# Patient Record
Sex: Male | Born: 1955
Health system: Southern US, Community
[De-identification: ages and names within clinical notes are randomized; demographics above are authoritative.]

## PROBLEM LIST (undated history)

## (undated) DIAGNOSIS — G4730 Sleep apnea, unspecified: Secondary | ICD-10-CM

## (undated) DIAGNOSIS — I4891 Unspecified atrial fibrillation: Secondary | ICD-10-CM

## (undated) DIAGNOSIS — M1990 Unspecified osteoarthritis, unspecified site: Secondary | ICD-10-CM

## (undated) DIAGNOSIS — E119 Type 2 diabetes mellitus without complications: Secondary | ICD-10-CM

## (undated) DIAGNOSIS — I10 Essential (primary) hypertension: Secondary | ICD-10-CM

## (undated) DIAGNOSIS — T7840XA Allergy, unspecified, initial encounter: Secondary | ICD-10-CM

## (undated) DIAGNOSIS — E785 Hyperlipidemia, unspecified: Secondary | ICD-10-CM

## (undated) DIAGNOSIS — R011 Cardiac murmur, unspecified: Secondary | ICD-10-CM

## (undated) DIAGNOSIS — I4892 Unspecified atrial flutter: Secondary | ICD-10-CM

## (undated) HISTORY — DX: Sleep apnea, unspecified: G47.30

## (undated) HISTORY — DX: Type 2 diabetes mellitus without complications: E11.9

## (undated) HISTORY — DX: Unspecified atrial fibrillation: I48.91

## (undated) HISTORY — PX: POLYPECTOMY: SHX149

## (undated) HISTORY — DX: Cardiac murmur, unspecified: R01.1

## (undated) HISTORY — DX: Allergy, unspecified, initial encounter: T78.40XA

## (undated) HISTORY — DX: Unspecified atrial flutter: I48.92

## (undated) HISTORY — DX: Essential (primary) hypertension: I10

## (undated) HISTORY — DX: Unspecified osteoarthritis, unspecified site: M19.90

## (undated) HISTORY — DX: Hyperlipidemia, unspecified: E78.5

## (undated) HISTORY — PX: COLONOSCOPY: SHX174

---

## 2000-10-18 ENCOUNTER — Encounter: Payer: Self-pay | Admitting: *Deleted

## 2000-10-18 ENCOUNTER — Encounter: Admission: RE | Admit: 2000-10-18 | Discharge: 2000-10-18 | Payer: Self-pay | Admitting: *Deleted

## 2001-11-23 ENCOUNTER — Emergency Department (HOSPITAL_COMMUNITY): Admission: EM | Admit: 2001-11-23 | Discharge: 2001-11-24 | Payer: Self-pay | Admitting: Emergency Medicine

## 2001-11-24 ENCOUNTER — Encounter: Payer: Self-pay | Admitting: Emergency Medicine

## 2002-09-05 ENCOUNTER — Encounter: Payer: Self-pay | Admitting: *Deleted

## 2002-09-05 ENCOUNTER — Encounter: Admission: RE | Admit: 2002-09-05 | Discharge: 2002-09-05 | Payer: Self-pay | Admitting: *Deleted

## 2008-09-11 LAB — HM COLONOSCOPY

## 2010-06-19 ENCOUNTER — Emergency Department (HOSPITAL_COMMUNITY): Admission: EM | Admit: 2010-06-19 | Discharge: 2010-06-19 | Payer: Self-pay | Admitting: Emergency Medicine

## 2010-06-24 ENCOUNTER — Encounter: Admission: RE | Admit: 2010-06-24 | Discharge: 2010-06-24 | Payer: Self-pay | Admitting: Sports Medicine

## 2010-12-16 LAB — GLUCOSE, CAPILLARY: Glucose-Capillary: 156 mg/dL — ABNORMAL HIGH (ref 70–99)

## 2013-03-08 ENCOUNTER — Other Ambulatory Visit (INDEPENDENT_AMBULATORY_CARE_PROVIDER_SITE_OTHER): Payer: 59

## 2013-03-08 DIAGNOSIS — E785 Hyperlipidemia, unspecified: Secondary | ICD-10-CM

## 2013-03-08 DIAGNOSIS — E119 Type 2 diabetes mellitus without complications: Secondary | ICD-10-CM

## 2013-03-08 DIAGNOSIS — I10 Essential (primary) hypertension: Secondary | ICD-10-CM

## 2013-03-08 LAB — COMPLETE METABOLIC PANEL WITH GFR
ALT: 36 U/L (ref 0–53)
AST: 20 U/L (ref 0–37)
Alkaline Phosphatase: 53 U/L (ref 39–117)
CO2: 27 mEq/L (ref 19–32)
Creat: 0.82 mg/dL (ref 0.50–1.35)
GFR, Est African American: >89 mL/min
Sodium: 141 mEq/L (ref 135–145)
Total Bilirubin: 0.7 mg/dL (ref 0.3–1.2)
Total Protein: 6.4 g/dL (ref 6.0–8.3)

## 2013-03-08 LAB — LIPID PANEL
HDL: 33 mg/dL — ABNORMAL LOW (ref >39)
LDL Cholesterol: 66 mg/dL (ref 0–99)
Total CHOL/HDL Ratio: 3.5 Ratio
Triglycerides: 75 mg/dL (ref <150)
VLDL: 15 mg/dL (ref 0–40)

## 2013-03-08 LAB — HEMOGLOBIN A1C: Mean Plasma Glucose: 197 mg/dL — ABNORMAL HIGH (ref <117)

## 2013-03-14 ENCOUNTER — Ambulatory Visit (INDEPENDENT_AMBULATORY_CARE_PROVIDER_SITE_OTHER): Payer: 59 | Admitting: Family Medicine

## 2013-03-14 ENCOUNTER — Encounter: Payer: Self-pay | Admitting: Family Medicine

## 2013-03-14 VITALS — BP 118/80 | HR 68 | Temp 98.1°F | Resp 18 | Wt 254.0 lb

## 2013-03-14 DIAGNOSIS — E119 Type 2 diabetes mellitus without complications: Secondary | ICD-10-CM | POA: Insufficient documentation

## 2013-03-14 DIAGNOSIS — E785 Hyperlipidemia, unspecified: Secondary | ICD-10-CM

## 2013-03-14 DIAGNOSIS — IMO0001 Reserved for inherently not codable concepts without codable children: Secondary | ICD-10-CM

## 2013-03-14 DIAGNOSIS — I10 Essential (primary) hypertension: Secondary | ICD-10-CM

## 2013-03-14 MED ORDER — SITAGLIPTIN PHOSPHATE 100 MG PO TABS: 100.0000 mg | ORAL_TABLET | Freq: Every day | ORAL | Status: DC

## 2013-03-14 MED ORDER — CLOTRIMAZOLE-BETAMETHASONE 1-0.05 % EX CREA: TOPICAL_CREAM | Freq: Two times a day (BID) | CUTANEOUS | Status: DC

## 2013-03-14 NOTE — Progress Notes (Signed)
Subjective:    Patient ID: Todd Ellis, male    DOB: [birth year], 57 y.o.   MRN: [id number]  HPI Patient is here today for followup of his medical problems. Problem number one is diabetes mellitus he is currently on metformin 1000 mg by mouth twice a day his hemoglobin A1c has recently risen from 6.5-8.5.  He admits that he has been making for dietary choices lately. He also has not been exercising the way he should.    Problem #2 is hypertension. He is currently taking Norvasc 10 mg by mouth daily and Hyzaar 100/25 by mouth daily. He denies any chest pain, shortness of breath, dyspnea on exertion.   Problem #3 is hyperlipidemia. He is currently taking Crestor 20 mg by mouth daily he denies any myalgias or right upper quadrant pain. His labs are listed below: Lab on 03/08/2013  Component Date Value Range Status  . Cholesterol 03/08/2013 114  0 - 200 mg/dL Final   Comment: ATP III Classification:                                < 200        mg/dL        Desirable                               200 - 239     mg/dL        Borderline High                               >= 240        mg/dL        High                             . Triglycerides 03/08/2013 75  <150 mg/dL Final  . HDL 40/98/1191 33* >39 mg/dL Final  . Total CHOL/HDL Ratio 03/08/2013 3.5   Final  . VLDL 03/08/2013 15  0 - 40 mg/dL Final  . LDL Cholesterol 03/08/2013 66  0 - 99 mg/dL Final   Comment:                            Total Cholesterol/HDL Ratio:CHD Risk                                                 Coronary Heart Disease Risk Table                                                                 Men       Women                                   1/2 Average Risk  3.4        3.3                                       Average Risk              5.0        4.4                                    2X Average Risk              9.6        7.1                                    3X Average Risk             23.4        11.0                          Use the calculated Patient Ratio above and the CHD Risk table                           to determine the patient's CHD Risk.                          ATP III Classification (LDL):                                < 100        mg/dL         Optimal                               100 - 129     mg/dL         Near or Above Optimal                               130 - 159     mg/dL         Borderline High                               160 - 189     mg/dL         High                                > 190        mg/dL         Very High                             . Sodium 03/08/2013 141  135 - 145 mEq/L Final  . Potassium 03/08/2013 4.5  3.5 - 5.3 mEq/L Final  . Chloride 03/08/2013 103  96 - 112 mEq/L Final  . CO2 03/08/2013 27  19 -  32 mEq/L Final  . Glucose, Bld 03/08/2013 155* 70 - 99 mg/dL Final  . BUN 60/45/4098 13  6 - 23 mg/dL Final  . Creat 11/91/4782 0.82  0.50 - 1.35 mg/dL Final  . Total Bilirubin 03/08/2013 0.7  0.3 - 1.2 mg/dL Final  . Alkaline Phosphatase 03/08/2013 53  39 - 117 U/L Final  . AST 03/08/2013 20  0 - 37 U/L Final  . ALT 03/08/2013 36  0 - 53 U/L Final  . Total Protein 03/08/2013 6.4  6.0 - 8.3 g/dL Final  . Albumin 95/62/1308 4.6  3.5 - 5.2 g/dL Final  . Calcium 65/78/4696 9.6  8.4 - 10.5 mg/dL Final  . GFR, Est African American 03/08/2013 >89   Final  . GFR, Est Non African American 03/08/2013 >89   Final   Comment:                            The estimated GFR is a calculation valid for adults (>=32 years old)                          that uses the CKD-EPI algorithm to adjust for age and sex. It is                            not to be used for children, pregnant women, hospitalized patients,                             patients on dialysis, or with rapidly changing kidney function.                          According to the NKDEP, eGFR >89 is normal, 60-89 shows mild                          impairment, 30-59 shows moderate impairment,  15-29 shows severe                          impairment and <15 is ESRD.                             Marland Kitchen Hemoglobin A1C 03/08/2013 8.5* <5.7 % Final   Comment:                                                                                                 According to the ADA Clinical Practice Recommendations for 2011, when                          HbA1c is used as a screening test:                                                       >=  6.5%   Diagnostic of Diabetes Mellitus                                     (if abnormal result is confirmed)                                                     5.7-6.4%   Increased risk of developing Diabetes Mellitus                                                     References:Diagnosis and Classification of Diabetes Mellitus,Diabetes                          Care,2011,34(Suppl 1):S62-S69 and Standards of Medical Care in                                  Diabetes - 2011,Diabetes Care,2011,34 (Suppl 1):S11-S61.                             . Mean Plasma Glucose 03/08/2013 197* <117 mg/dL Final   Past Medical History  Diagnosis Date  . Hyperlipidemia   . Hypertension   . Diabetes mellitus without complication    No current outpatient prescriptions on file prior to visit.   No current facility-administered medications on file prior to visit.   Allergies  Allergen Reactions  . Lipitor (Atorvastatin)     Myalgias  . Penicillins Rash      Review of Systems  All other systems reviewed and are negative.       Objective:   Physical Exam  Vitals reviewed. Constitutional: He appears well-developed and well-nourished.  Neck: Neck supple. No JVD present. No thyromegaly present.  Cardiovascular: Normal rate and regular rhythm.   Murmur heard. Pulmonary/Chest: Effort normal and breath sounds normal. No respiratory distress. He has no wheezes. He has no rales. He exhibits no tenderness.  Abdominal: Soft. Bowel sounds are normal. He exhibits no  distension and no mass. There is no tenderness. There is no rebound and no guarding.  Lymphadenopathy:    He has no cervical adenopathy.  diabetic foot exam is performed.        Assessment & Plan:   1. Other and unspecified hyperlipidemia Cholesterol is currently well controlled. Recommended increasing aerobic exercise and try to lose 10-15 lbs.  2. Unspecified essential hypertension Blood pressures currently well controlled. Continue current medications at present dosages. 3. Type II or unspecified type diabetes mellitus without mention of complication, uncontrolled Begin Januvia 100 mg by mouth daily. Recheck hemoglobin A1c in 3 months. Continue aspirin 81 mg by mouth daily. Recommended diabetic eye exam.

## 2013-05-12 ENCOUNTER — Other Ambulatory Visit: Payer: Self-pay | Admitting: Family Medicine

## 2013-05-13 NOTE — Telephone Encounter (Signed)
Meds refilled.

## 2013-05-24 ENCOUNTER — Other Ambulatory Visit: Payer: Self-pay | Admitting: Family Medicine

## 2013-05-24 DIAGNOSIS — R9431 Abnormal electrocardiogram [ECG] [EKG]: Secondary | ICD-10-CM

## 2013-05-24 DIAGNOSIS — I10 Essential (primary) hypertension: Secondary | ICD-10-CM

## 2013-05-24 DIAGNOSIS — E119 Type 2 diabetes mellitus without complications: Secondary | ICD-10-CM

## 2013-05-24 DIAGNOSIS — E785 Hyperlipidemia, unspecified: Secondary | ICD-10-CM

## 2013-08-06 ENCOUNTER — Other Ambulatory Visit: Payer: 59

## 2013-08-06 DIAGNOSIS — Z79899 Other long term (current) drug therapy: Secondary | ICD-10-CM

## 2013-08-06 DIAGNOSIS — E785 Hyperlipidemia, unspecified: Secondary | ICD-10-CM

## 2013-08-06 DIAGNOSIS — E119 Type 2 diabetes mellitus without complications: Secondary | ICD-10-CM

## 2013-08-06 DIAGNOSIS — I10 Essential (primary) hypertension: Secondary | ICD-10-CM

## 2013-08-06 LAB — COMPLETE METABOLIC PANEL WITH GFR
Alkaline Phosphatase: 56 U/L (ref 39–117)
BUN: 12 mg/dL (ref 6–23)
Creat: 0.78 mg/dL (ref 0.50–1.35)
GFR, Est African American: >89 mL/min
GFR, Est Non African American: >89 mL/min
Glucose, Bld: 134 mg/dL — ABNORMAL HIGH (ref 70–99)
Total Bilirubin: 0.5 mg/dL (ref 0.3–1.2)

## 2013-08-06 LAB — LIPID PANEL
HDL: 42 mg/dL (ref >39)
Total CHOL/HDL Ratio: 3 Ratio
Triglycerides: 57 mg/dL (ref <150)

## 2013-08-06 LAB — CBC WITH DIFFERENTIAL/PLATELET
Basophils Relative: 0 % (ref 0–1)
Eosinophils Absolute: 0.1 10*3/uL (ref 0.0–0.7)
Eosinophils Relative: 1 % (ref 0–5)
HCT: 43.9 % (ref 39.0–52.0)
Hemoglobin: 15.1 g/dL (ref 13.0–17.0)
MCH: 29.8 pg (ref 26.0–34.0)
MCHC: 34.4 g/dL (ref 30.0–36.0)
MCV: 86.8 fL (ref 78.0–100.0)
Monocytes Absolute: 0.6 10*3/uL (ref 0.1–1.0)
Monocytes Relative: 10 % (ref 3–12)
Neutrophils Relative %: 66 % (ref 43–77)

## 2013-08-06 LAB — HEMOGLOBIN A1C: Hgb A1c MFr Bld: 7.1 % — ABNORMAL HIGH (ref <5.7)

## 2013-08-07 ENCOUNTER — Other Ambulatory Visit: Payer: Self-pay | Admitting: Family Medicine

## 2013-08-07 MED ORDER — LOSARTAN POTASSIUM-HCTZ 100-25 MG PO TABS: 1.0000 | ORAL_TABLET | Freq: Every day | ORAL | Status: DC

## 2013-08-07 NOTE — Telephone Encounter (Signed)
Rx Refilled  

## 2013-09-20 ENCOUNTER — Encounter: Payer: Self-pay | Admitting: Family Medicine

## 2013-11-05 ENCOUNTER — Other Ambulatory Visit: Payer: Self-pay | Admitting: Family Medicine

## 2013-11-05 MED ORDER — ROSUVASTATIN CALCIUM 40 MG PO TABS: 20.0000 mg | ORAL_TABLET | Freq: Every day | ORAL | Status: DC

## 2013-11-05 NOTE — Telephone Encounter (Signed)
Rx Refilled  

## 2013-11-07 ENCOUNTER — Telehealth: Payer: Self-pay | Admitting: Family Medicine

## 2013-11-07 NOTE — Telephone Encounter (Signed)
Pt states he is needing an OV for his Losartan to be refilled and he is wanting to know why since all of his other meds were filled without needing a OV Call back number [phone]

## 2013-11-08 MED ORDER — LOSARTAN POTASSIUM-HCTZ 100-25 MG PO TABS: 1.0000 | ORAL_TABLET | Freq: Every day | ORAL | Status: DC

## 2013-11-08 NOTE — Telephone Encounter (Signed)
Not sure why unless pharmacy sent to wrong md. I have not seen any rx for him nor is it in the computer that we received a request. Refilled medication and pt will require ov and bw before further refills. Pt is aware per vm.

## 2013-11-11 ENCOUNTER — Other Ambulatory Visit: Payer: Self-pay | Admitting: Family Medicine

## 2013-11-11 MED ORDER — LOSARTAN POTASSIUM-HCTZ 100-25 MG PO TABS: 1.0000 | ORAL_TABLET | Freq: Every day | ORAL | Status: DC

## 2013-11-11 NOTE — Telephone Encounter (Signed)
Rx Refilled  

## 2013-12-13 LAB — HM DIABETES EYE EXAM

## 2014-01-24 ENCOUNTER — Ambulatory Visit (INDEPENDENT_AMBULATORY_CARE_PROVIDER_SITE_OTHER): Payer: 59 | Admitting: Family Medicine

## 2014-01-24 ENCOUNTER — Other Ambulatory Visit: Payer: 59

## 2014-01-24 ENCOUNTER — Encounter: Payer: Self-pay | Admitting: Family Medicine

## 2014-01-24 VITALS — BP 130/76 | HR 78 | Temp 97.0°F | Resp 18 | Ht 76.0 in | Wt 255.0 lb

## 2014-01-24 DIAGNOSIS — J329 Chronic sinusitis, unspecified: Secondary | ICD-10-CM

## 2014-01-24 DIAGNOSIS — E785 Hyperlipidemia, unspecified: Secondary | ICD-10-CM

## 2014-01-24 DIAGNOSIS — Z9109 Other allergy status, other than to drugs and biological substances: Secondary | ICD-10-CM

## 2014-01-24 DIAGNOSIS — I10 Essential (primary) hypertension: Secondary | ICD-10-CM

## 2014-01-24 DIAGNOSIS — E119 Type 2 diabetes mellitus without complications: Secondary | ICD-10-CM

## 2014-01-24 DIAGNOSIS — Z79899 Other long term (current) drug therapy: Secondary | ICD-10-CM

## 2014-01-24 LAB — COMPLETE METABOLIC PANEL WITH GFR
ALK PHOS: 61 U/L (ref 39–117)
ALT: 36 U/L (ref 0–53)
AST: 17 U/L (ref 0–37)
Albumin: 4.2 g/dL (ref 3.5–5.2)
BUN: 13 mg/dL (ref 6–23)
CALCIUM: 9.4 mg/dL (ref 8.4–10.5)
CHLORIDE: 101 meq/L (ref 96–112)
CO2: 26 mEq/L (ref 19–32)
Creat: 0.77 mg/dL (ref 0.50–1.35)
GFR, Est African American: >89 mL/min
GFR, Est Non African American: >89 mL/min
Glucose, Bld: 140 mg/dL — ABNORMAL HIGH (ref 70–99)
POTASSIUM: 4.3 meq/L (ref 3.5–5.3)
Sodium: 139 mEq/L (ref 135–145)
Total Bilirubin: 0.6 mg/dL (ref 0.2–1.2)
Total Protein: 6.5 g/dL (ref 6.0–8.3)

## 2014-01-24 LAB — LIPID PANEL
Cholesterol: 127 mg/dL (ref 0–200)
HDL: 36 mg/dL — ABNORMAL LOW (ref >39)
LDL Cholesterol: 78 mg/dL (ref 0–99)
Total CHOL/HDL Ratio: 3.5 Ratio
Triglycerides: 65 mg/dL (ref <150)
VLDL: 13 mg/dL (ref 0–40)

## 2014-01-24 LAB — HEMOGLOBIN A1C
Hgb A1c MFr Bld: 7.9 % — ABNORMAL HIGH (ref <5.7)
Mean Plasma Glucose: 180 mg/dL — ABNORMAL HIGH (ref <117)

## 2014-01-24 MED ORDER — SITAGLIPTIN PHOSPHATE 100 MG PO TABS: ORAL_TABLET | ORAL | Status: DC

## 2014-01-24 MED ORDER — AZITHROMYCIN 250 MG PO TABS: ORAL_TABLET | ORAL | Status: DC

## 2014-01-24 MED ORDER — PREDNISONE 20 MG PO TABS: ORAL_TABLET | ORAL | Status: DC

## 2014-01-24 MED ORDER — ROSUVASTATIN CALCIUM 40 MG PO TABS: 20.0000 mg | ORAL_TABLET | Freq: Every day | ORAL | Status: DC

## 2014-01-24 MED ORDER — FLUTICASONE PROPIONATE 50 MCG/ACT NA SUSP: NASAL | Status: DC

## 2014-01-24 MED ORDER — METFORMIN HCL 1000 MG PO TABS: ORAL_TABLET | ORAL | Status: DC

## 2014-01-24 MED ORDER — AMLODIPINE BESYLATE 10 MG PO TABS: ORAL_TABLET | ORAL | Status: DC

## 2014-01-24 MED ORDER — LOSARTAN POTASSIUM-HCTZ 100-25 MG PO TABS: 1.0000 | ORAL_TABLET | Freq: Every day | ORAL | Status: DC

## 2014-01-24 NOTE — Progress Notes (Signed)
   Subjective:    Patient ID: Todd Ellis, male    DOB: 1956-03-11, 58 y.o.   MRN: [id number]  HPI  Patient has had bilateral sinus pressure for several weeks unresponsive to Flonase and over-the-counter allergy medicine. He denies any fevers or chills. He denies any sinus pain. He does have occasional pain in his teeth. He also has occasional headache. He denies any cough or shortness of breath worse throat otalgia Past Medical History  Diagnosis Date  . Hyperlipidemia   . Hypertension   . Diabetes mellitus without complication    Current Outpatient Prescriptions on File Prior to Visit  Medication Sig Dispense Refill  . aspirin 81 MG tablet Take 81 mg by mouth daily.      . clotrimazole-betamethasone (LOTRISONE) cream Apply topically 2 (two) times daily. For 7 days  30 g  2   No current facility-administered medications on file prior to visit.   Allergies  Allergen Reactions  . Lipitor [Atorvastatin]     Myalgias  . Penicillins Rash   History   Social History  . Marital Status: Single    Spouse Name: N/A    Number of Children: N/A  . Years of Education: N/A   Occupational History  . Not on file.   Social History Main Topics  . Smoking status: Never Smoker   . Smokeless tobacco: Former Systems developer    Types: Chew     Comment: quit 2002  . Alcohol Use: Yes     Comment: Occasional  . Drug Use: No  . Sexual Activity: Not on file   Other Topics Concern  . Not on file   Social History Narrative  . No narrative on file     Review of Systems  All other systems reviewed and are negative.      Objective:   Physical Exam  Vitals reviewed. Constitutional: He appears well-developed and well-nourished.  HENT:  Right Ear: Tympanic membrane, external ear and ear canal normal.  Left Ear: Tympanic membrane, external ear and ear canal normal.  Nose: Mucosal edema and rhinorrhea present. Right sinus exhibits no maxillary sinus tenderness and no frontal sinus tenderness.  Left sinus exhibits no maxillary sinus tenderness and no frontal sinus tenderness.  Mouth/Throat: Oropharynx is clear and moist.  Eyes: Conjunctivae are normal. No scleral icterus.  Neck: Neck supple. No JVD present. No thyromegaly present.  Cardiovascular: Normal rate, regular rhythm and normal heart sounds.  Exam reveals no gallop.   No murmur heard. Pulmonary/Chest: Effort normal and breath sounds normal. No respiratory distress. He has no wheezes. He has no rales.  Lymphadenopathy:    He has no cervical adenopathy.          Assessment & Plan:  1. Environmental allergies Denies any evidence of a sinusitis at the present time. Therefore, I will treat his allergies with a prednisone taper pack and she is to consider options. If he develops fever, sinus pain, or jaw pain/teeth pain, I would use a Z-Pak to treat sinusitis - predniSONE (DELTASONE) 20 MG tablet; 3 tabs poqday 1-2, 2 tabs poqday 3-4, 1 tab poqday 5-6  Dispense: 12 tablet; Refill: 0  2. Unspecified sinusitis (chronic) Do not give antibiotics unless the patient develops symptoms of a sinus infection including fever, sinus pain, or teeth pain - azithromycin (ZITHROMAX) 250 MG tablet; 2 tabs poqday1, 1 tab poqday 2-5  Dispense: 6 tablet; Refill: 0

## 2014-03-21 ENCOUNTER — Other Ambulatory Visit: Payer: Self-pay | Admitting: Family Medicine

## 2014-03-21 ENCOUNTER — Telehealth: Payer: Self-pay | Admitting: Family Medicine

## 2014-03-21 MED ORDER — CANAGLIFLOZIN 300 MG PO TABS: 300.0000 mg | ORAL_TABLET | Freq: Every day | ORAL | Status: DC

## 2014-03-21 NOTE — Telephone Encounter (Signed)
Pt came in wanting a card for a medication that [facility] was to put him on after his last lab work was done. Pt did not know the name of the medication just that it was new and he has not been taking it. I looked back in chart and in his 4/15 labs it states that he should begin Invokana 300mg  qd and recheck BW in 3 months. Pt was given a card for 1 year free and rx sent to pharm and he is aware that he needs to be seen in 3 months.

## 2014-05-02 ENCOUNTER — Other Ambulatory Visit: Payer: 59

## 2014-05-02 ENCOUNTER — Encounter: Payer: Self-pay | Admitting: Family Medicine

## 2014-05-02 ENCOUNTER — Other Ambulatory Visit: Payer: Self-pay | Admitting: Family Medicine

## 2014-05-02 DIAGNOSIS — E119 Type 2 diabetes mellitus without complications: Secondary | ICD-10-CM

## 2014-05-02 DIAGNOSIS — I10 Essential (primary) hypertension: Secondary | ICD-10-CM

## 2014-05-02 DIAGNOSIS — E785 Hyperlipidemia, unspecified: Secondary | ICD-10-CM

## 2014-05-02 LAB — COMPLETE METABOLIC PANEL WITH GFR
ALT: 28 U/L (ref 0–53)
AST: 19 U/L (ref 0–37)
Albumin: 4.5 g/dL (ref 3.5–5.2)
Alkaline Phosphatase: 47 U/L (ref 39–117)
BILIRUBIN TOTAL: 0.9 mg/dL (ref 0.2–1.2)
BUN: 18 mg/dL (ref 6–23)
CO2: 27 mEq/L (ref 19–32)
Calcium: 9.9 mg/dL (ref 8.4–10.5)
Chloride: 101 mEq/L (ref 96–112)
Creat: 0.9 mg/dL (ref 0.50–1.35)
GFR, Est Non African American: >89 mL/min
Glucose, Bld: 110 mg/dL — ABNORMAL HIGH (ref 70–99)
Potassium: 4.6 mEq/L (ref 3.5–5.3)
SODIUM: 139 meq/L (ref 135–145)
Total Protein: 6.7 g/dL (ref 6.0–8.3)

## 2014-05-02 LAB — HEMOGLOBIN A1C
Hgb A1c MFr Bld: 7.4 % — ABNORMAL HIGH (ref <5.7)
MEAN PLASMA GLUCOSE: 166 mg/dL — AB (ref <117)

## 2014-05-02 LAB — LIPID PANEL
Cholesterol: 128 mg/dL (ref 0–200)
HDL: 38 mg/dL — ABNORMAL LOW (ref >39)
LDL Cholesterol: 75 mg/dL (ref 0–99)
TRIGLYCERIDES: 75 mg/dL (ref <150)
Total CHOL/HDL Ratio: 3.4 Ratio
VLDL: 15 mg/dL (ref 0–40)

## 2014-05-02 LAB — CBC WITH DIFFERENTIAL/PLATELET
BASOS ABS: 0 10*3/uL (ref 0.0–0.1)
Basophils Relative: 0 % (ref 0–1)
Eosinophils Absolute: 0.1 10*3/uL (ref 0.0–0.7)
Eosinophils Relative: 2 % (ref 0–5)
HEMATOCRIT: 45.5 % (ref 39.0–52.0)
Hemoglobin: 15.9 g/dL (ref 13.0–17.0)
LYMPHS ABS: 1.4 10*3/uL (ref 0.7–4.0)
Lymphocytes Relative: 20 % (ref 12–46)
MCH: 29.7 pg (ref 26.0–34.0)
MCHC: 34.9 g/dL (ref 30.0–36.0)
MCV: 85 fL (ref 78.0–100.0)
MONO ABS: 0.6 10*3/uL (ref 0.1–1.0)
Monocytes Relative: 9 % (ref 3–12)
NEUTROS ABS: 4.8 10*3/uL (ref 1.7–7.7)
Neutrophils Relative %: 69 % (ref 43–77)
Platelets: 225 10*3/uL (ref 150–400)
RBC: 5.35 MIL/uL (ref 4.22–5.81)
RDW: 12.9 % (ref 11.5–15.5)
WBC: 6.9 10*3/uL (ref 4.0–10.5)

## 2014-05-02 NOTE — Telephone Encounter (Signed)
Medication refill for one time only.  Patient needs to be seen.  Letter sent for patient to call and schedule 

## 2014-05-05 ENCOUNTER — Ambulatory Visit (INDEPENDENT_AMBULATORY_CARE_PROVIDER_SITE_OTHER): Payer: 59 | Admitting: Family Medicine

## 2014-05-05 ENCOUNTER — Encounter: Payer: Self-pay | Admitting: Family Medicine

## 2014-05-05 VITALS — BP 110/70 | HR 68 | Temp 97.0°F | Resp 18 | Ht 76.0 in | Wt 252.0 lb

## 2014-05-05 DIAGNOSIS — I10 Essential (primary) hypertension: Secondary | ICD-10-CM

## 2014-05-05 DIAGNOSIS — IMO0001 Reserved for inherently not codable concepts without codable children: Secondary | ICD-10-CM

## 2014-05-05 DIAGNOSIS — E785 Hyperlipidemia, unspecified: Secondary | ICD-10-CM

## 2014-05-05 DIAGNOSIS — E1165 Type 2 diabetes mellitus with hyperglycemia: Principal | ICD-10-CM

## 2014-05-05 NOTE — Progress Notes (Signed)
Subjective:    Patient ID: Todd Ellis, male    DOB: 07/11/1956, 58 y.o.   MRN: [id number]  HPI Patient is here today for followup of his hypertension, hyperlipidemia, and diabetes mellitus type 2. His most recent lab work is listed below: Lab on 05/02/2014  Component Date Value Ref Range Status  . Cholesterol 05/02/2014 128  0 - 200 mg/dL Final   Comment: ATP III Classification:                                < 200        mg/dL        Desirable                               200 - 239     mg/dL        Borderline High                               >= 240        mg/dL        High                             . Triglycerides 05/02/2014 75  <150 mg/dL Final  . HDL 05/02/2014 38* >39 mg/dL Final  . Total CHOL/HDL Ratio 05/02/2014 3.4   Final  . VLDL 05/02/2014 15  0 - 40 mg/dL Final  . LDL Cholesterol 05/02/2014 75  0 - 99 mg/dL Final   Comment:                            Total Cholesterol/HDL Ratio:CHD Risk                                                 Coronary Heart Disease Risk Table                                                                 Men       Women                                   1/2 Average Risk              3.4        3.3                                       Average Risk              5.0        4.4  2X Average Risk              9.6        7.1                                    3X Average Risk             23.4       11.0                          Use the calculated Patient Ratio above and the CHD Risk table                           to determine the patient's CHD Risk.                          ATP III Classification (LDL):                                < 100        mg/dL         Optimal                               100 - 129     mg/dL         Near or Above Optimal                               130 - 159     mg/dL         Borderline High                               160 - 189     mg/dL         High         > 190        mg/dL         Very High                             . Hemoglobin A1C 05/02/2014 7.4* <5.7 % Final   Comment:                                                                                                 According to the ADA Clinical Practice Recommendations for 2011, when                          HbA1c is used as a screening test:                                                       >=  6.5%   Diagnostic of Diabetes Mellitus                                     (if abnormal result is confirmed)                                                     5.7-6.4%   Increased risk of developing Diabetes Mellitus                                                     References:Diagnosis and Classification of Diabetes Mellitus,Diabetes                          TLXB,2620,35(DHRCB 1):S62-S69 and Standards of Medical Care in                                  Diabetes - 2011,Diabetes Care,2011,34 (Suppl 1):S11-S61.                             . Mean Plasma Glucose 05/02/2014 166* <117 mg/dL Final  . WBC 05/02/2014 6.9  4.0 - 10.5 K/uL Final  . RBC 05/02/2014 5.35  4.22 - 5.81 MIL/uL Final  . Hemoglobin 05/02/2014 15.9  13.0 - 17.0 g/dL Final  . HCT 05/02/2014 45.5  39.0 - 52.0 % Final  . MCV 05/02/2014 85.0  78.0 - 100.0 fL Final  . MCH 05/02/2014 29.7  26.0 - 34.0 pg Final  . MCHC 05/02/2014 34.9  30.0 - 36.0 g/dL Final  . RDW 05/02/2014 12.9  11.5 - 15.5 % Final  . Platelets 05/02/2014 225  150 - 400 K/uL Final  . Neutrophils Relative % 05/02/2014 69  43 - 77 % Final  . Neutro Abs 05/02/2014 4.8  1.7 - 7.7 K/uL Final  . Lymphocytes Relative 05/02/2014 20  12 - 46 % Final  . Lymphs Abs 05/02/2014 1.4  0.7 - 4.0 K/uL Final  . Monocytes Relative 05/02/2014 9  3 - 12 % Final  . Monocytes Absolute 05/02/2014 0.6  0.1 - 1.0 K/uL Final  . Eosinophils Relative 05/02/2014 2  0 - 5 % Final  . Eosinophils Absolute 05/02/2014 0.1  0.0 - 0.7 K/uL Final  . Basophils Relative 05/02/2014 0  0 - 1  % Final  . Basophils Absolute 05/02/2014 0.0  0.0 - 0.1 K/uL Final  . Smear Review 05/02/2014 Criteria for review not met   Final  . Sodium 05/02/2014 139  135 - 145 mEq/L Final  . Potassium 05/02/2014 4.6  3.5 - 5.3 mEq/L Final  . Chloride 05/02/2014 101  96 - 112 mEq/L Final  . CO2 05/02/2014 27  19 - 32 mEq/L Final  . Glucose, Bld 05/02/2014 110* 70 - 99 mg/dL Final  . BUN 05/02/2014 18  6 - 23 mg/dL Final  . Creat 05/02/2014 0.90  0.50 - 1.35 mg/dL Final  . Total Bilirubin 05/02/2014 0.9  0.2 - 1.2 mg/dL Final  .  Alkaline Phosphatase 05/02/2014 47  39 - 117 U/L Final  . AST 05/02/2014 19  0 - 37 U/L Final  . ALT 05/02/2014 28  0 - 53 U/L Final  . Total Protein 05/02/2014 6.7  6.0 - 8.3 g/dL Final  . Albumin 05/02/2014 4.5  3.5 - 5.2 g/dL Final  . Calcium 05/02/2014 9.9  8.4 - 10.5 mg/dL Final  . GFR, Est African American 05/02/2014 >89   Final  . GFR, Est Non African American 05/02/2014 >89   Final   Comment:                            The estimated GFR is a calculation valid for adults (>=35 years old)                          that uses the CKD-EPI algorithm to adjust for age and sex. It is                            not to be used for children, pregnant women, hospitalized patients,                             patients on dialysis, or with rapidly changing kidney function.                          According to the NKDEP, eGFR >89 is normal, 60-89 shows mild                          impairment, 30-59 shows moderate impairment, 15-29 shows severe                          impairment and <15 is ESRD.                              Patient's blood pressure is excellent. He denies any chest pain shortness of breath or dyspnea on exertion. He denies any myalgias or right quadrant pain on Crestor. His cholesterol is excellent. Patient has only been taking invokana for 1 month.  His hemoglobin A1c has improved from 7.9-7.4. Unfortunately this still is elevated. He denies polyuria, polydipsia,  or blurred vision. His diabetic eye exam is up to date. Unfortunately he consumes a high carbohydrate diet and does not engage in any regular aerobic exercise. He drinks beer on a daily basis. He eats a lot of bread and potatoes and pasta.. Past Medical History  Diagnosis Date  . Hyperlipidemia   . Hypertension   . Diabetes mellitus without complication    No past surgical history on file. Current Outpatient Prescriptions on File Prior to Visit  Medication Sig Dispense Refill  . amLODipine (NORVASC) 10 MG tablet TAKE 1 TABLET BY MOUTH DAILY  90 tablet  3  . aspirin 81 MG tablet Take 81 mg by mouth daily.      . Canagliflozin 300 MG TABS Take 1 tablet (300 mg total) by mouth daily.  30 tablet  2  . clotrimazole-betamethasone (LOTRISONE) cream Apply topically 2 (two) times daily. For 7 days  30 g  2  . fluticasone (FLONASE) 50 MCG/ACT nasal spray USE 2 SPRAYS IN  EACH NOSTRIL EVERY DAY  1 g  3  . losartan-hydrochlorothiazide (HYZAAR) 100-25 MG per tablet TAKE 1 TABLET BY MOUTH DAILY.  30 tablet  0  . metFORMIN (GLUCOPHAGE) 1000 MG tablet TAKE 1 TABLET BY MOUTH TWICE A DAY  180 tablet  3  . Omega-3 Fatty Acids (FISH OIL) 1200 MG CAPS Take 3 capsules by mouth daily.      . rosuvastatin (CRESTOR) 40 MG tablet Take 0.5 tablets (20 mg total) by mouth daily.  90 tablet  3  . sitaGLIPtin (JANUVIA) 100 MG tablet TAKE 1 TABLET BY MOUTH ONCE A DAY  90 tablet  3   No current facility-administered medications on file prior to visit.   Allergies  Allergen Reactions  . Lipitor [Atorvastatin]     Myalgias  . Penicillins Rash   History   Social History  . Marital Status: Single    Spouse Name: N/A    Number of Children: N/A  . Years of Education: N/A   Occupational History  . Not on file.   Social History Main Topics  . Smoking status: Never Smoker   . Smokeless tobacco: Former Systems developer    Types: Chew     Comment: quit 2002  . Alcohol Use: Yes     Comment: Occasional  . Drug Use: No  .  Sexual Activity: Not on file   Other Topics Concern  . Not on file   Social History Narrative  . No narrative on file      Review of Systems  All other systems reviewed and are negative.      Objective:   Physical Exam  Vitals reviewed. Constitutional: He appears well-developed and well-nourished. No distress.  Neck: Neck supple. No JVD present. No thyromegaly present.  Cardiovascular: Normal rate, regular rhythm and normal heart sounds.  Exam reveals no gallop and no friction rub.   No murmur heard. Pulmonary/Chest: Effort normal and breath sounds normal. No respiratory distress. He has no wheezes. He has no rales.  Abdominal: Soft. Bowel sounds are normal. He exhibits no distension and no mass. There is no tenderness. There is no rebound and no guarding.  Musculoskeletal: He exhibits no edema.  Lymphadenopathy:    He has no cervical adenopathy.  Skin: He is not diaphoretic.          Assessment & Plan:  1. Type II or unspecified type diabetes mellitus without mention of complication, uncontrolled Blood sugars not controlled. I gave  the patient the option between adding Actos 30 mg by mouth daily, having him become very strict with a low carbohydrate diet, getting up alcohol and beer, decreasing his consumption of bread and potatoes and constant, and exercising to try to lose 10-15 pounds. Patient elects to try therapeutic lifestyle changes. Recheck hemoglobin A1c along with urine microalbumin in 6 months.  2. Unspecified essential hypertension Blood pressure is well controlled. I'll make no changes in his medication at this time  3. Other and unspecified hyperlipidemia Cholesterol is excellent. I made no changes in his medication at this time.

## 2014-06-06 ENCOUNTER — Other Ambulatory Visit: Payer: Self-pay | Admitting: Family Medicine

## 2014-06-18 ENCOUNTER — Other Ambulatory Visit: Payer: Self-pay | Admitting: Family Medicine

## 2014-09-07 ENCOUNTER — Other Ambulatory Visit: Payer: Self-pay | Admitting: Family Medicine

## 2014-12-13 ENCOUNTER — Other Ambulatory Visit: Payer: Self-pay | Admitting: Family Medicine

## 2015-01-15 LAB — HM DIABETES EYE EXAM

## 2015-01-16 ENCOUNTER — Other Ambulatory Visit: Payer: 59

## 2015-01-16 ENCOUNTER — Telehealth: Payer: Self-pay | Admitting: Family Medicine

## 2015-01-16 DIAGNOSIS — E1165 Type 2 diabetes mellitus with hyperglycemia: Secondary | ICD-10-CM

## 2015-01-16 DIAGNOSIS — Z125 Encounter for screening for malignant neoplasm of prostate: Secondary | ICD-10-CM

## 2015-01-16 DIAGNOSIS — IMO0002 Reserved for concepts with insufficient information to code with codable children: Secondary | ICD-10-CM

## 2015-01-16 DIAGNOSIS — Z79899 Other long term (current) drug therapy: Secondary | ICD-10-CM

## 2015-01-16 DIAGNOSIS — I10 Essential (primary) hypertension: Secondary | ICD-10-CM

## 2015-01-16 DIAGNOSIS — E785 Hyperlipidemia, unspecified: Secondary | ICD-10-CM

## 2015-01-16 LAB — CBC WITH DIFFERENTIAL/PLATELET
Basophils Absolute: 0 10*3/uL (ref 0.0–0.1)
Basophils Relative: 0 % (ref 0–1)
EOS PCT: 1 % (ref 0–5)
Eosinophils Absolute: 0.1 10*3/uL (ref 0.0–0.7)
HCT: 45.8 % (ref 39.0–52.0)
Hemoglobin: 15.7 g/dL (ref 13.0–17.0)
LYMPHS ABS: 1.4 10*3/uL (ref 0.7–4.0)
LYMPHS PCT: 24 % (ref 12–46)
MCH: 29.9 pg (ref 26.0–34.0)
MCHC: 34.3 g/dL (ref 30.0–36.0)
MCV: 87.2 fL (ref 78.0–100.0)
MONO ABS: 0.5 10*3/uL (ref 0.1–1.0)
MONOS PCT: 8 % (ref 3–12)
MPV: 10.3 fL (ref 8.6–12.4)
NEUTROS ABS: 4 10*3/uL (ref 1.7–7.7)
Neutrophils Relative %: 67 % (ref 43–77)
Platelets: 189 10*3/uL (ref 150–400)
RBC: 5.25 MIL/uL (ref 4.22–5.81)
RDW: 13.1 % (ref 11.5–15.5)
WBC: 5.9 10*3/uL (ref 4.0–10.5)

## 2015-01-16 LAB — COMPLETE METABOLIC PANEL WITH GFR
ALK PHOS: 49 U/L (ref 39–117)
ALT: 18 U/L (ref 0–53)
AST: 14 U/L (ref 0–37)
Albumin: 4.4 g/dL (ref 3.5–5.2)
BILIRUBIN TOTAL: 0.6 mg/dL (ref 0.2–1.2)
BUN: 18 mg/dL (ref 6–23)
CO2: 25 meq/L (ref 19–32)
Calcium: 9.3 mg/dL (ref 8.4–10.5)
Chloride: 104 mEq/L (ref 96–112)
Creat: 0.79 mg/dL (ref 0.50–1.35)
GFR, Est African American: >89 mL/min
GFR, Est Non African American: >89 mL/min
GLUCOSE: 99 mg/dL (ref 70–99)
Potassium: 4.2 mEq/L (ref 3.5–5.3)
Sodium: 138 mEq/L (ref 135–145)
TOTAL PROTEIN: 6.7 g/dL (ref 6.0–8.3)

## 2015-01-16 LAB — HEMOGLOBIN A1C
Hgb A1c MFr Bld: 6.2 % — ABNORMAL HIGH (ref <5.7)
MEAN PLASMA GLUCOSE: 131 mg/dL — AB (ref <117)

## 2015-01-16 LAB — LIPID PANEL
Cholesterol: 133 mg/dL (ref 0–200)
HDL: 44 mg/dL (ref >=40)
LDL Cholesterol: 75 mg/dL (ref 0–99)
Total CHOL/HDL Ratio: 3 Ratio
Triglycerides: 68 mg/dL (ref <150)
VLDL: 14 mg/dL (ref 0–40)

## 2015-01-16 LAB — TSH: TSH: 1.983 u[IU]/mL (ref 0.350–4.500)

## 2015-01-16 MED ORDER — CANAGLIFLOZIN 300 MG PO TABS: 300.0000 mg | ORAL_TABLET | Freq: Every day | ORAL | Status: DC

## 2015-01-16 NOTE — Telephone Encounter (Signed)
[phone]  [facility]  Patient calling to get refill on invokana if possible

## 2015-01-16 NOTE — Telephone Encounter (Signed)
One refill to hold until appt.

## 2015-01-17 LAB — PSA: PSA: 0.93 ng/mL (ref <=4.00)

## 2015-01-23 ENCOUNTER — Encounter: Payer: Self-pay | Admitting: Family Medicine

## 2015-01-23 ENCOUNTER — Ambulatory Visit (INDEPENDENT_AMBULATORY_CARE_PROVIDER_SITE_OTHER): Payer: 59 | Admitting: Family Medicine

## 2015-01-23 VITALS — BP 132/80 | HR 78 | Temp 97.5°F | Resp 16 | Ht 76.0 in | Wt 245.0 lb

## 2015-01-23 DIAGNOSIS — E119 Type 2 diabetes mellitus without complications: Secondary | ICD-10-CM

## 2015-01-23 DIAGNOSIS — E785 Hyperlipidemia, unspecified: Secondary | ICD-10-CM | POA: Diagnosis not present

## 2015-01-23 DIAGNOSIS — I10 Essential (primary) hypertension: Secondary | ICD-10-CM | POA: Diagnosis not present

## 2015-01-23 NOTE — Progress Notes (Signed)
Subjective:    Patient ID: Todd Ellis, male    DOB: [birth year], 59 y.o.   MRN: [id number]  HPI Patient is here today for routine follow-up for his diabetes, hypertension, hyperlipidemia. Patient is diabetic exam was recently performed and was normal. He declines the pneumonia vaccine today. He denies any chest pain shortness of breath dyspnea on exertion myalgias or right upper quadrant pain. He denies any polyuria, polydipsia, or blurred vision. His most recent lab work as listed below: Lab on 01/16/2015  Component Date Value Ref Range Status  . Hgb A1c MFr Bld 01/16/2015 6.2* <5.7 % Final   Comment:                                                                        According to the ADA Clinical Practice Recommendations for 2011, when HbA1c is used as a screening test:     >=6.5%   Diagnostic of Diabetes Mellitus            (if abnormal result is confirmed)   5.7-6.4%   Increased risk of developing Diabetes Mellitus   References:Diagnosis and Classification of Diabetes Mellitus,Diabetes EXBM,8413,24(MWNUU 1):S62-S69 and Standards of Medical Care in         Diabetes - 2011,Diabetes VOZD,6644,03 (Suppl 1):S11-S61.     . Mean Plasma Glucose 01/16/2015 131* <117 mg/dL Final  . WBC 01/16/2015 5.9  4.0 - 10.5 K/uL Final  . RBC 01/16/2015 5.25  4.22 - 5.81 MIL/uL Final  . Hemoglobin 01/16/2015 15.7  13.0 - 17.0 g/dL Final  . HCT 01/16/2015 45.8  39.0 - 52.0 % Final  . MCV 01/16/2015 87.2  78.0 - 100.0 fL Final  . MCH 01/16/2015 29.9  26.0 - 34.0 pg Final  . MCHC 01/16/2015 34.3  30.0 - 36.0 g/dL Final  . RDW 01/16/2015 13.1  11.5 - 15.5 % Final  . Platelets 01/16/2015 189  150 - 400 K/uL Final  . MPV 01/16/2015 10.3  8.6 - 12.4 fL Final  . Neutrophils Relative % 01/16/2015 67  43 - 77 % Final  . Neutro Abs 01/16/2015 4.0  1.7 - 7.7 K/uL Final  . Lymphocytes Relative 01/16/2015 24  12 - 46 % Final  . Lymphs Abs 01/16/2015 1.4  0.7 - 4.0 K/uL Final  . Monocytes Relative  01/16/2015 8  3 - 12 % Final  . Monocytes Absolute 01/16/2015 0.5  0.1 - 1.0 K/uL Final  . Eosinophils Relative 01/16/2015 1  0 - 5 % Final  . Eosinophils Absolute 01/16/2015 0.1  0.0 - 0.7 K/uL Final  . Basophils Relative 01/16/2015 0  0 - 1 % Final  . Basophils Absolute 01/16/2015 0.0  0.0 - 0.1 K/uL Final  . Smear Review 01/16/2015 Criteria for review not met   Final  . Cholesterol 01/16/2015 133  0 - 200 mg/dL Final   Comment: ATP III Classification:       < 200        mg/dL        Desirable      200 - 239     mg/dL        Borderline High      >= 240  mg/dL        High     . Triglycerides 01/16/2015 68  <150 mg/dL Final  . HDL 01/16/2015 44  >=40 mg/dL Final   ** Please note change in reference range(s). **  . Total CHOL/HDL Ratio 01/16/2015 3.0   Final  . VLDL 01/16/2015 14  0 - 40 mg/dL Final  . LDL Cholesterol 01/16/2015 75  0 - 99 mg/dL Final   Comment:   Total Cholesterol/HDL Ratio:CHD Risk                        Coronary Heart Disease Risk Table                                        Men       Women          1/2 Average Risk              3.4        3.3              Average Risk              5.0        4.4           2X Average Risk              9.6        7.1           3X Average Risk             23.4       11.0 Use the calculated Patient Ratio above and the CHD Risk table  to determine the patient's CHD Risk. ATP III Classification (LDL):       < 100        mg/dL         Optimal      100 - 129     mg/dL         Near or Above Optimal      130 - 159     mg/dL         Borderline High      160 - 189     mg/dL         High       > 190        mg/dL         Very High     . Sodium 01/16/2015 138  135 - 145 mEq/L Final  . Potassium 01/16/2015 4.2  3.5 - 5.3 mEq/L Final  . Chloride 01/16/2015 104  96 - 112 mEq/L Final  . CO2 01/16/2015 25  19 - 32 mEq/L Final  . Glucose, Bld 01/16/2015 99  70 - 99 mg/dL Final  . BUN 01/16/2015 18  6 - 23 mg/dL Final  . Creat  01/16/2015 0.79  0.50 - 1.35 mg/dL Final  . Total Bilirubin 01/16/2015 0.6  0.2 - 1.2 mg/dL Final  . Alkaline Phosphatase 01/16/2015 49  39 - 117 U/L Final  . AST 01/16/2015 14  0 - 37 U/L Final  . ALT 01/16/2015 18  0 - 53 U/L Final  . Total Protein 01/16/2015 6.7  6.0 - 8.3 g/dL Final  . Albumin 01/16/2015 4.4  3.5 - 5.2 g/dL Final  . Calcium 01/16/2015 9.3  8.4 - 10.5 mg/dL  Final  . GFR, Est African American 01/16/2015 >89   Final  . GFR, Est Non African American 01/16/2015 >89   Final   Comment:   The estimated GFR is a calculation valid for adults (>=43 years old) that uses the CKD-EPI algorithm to adjust for age and sex. It is   not to be used for children, pregnant women, hospitalized patients,    patients on dialysis, or with rapidly changing kidney function. According to the NKDEP, eGFR >89 is normal, 60-89 shows mild impairment, 30-59 shows moderate impairment, 15-29 shows severe impairment and <15 is ESRD.     . TSH 01/16/2015 1.983  0.350 - 4.500 uIU/mL Final  . PSA 01/16/2015 0.93  <=4.00 ng/mL Final   Comment: Test Methodology: ECLIA PSA (Electrochemiluminescence Immunoassay)   For PSA values from 2.5-4.0, particularly in younger men <10 years old, the AUA and NCCN suggest testing for % Free PSA (3515) and evaluation of the rate of increase in PSA (PSA velocity).    Past Medical History  Diagnosis Date  . Hyperlipidemia   . Hypertension   . Diabetes mellitus without complication    No past surgical history on file. Current Outpatient Prescriptions on File Prior to Visit  Medication Sig Dispense Refill  . amLODipine (NORVASC) 10 MG tablet TAKE 1 TABLET BY MOUTH DAILY 90 tablet 3  . aspirin 81 MG tablet Take 81 mg by mouth daily.    . canagliflozin (INVOKANA) 300 MG TABS tablet Take 300 mg by mouth daily before breakfast. 30 tablet 0  . clotrimazole-betamethasone (LOTRISONE) cream Apply topically 2 (two) times daily. For 7 days 30 g 2  . fluticasone (FLONASE) 50  MCG/ACT nasal spray USE 2 SPRAYS IN EACH NOSTRIL EVERY DAY 1 g 3  . losartan-hydrochlorothiazide (HYZAAR) 100-25 MG per tablet TAKE 1 TABLET BY MOUTH DAILY. 30 tablet 5  . metFORMIN (GLUCOPHAGE) 1000 MG tablet TAKE 1 TABLET BY MOUTH TWICE A DAY 180 tablet 3  . Omega-3 Fatty Acids (FISH OIL) 1200 MG CAPS Take 3 capsules by mouth daily.    . rosuvastatin (CRESTOR) 40 MG tablet Take 0.5 tablets (20 mg total) by mouth daily. 90 tablet 3  . sitaGLIPtin (JANUVIA) 100 MG tablet TAKE 1 TABLET BY MOUTH ONCE A DAY 90 tablet 3   No current facility-administered medications on file prior to visit.   Allergies  Allergen Reactions  . Lipitor [Atorvastatin]     Myalgias  . Penicillins Rash   History   Social History  . Marital Status: Single    Spouse Name: N/A  . Number of Children: N/A  . Years of Education: N/A   Occupational History  . Not on file.   Social History Main Topics  . Smoking status: Never Smoker   . Smokeless tobacco: Former Systems developer    Types: Chew     Comment: quit 2002  . Alcohol Use: Yes     Comment: Occasional  . Drug Use: No  . Sexual Activity: Not on file   Other Topics Concern  . Not on file   Social History Narrative      Review of Systems  All other systems reviewed and are negative.      Objective:   Physical Exam  Constitutional: He appears well-developed and well-nourished.  Neck: Neck supple. No thyromegaly present.  Cardiovascular: Normal rate, regular rhythm, normal heart sounds and intact distal pulses.   No murmur heard. Pulmonary/Chest: Effort normal and breath sounds normal. No respiratory distress. He has no  wheezes. He has no rales.  Abdominal: Soft. Bowel sounds are normal. He exhibits no distension. There is no tenderness. There is no rebound and no guarding.  Musculoskeletal: He exhibits no edema.  Lymphadenopathy:    He has no cervical adenopathy.  Skin: No rash noted.  Vitals reviewed.         Assessment & Plan:  Benign  essential HTN  HLD (hyperlipidemia)  Diabetes mellitus type II, controlled  Patient's blood pressure is outstanding. His cholesterol is improved. His hemoglobin A1c is the best it has ever been in my office. I am very proud of the patient. He continues to lose weight. I will make no changes in his medication at this time and recheck the patient in 6 months.

## 2015-01-29 ENCOUNTER — Other Ambulatory Visit: Payer: Self-pay | Admitting: Family Medicine

## 2015-01-29 NOTE — Telephone Encounter (Signed)
Refill appropriate and filled per protocol. 

## 2015-02-17 ENCOUNTER — Other Ambulatory Visit: Payer: Self-pay | Admitting: Family Medicine

## 2015-02-17 NOTE — Telephone Encounter (Signed)
Medication refilled per protocol. 

## 2015-02-27 ENCOUNTER — Other Ambulatory Visit: Payer: Self-pay | Admitting: Family Medicine

## 2015-02-27 NOTE — Telephone Encounter (Signed)
Medication refill per protocol °

## 2015-03-11 ENCOUNTER — Encounter: Payer: Self-pay | Admitting: Family Medicine

## 2015-06-15 ENCOUNTER — Encounter: Payer: Self-pay | Admitting: Family Medicine

## 2015-06-15 ENCOUNTER — Ambulatory Visit (INDEPENDENT_AMBULATORY_CARE_PROVIDER_SITE_OTHER): Payer: Commercial Managed Care - HMO | Admitting: Family Medicine

## 2015-06-15 VITALS — BP 138/74 | HR 80 | Temp 97.9°F | Resp 18 | Ht 76.0 in | Wt 249.0 lb

## 2015-06-15 DIAGNOSIS — R1013 Epigastric pain: Secondary | ICD-10-CM | POA: Diagnosis not present

## 2015-06-15 DIAGNOSIS — E119 Type 2 diabetes mellitus without complications: Secondary | ICD-10-CM

## 2015-06-15 LAB — CBC WITH DIFFERENTIAL/PLATELET
BASOS ABS: 0 10*3/uL (ref 0.0–0.1)
Basophils Relative: 0 % (ref 0–1)
Eosinophils Absolute: 0.1 10*3/uL (ref 0.0–0.7)
Eosinophils Relative: 1 % (ref 0–5)
HCT: 46.3 % (ref 39.0–52.0)
Hemoglobin: 16.3 g/dL (ref 13.0–17.0)
Lymphocytes Relative: 25 % (ref 12–46)
Lymphs Abs: 1.5 10*3/uL (ref 0.7–4.0)
MCH: 30.2 pg (ref 26.0–34.0)
MCHC: 35.2 g/dL (ref 30.0–36.0)
MCV: 85.9 fL (ref 78.0–100.0)
MPV: 9.8 fL (ref 8.6–12.4)
Monocytes Absolute: 0.5 10*3/uL (ref 0.1–1.0)
Monocytes Relative: 9 % (ref 3–12)
Neutro Abs: 4 10*3/uL (ref 1.7–7.7)
Neutrophils Relative %: 65 % (ref 43–77)
Platelets: 180 10*3/uL (ref 150–400)
RBC: 5.39 MIL/uL (ref 4.22–5.81)
RDW: 13.2 % (ref 11.5–15.5)
WBC: 6.1 10*3/uL (ref 4.0–10.5)

## 2015-06-15 LAB — COMPLETE METABOLIC PANEL WITH GFR
ALT: 18 U/L (ref 9–46)
AST: 13 U/L (ref 10–35)
Albumin: 4.2 g/dL (ref 3.6–5.1)
Alkaline Phosphatase: 61 U/L (ref 40–115)
BUN: 14 mg/dL (ref 7–25)
CHLORIDE: 102 mmol/L (ref 98–110)
CO2: 26 mmol/L (ref 20–31)
Calcium: 9.8 mg/dL (ref 8.6–10.3)
Creat: 0.81 mg/dL (ref 0.70–1.33)
GFR, Est African American: >89 mL/min (ref >=60)
GFR, Est Non African American: >89 mL/min (ref >=60)
GLUCOSE: 164 mg/dL — AB (ref 70–99)
Potassium: 4 mmol/L (ref 3.5–5.3)
SODIUM: 140 mmol/L (ref 135–146)
Total Bilirubin: 0.6 mg/dL (ref 0.2–1.2)
Total Protein: 6.5 g/dL (ref 6.1–8.1)

## 2015-06-15 LAB — HEMOGLOBIN A1C
HEMOGLOBIN A1C: 6.3 % — AB (ref <5.7)
Mean Plasma Glucose: 134 mg/dL — ABNORMAL HIGH (ref <117)

## 2015-06-15 NOTE — Progress Notes (Signed)
Subjective:    Patient ID: Todd Ellis, male    DOB: [birth year], 59 y.o.   MRN: [id number]  HPI  patient reports that over the last 2 weeks he has had increasing dyspepsia. He reports abdominal bloating, belching, increased flatus. He also reports acid reflux with increasing congestion around his vocal cords and constantly during his throat. He reports dizziness upon standing and other signs of possible dehydration although he has not been eating as much recently. His most recent weights are listed below: Wt Readings from Last 3 Encounters:  06/15/15 249 lb (112.946 kg)  01/23/15 245 lb (111.131 kg)  05/05/14 252 lb (114.306 kg)    He denies any fevers or chils.   He denies any coughs, diarrhea, vomiting, rash, joint pains. Past Medical History  Diagnosis Date  . Hyperlipidemia   . Hypertension   . Diabetes mellitus without complication    No past surgical history on file. Current Outpatient Prescriptions on File Prior to Visit  Medication Sig Dispense Refill  . amLODipine (NORVASC) 10 MG tablet TAKE 1 TABLET BY MOUTH DAILY 90 tablet 3  . aspirin 81 MG tablet Take 81 mg by mouth daily.    . clotrimazole-betamethasone (LOTRISONE) cream Apply topically 2 (two) times daily. For 7 days 30 g 2  . CRESTOR 40 MG tablet TAKE 1/2 TABLET BY MOUTH DAILY 90 tablet 2  . fluticasone (FLONASE) 50 MCG/ACT nasal spray USE 2 SPRAYS IN EACH NOSTRIL EVERY DAY 48 g 3  . INVOKANA 300 MG TABS tablet TAKE 1 TABLET BY MOUTH DAILY BEFORE BREAKFAST. 30 tablet 5  . JANUVIA 100 MG tablet TAKE 1 TABLET BY MOUTH ONCE A DAY 90 tablet 3  . losartan-hydrochlorothiazide (HYZAAR) 100-25 MG per tablet TAKE 1 TABLET BY MOUTH DAILY. 30 tablet 5  . metFORMIN (GLUCOPHAGE) 1000 MG tablet TAKE 1 TABLET BY MOUTH TWICE A DAY 180 tablet 3  . Omega-3 Fatty Acids (FISH OIL) 1200 MG CAPS Take 3 capsules by mouth daily.     No current facility-administered medications on file prior to visit.   Allergies  Allergen  Reactions  . Lipitor [Atorvastatin]     Myalgias  . Penicillins Rash   Social History   Social History  . Marital Status: Single    Spouse Name: N/A  . Number of Children: N/A  . Years of Education: N/A   Occupational History  . Not on file.   Social History Main Topics  . Smoking status: Never Smoker   . Smokeless tobacco: Former Systems developer    Types: Chew     Comment: quit 2002  . Alcohol Use: Yes     Comment: Occasional  . Drug Use: No  . Sexual Activity: Not on file   Other Topics Concern  . Not on file   Social History Narrative      Review of Systems  All other systems reviewed and are negative.      Objective:   Physical Exam  Cardiovascular: Normal rate, regular rhythm and normal heart sounds.   No murmur heard. Pulmonary/Chest: Effort normal and breath sounds normal. No respiratory distress. He has no wheezes. He has no rales.  Abdominal: Soft. Bowel sounds are normal. He exhibits no distension. There is no tenderness. There is no rebound and no guarding.  Vitals reviewed.         Assessment & Plan:  Dyspepsia - Plan: H. pylori breath test  Diabetes mellitus type II, controlled - Plan: CBC with Differential/Platelet, COMPLETE METABOLIC  PANEL WITH GFR, Hemoglobin A1c   Patient has dyspepsa. I will like him to start dexilant  60 mg by mouth every morning. Recheck in one week. I temporarily want him to hold Imdur, and metformin. I want him to hold the metformin because this can cause stomach upset. I want him to hold the intercondylar cause this can cause orthostatic dizziness and dehydration. If labs show that the patient is not having hypoglycemia and that his diabetes is well controlled I will have him resume these medications. I will also check a breath test to check for H. Pylori.

## 2015-06-16 ENCOUNTER — Telehealth: Payer: Self-pay | Admitting: Family Medicine

## 2015-06-16 LAB — H. PYLORI BREATH TEST: H. PYLORI BREATH TEST: DETECTED — AB

## 2015-06-16 NOTE — Telephone Encounter (Signed)
Pt is calling for his lab results from his visit yesterday. You can reach him at [phone] or [phone]

## 2015-06-16 NOTE — Telephone Encounter (Signed)
LMTRC on home #

## 2015-06-17 MED ORDER — METRONIDAZOLE 500 MG PO TABS: 500.0000 mg | ORAL_TABLET | Freq: Two times a day (BID) | ORAL | Status: DC

## 2015-06-17 MED ORDER — CLARITHROMYCIN 500 MG PO TABS
500.0000 mg | ORAL_TABLET | Freq: Two times a day (BID) | ORAL | Status: DC
Start: 2015-06-17 — End: 2016-06-28

## 2015-06-17 MED ORDER — OMEPRAZOLE 20 MG PO CPDR: 20.0000 mg | DELAYED_RELEASE_CAPSULE | Freq: Two times a day (BID) | ORAL | Status: DC

## 2015-06-17 NOTE — Telephone Encounter (Signed)
Patient aware of results and meds sent to pharm

## 2015-07-13 ENCOUNTER — Other Ambulatory Visit: Payer: Self-pay | Admitting: Family Medicine

## 2015-07-13 NOTE — Telephone Encounter (Signed)
Refill appropriate and filled per protocol. 

## 2015-07-21 ENCOUNTER — Telehealth: Payer: Self-pay | Admitting: Family Medicine

## 2015-07-21 NOTE — Telephone Encounter (Signed)
Pt confused about his Omeprazole.  He says you told him when he was done with his post H-pylori treatment he did not need to take anymore.  07/13/15 pharmacy sent refill for Omeprazole and it was refilled for one year.  Wife picked up at pharmacy yesterday.  Pt calling now to verify whether he is to take this or not.  Pleased advise?

## 2015-07-21 NOTE — Telephone Encounter (Signed)
Pt advised provider recommendations

## 2015-07-21 NOTE — Telephone Encounter (Signed)
If his symptoms have stopped after prevpac, he does not need omeprazole.  If symptoms refturn, he needs to start back on omeprazole.

## 2015-07-31 ENCOUNTER — Other Ambulatory Visit: Payer: Self-pay | Admitting: Family Medicine

## 2015-07-31 NOTE — Telephone Encounter (Signed)
Medication refilled per protocol. 

## 2015-08-15 ENCOUNTER — Other Ambulatory Visit: Payer: Self-pay | Admitting: Family Medicine

## 2016-01-24 ENCOUNTER — Other Ambulatory Visit: Payer: Self-pay | Admitting: Family Medicine

## 2016-02-13 ENCOUNTER — Other Ambulatory Visit: Payer: Self-pay | Admitting: Family Medicine

## 2016-02-18 ENCOUNTER — Other Ambulatory Visit: Payer: Self-pay | Admitting: Family Medicine

## 2016-02-19 ENCOUNTER — Encounter: Payer: Self-pay | Admitting: *Deleted

## 2016-02-19 ENCOUNTER — Other Ambulatory Visit: Payer: Commercial Managed Care - HMO

## 2016-02-19 ENCOUNTER — Ambulatory Visit: Payer: Commercial Managed Care - HMO | Admitting: *Deleted

## 2016-02-19 VITALS — BP 138/78

## 2016-02-19 DIAGNOSIS — E119 Type 2 diabetes mellitus without complications: Secondary | ICD-10-CM

## 2016-02-19 DIAGNOSIS — I10 Essential (primary) hypertension: Secondary | ICD-10-CM

## 2016-02-19 DIAGNOSIS — E785 Hyperlipidemia, unspecified: Secondary | ICD-10-CM

## 2016-02-19 LAB — CBC WITH DIFFERENTIAL/PLATELET
BASOS ABS: 0 {cells}/uL (ref 0–200)
BASOS PCT: 0 %
EOS ABS: 55 {cells}/uL (ref 15–500)
Eosinophils Relative: 1 %
HEMATOCRIT: 47.1 % (ref 38.5–50.0)
Hemoglobin: 16 g/dL (ref 13.0–17.0)
LYMPHS PCT: 24 %
Lymphs Abs: 1320 cells/uL (ref 850–3900)
MCH: 30 pg (ref 27.0–33.0)
MCHC: 34 g/dL (ref 32.0–36.0)
MCV: 88.4 fL (ref 80.0–100.0)
MONO ABS: 495 {cells}/uL (ref 200–950)
MONOS PCT: 9 %
MPV: 10.4 fL (ref 7.5–12.5)
Neutro Abs: 3630 cells/uL (ref 1500–7800)
Neutrophils Relative %: 66 %
Platelets: 179 10*3/uL (ref 140–400)
RBC: 5.33 MIL/uL (ref 4.20–5.80)
RDW: 12.9 % (ref 11.0–15.0)
WBC: 5.5 10*3/uL (ref 3.8–10.8)

## 2016-02-19 LAB — COMPLETE METABOLIC PANEL WITH GFR
ALT: 25 U/L (ref 9–46)
AST: 17 U/L (ref 10–35)
Albumin: 4.5 g/dL (ref 3.6–5.1)
Alkaline Phosphatase: 46 U/L (ref 40–115)
BILIRUBIN TOTAL: 0.9 mg/dL (ref 0.2–1.2)
BUN: 13 mg/dL (ref 7–25)
CALCIUM: 9.6 mg/dL (ref 8.6–10.3)
CO2: 24 mmol/L (ref 20–31)
CREATININE: 0.78 mg/dL (ref 0.70–1.33)
Chloride: 102 mmol/L (ref 98–110)
Glucose, Bld: 113 mg/dL — ABNORMAL HIGH (ref 70–99)
Potassium: 4.4 mmol/L (ref 3.5–5.3)
Sodium: 139 mmol/L (ref 135–146)
TOTAL PROTEIN: 6.6 g/dL (ref 6.1–8.1)

## 2016-02-19 LAB — LIPID PANEL
CHOLESTEROL: 137 mg/dL (ref 125–200)
HDL: 38 mg/dL — ABNORMAL LOW (ref >=40)
LDL Cholesterol: 78 mg/dL (ref <130)
Total CHOL/HDL Ratio: 3.6 Ratio (ref <=5.0)
Triglycerides: 105 mg/dL (ref <150)
VLDL: 21 mg/dL (ref <30)

## 2016-02-19 NOTE — Progress Notes (Signed)
Patient ID: Todd Ellis, male   DOB: 02/18/1956, 60 y.o.   MRN: [id number]  Patient in office to have labs drawn and to monitor BP.   States that he has had no issues with BP at this time.   Noted to be 138/78. Advised BP is WNL.

## 2016-02-19 NOTE — Telephone Encounter (Signed)
Refill appropriate and filled per protocol. 

## 2016-02-20 LAB — HEMOGLOBIN A1C
HEMOGLOBIN A1C: 6.8 % — AB (ref <5.7)
MEAN PLASMA GLUCOSE: 148 mg/dL

## 2016-02-22 ENCOUNTER — Telehealth: Payer: Self-pay | Admitting: Family Medicine

## 2016-02-22 NOTE — Telephone Encounter (Signed)
Pt would like to know his lab results before he comes in for his OV w/ Dr. Dennard Schaumann tomorrow.  Please call [phone]

## 2016-02-22 NOTE — Telephone Encounter (Signed)
Patient called back wanting results of his labs he was hoping to have them before his appt. I looked at lab and told him the only documentation on the labs were stable OV and that Dr. Dennard Schaumann would discuss at [place].

## 2016-02-23 ENCOUNTER — Encounter: Payer: Self-pay | Admitting: Family Medicine

## 2016-02-23 ENCOUNTER — Ambulatory Visit (INDEPENDENT_AMBULATORY_CARE_PROVIDER_SITE_OTHER): Payer: Commercial Managed Care - HMO | Admitting: Family Medicine

## 2016-02-23 VITALS — BP 142/72 | HR 80 | Temp 97.6°F | Resp 16 | Ht 76.0 in | Wt 256.0 lb

## 2016-02-23 DIAGNOSIS — Z23 Encounter for immunization: Secondary | ICD-10-CM

## 2016-02-23 NOTE — Progress Notes (Signed)
Subjective:    Patient ID: Todd Ellis, male    DOB: May 21, 1956, 60 y.o.   MRN: [id number]  HPI Patient is a 60 year old white male who presents today to follow-up his diabetes, hypertension, and hyperlipidemia. Since I last saw the patient he has gained approximately 5 pounds. His weight is a little bit exaggerated today due to his work boots. He admits that he is not exercising as much as he was last year. Accordingly his blood pressure has gone up slightly to 588 systolic. His hemoglobin A1c has also risen from 6.3-6.8. His LDL cholesterol still excellent but his HDL cholesterol has fallen to 38 due to his lack of exercise. Otherwise he is doing well with no concerns Past Medical History  Diagnosis Date  . Hyperlipidemia   . Hypertension   . Diabetes mellitus without complication ([place])    No past surgical history on file. Current Outpatient Prescriptions on File Prior to Visit  Medication Sig Dispense Refill  . amLODipine (NORVASC) 10 MG tablet TAKE 1 TABLET BY MOUTH DAILY 90 tablet 0  . aspirin 81 MG tablet Take 81 mg by mouth daily.    . clarithromycin (BIAXIN) 500 MG tablet Take 1 tablet (500 mg total) by mouth 2 (two) times daily. 28 tablet 0  . clotrimazole-betamethasone (LOTRISONE) cream Apply topically 2 (two) times daily. For 7 days 30 g 2  . CRESTOR 40 MG tablet TAKE 1/2 TABLET BY MOUTH DAILY 90 tablet 2  . fluticasone (FLONASE) 50 MCG/ACT nasal spray SPRAY TWICE INTO EACH NOSTRIL EVERY DAY 48 g 3  . INVOKANA 300 MG TABS tablet TAKE 1 TABLET BY MOUTH DAILY BEFORE BREAKFAST. 30 tablet 5  . JANUVIA 100 MG tablet TAKE 1 TABLET BY MOUTH ONCE A DAY 90 tablet 0  . losartan-hydrochlorothiazide (HYZAAR) 100-25 MG tablet TAKE 1 TABLET EVERY DAY 90 tablet 0  . metFORMIN (GLUCOPHAGE) 1000 MG tablet TAKE 1 TABLET BY MOUTH TWICE A DAY 180 tablet 0  . metroNIDAZOLE (FLAGYL) 500 MG tablet Take 1 tablet (500 mg total) by mouth 2 (two) times daily. 28 tablet 0  . Omega-3 Fatty Acids  (FISH OIL) 1200 MG CAPS Take 3 capsules by mouth daily.    Marland Kitchen omeprazole (PRILOSEC) 20 MG capsule TAKE 1 CAPSULE TWICE A DAY BEFORE MEAL. 60 capsule 11   No current facility-administered medications on file prior to visit.   Allergies  Allergen Reactions  . Lipitor [Atorvastatin]     Myalgias  . Penicillins Rash   Social History   Social History  . Marital Status: Single    Spouse Name: N/A  . Number of Children: N/A  . Years of Education: N/A   Occupational History  . Not on file.   Social History Main Topics  . Smoking status: Never Smoker   . Smokeless tobacco: Former Systems developer    Types: Chew     Comment: quit 2002  . Alcohol Use: Yes     Comment: Occasional  . Drug Use: No  . Sexual Activity: Not on file   Other Topics Concern  . Not on file   Social History Narrative      Review of Systems  All other systems reviewed and are negative.      Objective:   Physical Exam  Constitutional: He appears well-developed and well-nourished. No distress.  HENT:  Head: Normocephalic and atraumatic.  Right Ear: External ear normal.  Left Ear: External ear normal.  Nose: Nose normal.  Mouth/Throat: Oropharynx is clear and  moist. No oropharyngeal exudate.  Neck: Neck supple. No JVD present. No thyromegaly present.  Cardiovascular: Normal rate, regular rhythm, normal heart sounds and intact distal pulses.  Exam reveals no friction rub.   No murmur heard. Pulmonary/Chest: Effort normal and breath sounds normal. No respiratory distress. He has no wheezes. He has no rales.  Abdominal: Soft. Bowel sounds are normal. He exhibits no distension. There is no tenderness. There is no rebound and no guarding.  Lymphadenopathy:    He has no cervical adenopathy.  Skin: He is not diaphoretic.  Vitals reviewed.         Assessment & Plan:  Need for Tdap vaccination - Plan: Tdap vaccine greater than or equal to 7yo IM  Diabetes mellitus type 2, essential hypertension,  hyperlipidemia. Most recent lab work as listed below: Lab on 02/19/2016  Component Date Value Ref Range Status  . WBC 02/19/2016 5.5  3.8 - 10.8 K/uL Final  . RBC 02/19/2016 5.33  4.20 - 5.80 MIL/uL Final  . Hemoglobin 02/19/2016 16.0  13.0 - 17.0 g/dL Final  . HCT 02/19/2016 47.1  38.5 - 50.0 % Final  . MCV 02/19/2016 88.4  80.0 - 100.0 fL Final  . MCH 02/19/2016 30.0  27.0 - 33.0 pg Final  . MCHC 02/19/2016 34.0  32.0 - 36.0 g/dL Final  . RDW 02/19/2016 12.9  11.0 - 15.0 % Final  . Platelets 02/19/2016 179  140 - 400 K/uL Final  . MPV 02/19/2016 10.4  7.5 - 12.5 fL Final  . Neutro Abs 02/19/2016 3630  1500 - 7800 cells/uL Final  . Lymphs Abs 02/19/2016 1320  850 - 3900 cells/uL Final  . Monocytes Absolute 02/19/2016 495  200 - 950 cells/uL Final  . Eosinophils Absolute 02/19/2016 55  15 - 500 cells/uL Final  . Basophils Absolute 02/19/2016 0  0 - 200 cells/uL Final  . Neutrophils Relative % 02/19/2016 66   Final  . Lymphocytes Relative 02/19/2016 24   Final  . Monocytes Relative 02/19/2016 9   Final  . Eosinophils Relative 02/19/2016 1   Final  . Basophils Relative 02/19/2016 0   Final  . Smear Review 02/19/2016 Criteria for review not met   Final   ** Please note change in unit of measure and reference range(s). **  . Sodium 02/19/2016 139  135 - 146 mmol/L Final  . Potassium 02/19/2016 4.4  3.5 - 5.3 mmol/L Final  . Chloride 02/19/2016 102  98 - 110 mmol/L Final  . CO2 02/19/2016 24  20 - 31 mmol/L Final  . Glucose, Bld 02/19/2016 113* 70 - 99 mg/dL Final  . BUN 02/19/2016 13  7 - 25 mg/dL Final  . Creat 02/19/2016 0.78  0.70 - 1.33 mg/dL Final  . Total Bilirubin 02/19/2016 0.9  0.2 - 1.2 mg/dL Final  . Alkaline Phosphatase 02/19/2016 46  40 - 115 U/L Final  . AST 02/19/2016 17  10 - 35 U/L Final  . ALT 02/19/2016 25  9 - 46 U/L Final  . Total Protein 02/19/2016 6.6  6.1 - 8.1 g/dL Final  . Albumin 02/19/2016 4.5  3.6 - 5.1 g/dL Final  . Calcium 02/19/2016 9.6  8.6 - 10.3  mg/dL Final  . GFR, Est African American 02/19/2016 >89  >=60 mL/min Final  . GFR, Est Non African American 02/19/2016 >89  >=60 mL/min Final   Comment:   The estimated GFR is a calculation valid for adults (>=20 years old) that uses the CKD-EPI algorithm to adjust  for age and sex. It is   not to be used for children, pregnant women, hospitalized patients,    patients on dialysis, or with rapidly changing kidney function. According to the NKDEP, eGFR >89 is normal, 60-89 shows mild impairment, 30-59 shows moderate impairment, 15-29 shows severe impairment and <15 is ESRD.     Marland Kitchen Hgb A1c MFr Bld 02/19/2016 6.8* <5.7 % Final   Comment:   For someone without known diabetes, a hemoglobin A1c value of 6.5% or greater indicates that they may have diabetes and this should be confirmed with a follow-up test.   For someone with known diabetes, a value <7% indicates that their diabetes is well controlled and a value greater than or equal to 7% indicates suboptimal control. A1c targets should be individualized based on duration of diabetes, age, comorbid conditions, and other considerations.   Currently, no consensus exists for use of hemoglobin A1c for diagnosis of diabetes for children.     . Mean Plasma Glucose 02/19/2016 148   Final  . Cholesterol 02/19/2016 137  125 - 200 mg/dL Final  . Triglycerides 02/19/2016 105  <150 mg/dL Final  . HDL 02/19/2016 38* >=40 mg/dL Final  . Total CHOL/HDL Ratio 02/19/2016 3.6  <=5.0 Ratio Final  . VLDL 02/19/2016 21  <30 mg/dL Final  . LDL Cholesterol 02/19/2016 78  <130 mg/dL Final   Comment:   Total Cholesterol/HDL Ratio:CHD Risk                        Coronary Heart Disease Risk Table                                        Men       Women          1/2 Average Risk              3.4        3.3              Average Risk              5.0        4.4           2X Average Risk              9.6        7.1           3X Average Risk             23.4        11.0 Use the calculated Patient Ratio above and the CHD Risk table  to determine the patient's CHD Risk.    Hemoglobin A1c is slightly elevated, HDL cholesterol slightly low, blood pressure slightly elevated. I believe all this is due to his lack of aerobic exercise and his recent weight gain. I've counseled the patient try to lose 5-10 pounds and to engage in 30 minutes of aerobic exercise for 5 days a week. I will recheck his lab work in 6 months.

## 2016-04-21 ENCOUNTER — Ambulatory Visit (INDEPENDENT_AMBULATORY_CARE_PROVIDER_SITE_OTHER): Payer: Commercial Managed Care - HMO | Admitting: Physician Assistant

## 2016-04-21 ENCOUNTER — Encounter: Payer: Self-pay | Admitting: Physician Assistant

## 2016-04-21 VITALS — BP 130/80 | Temp 97.5°F | Wt 256.0 lb

## 2016-04-21 DIAGNOSIS — H6121 Impacted cerumen, right ear: Secondary | ICD-10-CM | POA: Diagnosis not present

## 2016-04-21 NOTE — Progress Notes (Signed)
Patient ID: Todd Ellis MRN: [id number], DOB: 1955-10-12, 60 y.o. Date of Encounter: 04/21/2016, 4:18 PM    Chief Complaint:  Chief Complaint  Patient presents with  . Ear Pain     HPI: 60 y.o. year old white male says for about a week now his right ear has been  "aggravating ". Says that when he swallows, it pops. Says that he recently felt something in there and then a piece of wax came out. Still feeling irritated so wanted to come get it evaluated.  No other complaints or concerns.     Home Meds:   Outpatient Prescriptions Prior to Visit  Medication Sig Dispense Refill  . amLODipine (NORVASC) 10 MG tablet TAKE 1 TABLET BY MOUTH DAILY 90 tablet 0  . aspirin 81 MG tablet Take 81 mg by mouth daily.    . clarithromycin (BIAXIN) 500 MG tablet Take 1 tablet (500 mg total) by mouth 2 (two) times daily. 28 tablet 0  . clotrimazole-betamethasone (LOTRISONE) cream Apply topically 2 (two) times daily. For 7 days 30 g 2  . CRESTOR 40 MG tablet TAKE 1/2 TABLET BY MOUTH DAILY 90 tablet 2  . fluticasone (FLONASE) 50 MCG/ACT nasal spray SPRAY TWICE INTO EACH NOSTRIL EVERY DAY 48 g 3  . INVOKANA 300 MG TABS tablet TAKE 1 TABLET BY MOUTH DAILY BEFORE BREAKFAST. 30 tablet 5  . JANUVIA 100 MG tablet TAKE 1 TABLET BY MOUTH ONCE A DAY 90 tablet 0  . losartan-hydrochlorothiazide (HYZAAR) 100-25 MG tablet TAKE 1 TABLET EVERY DAY 90 tablet 0  . metFORMIN (GLUCOPHAGE) 1000 MG tablet TAKE 1 TABLET BY MOUTH TWICE A DAY 180 tablet 0  . metroNIDAZOLE (FLAGYL) 500 MG tablet Take 1 tablet (500 mg total) by mouth 2 (two) times daily. 28 tablet 0  . Omega-3 Fatty Acids (FISH OIL) 1200 MG CAPS Take 3 capsules by mouth daily.    Marland Kitchen omeprazole (PRILOSEC) 20 MG capsule TAKE 1 CAPSULE TWICE A DAY BEFORE MEAL. 60 capsule 11   No facility-administered medications prior to visit.    Allergies:  Allergies  Allergen Reactions  . Lipitor [Atorvastatin]     Myalgias  . Penicillins Rash      Review of  Systems: See HPI for pertinent ROS. All other ROS negative.    Physical Exam: Blood pressure 130/80, temperature 97.5 F (36.4 C), weight 256 lb (116.121 kg)., Body mass index is 31.17 kg/(m^2). General:  WNWD WM. Appears in no acute distress. HEENT: Normocephalic, atraumatic, eyes without discharge, sclera non-icteric, nares are without discharge. Right Ear Canal with hard ball of cerumen, deep within canal. Left ear canal patent, normal. Left TM normal.   Right Ear irrigated with saline/peroxide.  Re-examined after irrigation. Ear canal now patent with no residual cerumen. Small area of erythema on side of canal secondary to some irritation from the cerumen. Otherwise appears normal. Tympanic membrane appears normal. Neck: Supple. No thyromegaly. No lymphadenopathy. Lungs: Clear bilaterally to auscultation without wheezes, rales, or rhonchi. Breathing is unlabored. Heart: Regular rhythm. No murmurs, rubs, or gallops. Msk:  Strength and tone normal for age. Extremities/Skin: Warm and dry. Neuro: Alert and oriented X 3. Moves all extremities spontaneously. Gait is normal. CNII-XII grossly in tact. Psych:  Responds to questions appropriately with a normal affect.     ASSESSMENT AND PLAN:  60 y.o. year old male with   1. Cerumen impaction, right  Right Ear irrigated with saline/peroxide.  Re-examined after irrigation. Ear canal now patent with no  residual cerumen. Small area of erythema on side of canal secondary to some irritation from the cerumen. Otherwise appears normal. Tympanic membrane appears normal.  Discussed with patient that he may feel a little bit of irritation for a day or 2 secondary to this irrigation and secondary to the irritation in his ear.  In the future he can use over-the-counter drops to prevent re-occurrence.  Follow-up PRN   Signed, Olean Ree [place], [place], [facility] 04/21/2016 4:18 PM

## 2016-04-26 ENCOUNTER — Other Ambulatory Visit: Payer: Self-pay | Admitting: Family Medicine

## 2016-04-26 NOTE — Telephone Encounter (Signed)
Refill appropriate and filled per protocol. 

## 2016-04-27 ENCOUNTER — Other Ambulatory Visit: Payer: Self-pay | Admitting: Family Medicine

## 2016-04-27 NOTE — Telephone Encounter (Signed)
Refill appropriate and filled per protocol. 

## 2016-06-24 ENCOUNTER — Telehealth: Payer: Self-pay | Admitting: Family Medicine

## 2016-06-24 NOTE — Telephone Encounter (Signed)
Patient calling to speak to you regarding a sinus infection he has  [phone]

## 2016-06-24 NOTE — Telephone Encounter (Signed)
LMTRC

## 2016-06-27 NOTE — Telephone Encounter (Signed)
I made patient an appt for tomorrow with Dr. Buelah Manis. He would rather see Dr. Dennard Schaumann but we didn't have anything available.  CB# [phone]

## 2016-06-28 ENCOUNTER — Ambulatory Visit (INDEPENDENT_AMBULATORY_CARE_PROVIDER_SITE_OTHER): Payer: Commercial Managed Care - HMO | Admitting: Family Medicine

## 2016-06-28 ENCOUNTER — Ambulatory Visit: Payer: Commercial Managed Care - HMO | Admitting: Family Medicine

## 2016-06-28 ENCOUNTER — Encounter: Payer: Self-pay | Admitting: Family Medicine

## 2016-06-28 VITALS — BP 110/90 | HR 98 | Temp 97.9°F | Resp 20 | Ht 76.0 in | Wt 259.0 lb

## 2016-06-28 DIAGNOSIS — R5383 Other fatigue: Secondary | ICD-10-CM

## 2016-06-28 DIAGNOSIS — E119 Type 2 diabetes mellitus without complications: Secondary | ICD-10-CM | POA: Diagnosis not present

## 2016-06-28 DIAGNOSIS — R000 Tachycardia, unspecified: Secondary | ICD-10-CM

## 2016-06-28 LAB — VITAMIN B12: Vitamin B-12: 276 pg/mL (ref 200–1100)

## 2016-06-28 LAB — COMPLETE METABOLIC PANEL WITH GFR
ALT: 25 U/L (ref 9–46)
AST: 16 U/L (ref 10–35)
Albumin: 4.5 g/dL (ref 3.6–5.1)
Alkaline Phosphatase: 47 U/L (ref 40–115)
BUN: 14 mg/dL (ref 7–25)
CALCIUM: 9.8 mg/dL (ref 8.6–10.3)
CO2: 26 mmol/L (ref 20–31)
Chloride: 100 mmol/L (ref 98–110)
Creat: 0.99 mg/dL (ref 0.70–1.33)
GFR, Est Non African American: 83 mL/min (ref >=60)
Glucose, Bld: 156 mg/dL — ABNORMAL HIGH (ref 70–99)
POTASSIUM: 3.8 mmol/L (ref 3.5–5.3)
Sodium: 139 mmol/L (ref 135–146)
Total Bilirubin: 0.6 mg/dL (ref 0.2–1.2)
Total Protein: 6.6 g/dL (ref 6.1–8.1)

## 2016-06-28 LAB — CBC WITH DIFFERENTIAL/PLATELET
Basophils Absolute: 64 cells/uL (ref 0–200)
Basophils Relative: 1 %
Eosinophils Absolute: 64 cells/uL (ref 15–500)
Eosinophils Relative: 1 %
HEMATOCRIT: 47.9 % (ref 38.5–50.0)
Hemoglobin: 16.2 g/dL (ref 13.0–17.0)
LYMPHS PCT: 27 %
Lymphs Abs: 1728 cells/uL (ref 850–3900)
MCH: 29.8 pg (ref 27.0–33.0)
MCHC: 33.8 g/dL (ref 32.0–36.0)
MCV: 88.1 fL (ref 80.0–100.0)
MONO ABS: 576 {cells}/uL (ref 200–950)
MONOS PCT: 9 %
MPV: 10.9 fL (ref 7.5–12.5)
NEUTROS PCT: 62 %
Neutro Abs: 3968 cells/uL (ref 1500–7800)
PLATELETS: 193 10*3/uL (ref 140–400)
RBC: 5.44 MIL/uL (ref 4.20–5.80)
RDW: 13 % (ref 11.0–15.0)
WBC: 6.4 10*3/uL (ref 3.8–10.8)

## 2016-06-28 LAB — TSH: TSH: 1.54 m[IU]/L (ref 0.40–4.50)

## 2016-06-28 MED ORDER — METOPROLOL SUCCINATE ER 50 MG PO TB24: 50.0000 mg | ORAL_TABLET | Freq: Every day | ORAL | 3 refills | Status: DC

## 2016-06-28 NOTE — Progress Notes (Signed)
Subjective:    Patient ID: Todd Ellis, male    DOB: [birth year], 60 y.o.   MRN: [id number]  HPI Patient presents today with one month of fatigue. He states that whenever he gets out "doing something", he feels extremely tired like he needs to rest. He is concerned that his sugars are dropping. He states on other days he feels fine. He denies any shortness of breath or chest pain. Unfortunately he does not check his sugars when he feels poorly. He is also not tried to eat anything to see if that will make him feel better. He is not expressing any weight loss. He denies any fevers or chills. He denies any cough or shortness of breath. He denies any nausea vomiting or diarrhea. He denies any abdominal pain. However on my exam today, the patient is profoundly tachycardic with a heart rate of 126 bpm. Now whether this is anxiety or whether this is the root cause of his fatigue I am not sure. Past Medical History:  Diagnosis Date  . Diabetes mellitus without complication ([place])   . Hyperlipidemia   . Hypertension    No past surgical history on file. Current Outpatient Prescriptions on File Prior to Visit  Medication Sig Dispense Refill  . amLODipine (NORVASC) 10 MG tablet TAKE 1 TABLET BY MOUTH DAILY 90 tablet 0  . aspirin 81 MG tablet Take 81 mg by mouth daily.    . clotrimazole-betamethasone (LOTRISONE) cream Apply topically 2 (two) times daily. For 7 days 30 g 2  . fluticasone (FLONASE) 50 MCG/ACT nasal spray SPRAY TWICE INTO EACH NOSTRIL EVERY DAY 48 g 3  . INVOKANA 300 MG TABS tablet TAKE 1 TABLET BY MOUTH DAILY BEFORE BREAKFAST. 30 tablet 5  . JANUVIA 100 MG tablet TAKE 1 TABLET BY MOUTH ONCE A DAY 90 tablet 0  . losartan-hydrochlorothiazide (HYZAAR) 100-25 MG tablet TAKE 1 TABLET BY MOUTH DAILY 90 tablet 0  . metFORMIN (GLUCOPHAGE) 1000 MG tablet TAKE 1 TABLET BY MOUTH TWICE A DAY 180 tablet 0  . Omega-3 Fatty Acids (FISH OIL) 1200 MG CAPS Take 3 capsules by mouth daily.    Marland Kitchen  omeprazole (PRILOSEC) 20 MG capsule TAKE 1 CAPSULE TWICE A DAY BEFORE MEAL. 60 capsule 11  . rosuvastatin (CRESTOR) 40 MG tablet TAKE 1/2 TABLET BY MOUTH DAILY 90 tablet 1   No current facility-administered medications on file prior to visit.    Allergies  Allergen Reactions  . Lipitor [Atorvastatin]     Myalgias  . Penicillins Rash   Social History   Social History  . Marital status: Single    Spouse name: N/A  . Number of children: N/A  . Years of education: N/A   Occupational History  . Not on file.   Social History Main Topics  . Smoking status: Never Smoker  . Smokeless tobacco: Former Systems developer    Types: Chew     Comment: quit 2002  . Alcohol use Yes     Comment: Occasional  . Drug use: No  . Sexual activity: Not on file   Other Topics Concern  . Not on file   Social History Narrative  . No narrative on file      Review of Systems  All other systems reviewed and are negative.      Objective:   Physical Exam  Neck: Neck supple. No JVD present.  Cardiovascular: Regular rhythm and normal heart sounds.  Tachycardia present.   Pulmonary/Chest: Effort normal and breath sounds normal.  No respiratory distress. He has no wheezes. He has no rales.  Musculoskeletal: He exhibits no edema.  Vitals reviewed.  EKG shows sinus tachycardia. R to R intervals are consistent and regular. Though it is difficult to see any P waves given the tachycardia, I do not see any signs that this is atrial fibrillation with RVR. However there are new Q waves in 2, 3, aVF that were not present in 2014       Assessment & Plan:  Other fatigue - Plan: CBC with Differential/Platelet, COMPLETE METABOLIC PANEL WITH GFR, TSH, Testosterone, Vitamin B12  Tachycardia - Plan: EKG 12-Lead, EKG 12-Lead, metoprolol succinate (TOPROL-XL) 50 MG 24 hr tablet  Controlled type 2 diabetes mellitus without complication, without long-term current use of insulin ([place]) - Plan: Hemoglobin A1c  I will  schedule urgent follow-up with his cardiologist, Dr. Einar Gip.  Echocardiogram in 2014 showed ejection fraction of 55-60%. However his stress test at that time was equivocal per the patient report. I will start him on Toprol-XL 50 mg by mouth daily take the first dose immediately. Recheck urine in the morning and if he is still tachycardic, increase to 100 mg a day. Discontinue amlodipine to avoid hypotension. He is already on an aspirin as well as a statin. At the present time he denies any chest pain or shortness of breath. Should he develop any symptoms he is to go to the emergency room immediately

## 2016-06-29 ENCOUNTER — Ambulatory Visit: Payer: Commercial Managed Care - HMO | Admitting: Family Medicine

## 2016-06-29 LAB — HEMOGLOBIN A1C
HEMOGLOBIN A1C: 6.9 % — AB (ref <5.7)
MEAN PLASMA GLUCOSE: 151 mg/dL

## 2016-06-29 LAB — TESTOSTERONE: TESTOSTERONE: 431 ng/dL (ref 250–827)

## 2016-07-01 ENCOUNTER — Ambulatory Visit: Payer: Commercial Managed Care - HMO

## 2016-07-01 VITALS — HR 62

## 2016-07-01 DIAGNOSIS — I4729 Other ventricular tachycardia: Secondary | ICD-10-CM

## 2016-07-01 DIAGNOSIS — I472 Ventricular tachycardia, unspecified: Secondary | ICD-10-CM

## 2016-07-01 NOTE — Progress Notes (Signed)
Patient came in to have heart rate checked, states it had been up heart rate was 62. Pt stated he did not need to have blood pressure checked just needed heartrate

## 2016-07-04 ENCOUNTER — Encounter: Payer: Self-pay | Admitting: Family Medicine

## 2016-07-04 DIAGNOSIS — I4892 Unspecified atrial flutter: Secondary | ICD-10-CM | POA: Insufficient documentation

## 2016-07-05 ENCOUNTER — Encounter: Payer: Self-pay | Admitting: Family Medicine

## 2016-07-07 ENCOUNTER — Encounter: Payer: Self-pay | Admitting: Neurology

## 2016-07-07 ENCOUNTER — Ambulatory Visit (INDEPENDENT_AMBULATORY_CARE_PROVIDER_SITE_OTHER): Payer: Commercial Managed Care - HMO | Admitting: Neurology

## 2016-07-07 VITALS — BP 98/68 | HR 112 | Resp 20 | Ht 76.0 in | Wt 250.0 lb

## 2016-07-07 DIAGNOSIS — T733XXS Exhaustion due to excessive exertion, sequela: Secondary | ICD-10-CM

## 2016-07-07 DIAGNOSIS — T733XXA Exhaustion due to excessive exertion, initial encounter: Secondary | ICD-10-CM | POA: Insufficient documentation

## 2016-07-07 DIAGNOSIS — I4892 Unspecified atrial flutter: Secondary | ICD-10-CM

## 2016-07-07 DIAGNOSIS — I480 Paroxysmal atrial fibrillation: Secondary | ICD-10-CM

## 2016-07-07 DIAGNOSIS — R0683 Snoring: Secondary | ICD-10-CM

## 2016-07-07 NOTE — Progress Notes (Addendum)
SLEEP MEDICINE CLINIC   Provider:  Larey Seat, M D  Referring Provider: Susy Frizzle, MD Primary Care Physician:  Odette Fraction, MD  Chief Complaint  Patient presents with  . New Patient (Initial Visit)    dr Einar Gip referral, afib, needs sleep study    HPI:  Todd Ellis is a 60 y.o. male , seen here as a referral from [facility] and his NP.   Todd Ellis reports that he rarely gets 6 hours of sleep. It would be an exceptional good night to sleep that long. He is not quite sure when his sleep duration became more and more reduced, but it probably has been years. He has been feeling tired and fatigued, is less able to exercise or do the physical activities without fatigue for the past month. In early September while working on his lawn he had felt palpitations, shortness of breath and became lightheaded. He complained especially of shortness of breath when walking uphill. At rest he has not had shortness of breath no chest pains, chest tightness or pressure. Dr. Einar Gip also wrote that he had no near syncope or fainting, no ankle edema and no leg claudication. When he was seen at his primary care physician's office in late September he was found to have a rapid heartbeat and was diagnosed and followed up for atrial flutter.  I have seen his current medications he is also taking  invokana, metoprolol, Januvia, Losartan, hydrochlorothiazide, Crestor, metformin and Xeralto  He is chronically anticoagulated because of his atrial flutter / fibrillation.    Sleep habits are as follows: His usual bedtime is around 9 PM, and he falls asleep within a reasonable time but he cannot stay asleep. The earlier he goes to bed earlier he will wake up. He has developed a habit of having the TV running when he falls asleep, does not put on a timer. Otherwise his bedroom is cool but it may not be quiet and dark. He generally sleeps on his back, believe that he does not snore that much. Wife  confirmed this, he may snore louder if he had alcohol to drink at night. He will have one bathroom break at night, this does not wake him up he goes to the bathroom when already awake. He feels that he is refreshed and restored in the morning but would like to sleep a little longer. He denies any morning headaches, in the past he has sometimes feels palpitations and diaphoresis at night but not recently. He wakes up with a dry mouth.  Currently he usually wakes up around 3 AM and he rises anyway at about 4:30 to stop working. He is physically active as he walks on his job. He Armed forces logistics/support/administrative officer. He may take a nap in daytime, but rarely feels the urge.    Sleep medical history and family sleep history:  No family history of OSA, In his childhood had isolated events of  sleep walking. No history of neck surgery, ENT procedures.   Social history: Works for [employer]   Married, one son, working full time, caffeine :  2 coffees in am, no soda or iced tea, tobacco use: none, ETOH rarely.   Review of Systems: Out of a complete 14 system review, the patient complains of only the following symptoms, and all other reviewed systems are negative.  Epworth score 8 , Fatigue severity score 39  , depression score 2/15  Todd Ellis endorsed respiratory allergies, the feeling of decreased energy and daytime,  increased fatigue and insomnia. A history of diabetes, high cholesterol, atrial fibrillation-atrial flutter.   Social History   Social History  . Marital status: Married    Spouse name: N/A  . Number of children: N/A  . Years of education: N/A   Occupational History  . Not on file.   Social History Main Topics  . Smoking status: Never Smoker  . Smokeless tobacco: Former Systems developer    Types: Chew     Comment: quit 2002  . Alcohol use Yes     Comment: Occasional  . Drug use: No  . Sexual activity: Not on file   Other Topics Concern  . Not on file   Social History Narrative  . No  narrative on file    No family history on file.  Past Medical History:  Diagnosis Date  . Atrial flutter ([place])   . Diabetes mellitus without complication ([place])   . Hyperlipidemia   . Hypertension     No past surgical history on file.  Current Outpatient Prescriptions  Medication Sig Dispense Refill  . clotrimazole-betamethasone (LOTRISONE) cream Apply topically 2 (two) times daily. For 7 days 30 g 2  . digoxin (LANOXIN) 0.25 MG tablet     . fluticasone (FLONASE) 50 MCG/ACT nasal spray SPRAY TWICE INTO EACH NOSTRIL EVERY DAY 48 g 3  . INVOKANA 300 MG TABS tablet TAKE 1 TABLET BY MOUTH DAILY BEFORE BREAKFAST. 30 tablet 5  . JANUVIA 100 MG tablet TAKE 1 TABLET BY MOUTH ONCE A DAY 90 tablet 0  . metFORMIN (GLUCOPHAGE) 1000 MG tablet TAKE 1 TABLET BY MOUTH TWICE A DAY 180 tablet 0  . metoprolol succinate (TOPROL-XL) 50 MG 24 hr tablet Take 1 tablet (50 mg total) by mouth daily. Take with or immediately following a meal. 90 tablet 3  . Omega-3 Fatty Acids (FISH OIL) 1200 MG CAPS Take 3 capsules by mouth daily.    Marland Kitchen omeprazole (PRILOSEC) 20 MG capsule TAKE 1 CAPSULE TWICE A DAY BEFORE MEAL. 60 capsule 11  . rosuvastatin (CRESTOR) 40 MG tablet TAKE 1/2 TABLET BY MOUTH DAILY 90 tablet 1  . XARELTO 20 MG TABS tablet Take 20 mg by mouth daily.  2   No current facility-administered medications for this visit.     Allergies as of 07/07/2016 - Review Complete 07/07/2016  Allergen Reaction Noted  . Lipitor [atorvastatin]  03/14/2013  . Penicillins Rash 03/14/2013    Vitals: BP 98/68   Pulse (!) 112   Resp 20   Ht 6\' 4"  (1.93 m)   Wt 250 lb (113.4 kg)   BMI 30.43 kg/m  Last Weight:  Wt Readings from Last 1 Encounters:  07/07/16 250 lb (113.4 kg)   [id number] mass index is 30.43 kg/m.     Last Height:   Ht Readings from Last 1 Encounters:  07/07/16 6\' 4"  (1.93 m)    Physical exam:  General: The patient is awake, alert and appears not in acute distress. The patient is well  groomed. Head: Normocephalic, atraumatic. Neck is supple. Mallampati  4,  neck circumference: 18. Nasal airflow  patent,  Retrognathia is seen.  Biological teeth.  Cardiovascular:  Regular rate and rhythm , without  murmurs or carotid bruit, and without distended neck veins. Respiratory: Lungs are clear to auscultation. Skin:  Without evidence of edema, or rash Trunk: BMI is elevated . The patient's posture is erect  Neurologic exam : The patient is awake and alert, oriented to place and time.  Speech is  fluent,  without dysarthria, dysphonia or aphasia.  Mood and affect are concerned, worried.   Cranial nerves: Pupils are equal and briskly reactive to light. Extraocular movements  in vertical and horizontal planes intact and without nystagmus. Visual fields by finger perimetry are intact. Hearing to finger rub intact. Facial sensation intact to fine touch.Facial motor strength is symmetric and tongue and uvula move midline. Shoulder shrug was symmetrical.   Motor exam:  Normal tone, muscle bulk and symmetric strength in all extremities.  Sensory:  Fine touch, pinprick and vibration were tested in all extremities. Proprioception tested in the upper extremities was normal.  Coordination: Rapid alternating movements in the fingers/hands was normal. Finger-to-nose maneuver  normal without evidence of ataxia, dysmetria or tremor.  Gait and station: Patient walks without assistive device and is able unassisted to climb up to the exam table. Strength within normal limits.  Stance is stable and normal. Toe and hell stand were tested.Tandem gait is unfragmented. Turns with 3 Steps.  Deep tendon reflexes: in the  upper and lower extremities are symmetric and intact.   The patient was advised of the nature of the diagnosed sleep disorder , the treatment options and risks for general a health and wellness arising from not treating the condition.  I spent more than 40 minutes of face to face time  with the patient.  Greater than 50% of time was spent in counseling and coordination of care. We have discussed the diagnosis and differential and I answered the patient's questions.    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 He already cut out caffeine, yet he wakes up often before 6 hours of sleep have been achieved. He is not sure what wakes him. I will discuss with him to avoid the television in the bedroom, that may be a way to create white noise without any liked being admitted at the same time. He has been using a fan at night for many years. His fatigue and increased fatigue may be more related to his cardiac problems than to his reduced overall sleep time. I will be happy to evaluate him for sleep hypoxemia, apnea, atypical sleep arousals. An echocardiogram was done on 06/29/2016 was a poor window. Calculated ejection fraction was only 45% due to a flutter with a rapid ventricular response. He had multiple EKGs he reports cardiology notes state that there was no ST segment depression and no elevation. He was placed on new medications for rate control is peri-worried about his cardiac function and he states that also the atrial fibrillation may be treated he still has not recovered from fatigue and he is worried.   Assessment:  After physical and neurologic examination, review of laboratory studies,  Personal review of imaging studies, reports of other /same  Imaging studies ,  Results of polysomnography/ neurophysiology testing and pre-existing records as far as provided in visit., my assessment is   1) he has just recently been diagnosed with atrial fibrillation, a reduced ejection fraction and the fatigue he experiences is likely related. Understandably, he is worried about his heart function and what this means for his  health and future. My goal is to establish if the patient suffers from nocturnal hypoxemia and usually a home sleep test will be sufficient to this question. He is also much for likely to comply with a home  sleep test. I would like to add that Todd Ellis has a very low blood pressure of 98/68, and the fatigue may well be related to this factor as well. After his home sleep test we will meet and discuss the results and I will keep Brigid and Dr. Einar Gip  informed about the results, as soon as I have them available  Plan:  Treatment plan and additional workup :  HST    Asencion Partridge Meilyn Heindl MD  07/07/2016   CC: Susy Frizzle, [place] [address] [place], [place] [address]

## 2016-07-07 NOTE — Patient Instructions (Signed)
Fatigue  Fatigue is feeling tired all of the time, a lack of energy, or a lack of motivation. Occasional or mild fatigue is often a normal response to activity or life in general. However, long-lasting (chronic) or extreme fatigue may indicate an underlying medical condition.  HOME CARE INSTRUCTIONS   Watch your fatigue for any changes. The following actions may help to lessen any discomfort you are feeling:  · Talk to your health care provider about how much sleep you need each night. Try to get the required amount every night.  · Take medicines only as directed by your health care provider.  · Eat a healthy and nutritious diet. Ask your health care provider if you need help changing your diet.  · Drink enough fluid to keep your urine clear or pale yellow.  · Practice ways of relaxing, such as yoga, meditation, massage therapy, or acupuncture.  · Exercise regularly.    · Change situations that cause you stress. Try to keep your work and personal routine reasonable.  · Do not abuse illegal drugs.  · Limit alcohol intake to no more than 1 drink per day for nonpregnant women and 2 drinks per day for men. One drink equals 12 ounces of beer, 5 ounces of wine, or 1½ ounces of hard liquor.  · Take a multivitamin, if directed by your health care provider.  SEEK MEDICAL CARE IF:   · Your fatigue does not get better.  · You have a fever.    · You have unintentional weight loss or gain.  · You have headaches.    · You have difficulty:      Falling asleep.    Sleeping throughout the night.  · You feel angry, guilty, anxious, or sad.     · You are unable to have a bowel movement (constipation).    · You skin is dry.     · Your legs or another part of your body is swollen.    SEEK IMMEDIATE MEDICAL CARE IF:   · You feel confused.    · Your vision is blurry.  · You feel faint or pass out.    · You have a severe headache.    · You have severe abdominal, pelvic, or back pain.    · You have chest pain, shortness of breath, or an  irregular or fast heartbeat.    · You are unable to urinate or you urinate less than normal.    · You develop abnormal bleeding, such as bleeding from the rectum, vagina, nose, lungs, or nipples.  · You vomit blood.     · You have thoughts about harming yourself or committing suicide.    · You are worried that you might harm someone else.       This information is not intended to replace advice given to you by your health care provider. Make sure you discuss any questions you have with your health care provider.     Document Released: 07/17/2007 Document Revised: 10/10/2014 Document Reviewed: 01/21/2014  Elsevier Interactive Patient Education ©2016 [employer]

## 2016-07-21 ENCOUNTER — Encounter (INDEPENDENT_AMBULATORY_CARE_PROVIDER_SITE_OTHER): Payer: Commercial Managed Care - HMO | Admitting: Neurology

## 2016-07-21 DIAGNOSIS — G4710 Hypersomnia, unspecified: Secondary | ICD-10-CM | POA: Diagnosis not present

## 2016-07-21 DIAGNOSIS — R0683 Snoring: Secondary | ICD-10-CM

## 2016-07-21 DIAGNOSIS — I480 Paroxysmal atrial fibrillation: Secondary | ICD-10-CM

## 2016-07-21 DIAGNOSIS — T733XXS Exhaustion due to excessive exertion, sequela: Secondary | ICD-10-CM

## 2016-07-21 DIAGNOSIS — I4892 Unspecified atrial flutter: Secondary | ICD-10-CM

## 2016-07-24 NOTE — H&P (Signed)
OFFICE VISIT NOTES COPIED TO EPIC FOR DOCUMENTATION  . History of Present Illness Todd Ellis K. Vyas MD; 07/13/2016 10:28 AM) Patient words: Last O/V 07/07/2016; F/U for Aflutter.  The patient is a 60 year old male who presents for a Follow-up for Atrial flutter. Todd Ellis is 60 years old white male. He was diagnosed to have atrial flutter with a rapid ventricular response 2 weeks ago. Patient had symptoms of feeling tired and fatigued and occasional feeling of rapid heartbeat. After starting therapy for rate control, he has felt much better. He does not feel tired on moderate level of activities. He denies any palpitation, dizziness, near-syncope or syncope.  No complaints of chest pain, tightness or pressure. No history of swelling on the legs. No leg claudication. No history of GI bleed, hematuria or any other bleeding.  Patient has hypertension, diabetes mellitus type 2, and hyperlipidemia. He does not smoke. Patient has sedentary lifestyle. No history of thyroid problems. No history of TIA or CVA. No history of rheumatic fever.   Problem List/Past Medical Todd Ellis; 07/12/2016 1:11 PM) Benign essential HTN (I10)  Hyperlipidemia (E78.5)  Murmur (R01.1)  Obesity (BMI 30.0-34.9) (E66.9)  Diabetes Mellitus, Type II  Typical atrial flutter (I48.3) 06/28/2016 CHA2DS2-VASc score- 2 with yearly risk of stroke- 2.2% Echocardiogram 06/29/2016: Poor echo window. Wall motion abnormality has reduced sensitivity. Left ventricle cavity is normal in size. Mild concentric hypertrophy of the left ventricle. Mild decrease in LV systolic function with mild global hypokinesis. Unable to evaluate diastolic function due to A. flutter with rappid ventricular response. Calculated EF 45%. Left atrial cavity is moderately dilated at 4.5 cm. Right atrial cavity is moderately dilated. Mild tricuspid regurgitation. No evidence of pulmonary hypertension.  Allergies Todd Ellis; 07/12/2016  1:11 PM) Penicillins  Rash.  Family History Todd Ellis; 07/12/2016 1:11 PM) Mother  living; (no known heart conditions) Father  Deceased. at age 22 from an accident Sister 1  healthy  Social History Todd Ellis; 07/12/2016 1:11 PM) Current tobacco use  Never smoker. Alcohol Use  Occasional alcohol use. Marital status  Married. Number of Children  1.  Past Surgical History Todd Ellis; 07/12/2016 1:11 PM) None 07/30/2013  Medication History Todd Ellis; 07/12/2016 1:16 PM) Digoxin (250MCG Tablet, 1 (one) Tablet Tablet Oral as directed, Taken starting 06/30/2016) Active. (Take 1 tab every 4 hours x 3 doses, then once daily.) DilTIAZem CD (120MG  Capsule ER 24HR, 1 (one) Capsule Capsule Oral daily at night, Taken starting 06/30/2016) Active. Xarelto (20MG  Tablet, 1 (one) Tablet Tablet Tablet Oral daily, Taken starting 06/28/2016) Active. MetFORMIN HCl (1000MG  Tablet, 1 Oral two times daily) Active. Crestor (40MG  Tablet, 1/2 Oral daily) Active. Fish Oil (1000MG  Capsule, 3 Oral daily) Active. Vitamin C (1 Oral daily) Active. Cinnamon (500MG  Capsule, 2 Oral daily) Active. Januvia (100MG  Tablet, 1 Oral daily) Active. Fluticasone Propionate (Nasal) (50MCG/ACT Suspension, 2 sprays each nostril Nasal daily) Active. Metoprolol Succinate ER (50MG  Tablet ER 24HR, 1 Oral daily) Active. Invokana (300MG  Tablet, 1 Oral daily) Active. Medications Reconciled (verbal w/ pt)  Diagnostic Studies History Todd Ellis; 07/12/2016 1:11 PM) Echocardiogram  Echo- 08/15/13 1. Left ventricular cavity is normal in size. Mild concentric hypertrophy. Normal global wall motion. Normal systolic global function. Calculated EF 53%. Visual EF is 55-60%. Doppler evidence of Grade I (impaired) diastolic dysfunction. 2. Left atrial cavity is mildly dilated. 3. Mild aortic valve leaflet thickening. 4. Mitral valve structurally normal. Mitral valve inflow A > E ratio.  5. Tricuspid valve structurally normal. Mild tricuspid regurgitation.  Treadmill stress test  Stress test- 08/19/13 The patient exercised according to the Bruce protocol, Total time recorded 9 Min. 0 sec. achieving a max heart rate of 164 which was 100% of MPHR for age and 10.0 METS of work. Baseline NIBP was 124/78. Peak NIBP was 180/78 MaxSysp was: 184 MaxDiasp was: 78. The baseline ECG showed NSR,Minor nonsp. ST abnormal. During exercise there was No ST segment depressions in the Inf.lateral leads, No ST elevations in the leads Symptoms: SHOB, Tired.. Arrhythmia: Rare PVCs during exercise. . Indications: R/O Ischemia, HTN, abn. EKG. Conclusions: Nondiagnostic Test, Pt. had -0.5 mm horizontal ST. deprssion in inf.lat. leads in recovery.. * Signature *: Digitally signed by Despina Hick, MD, [facility], [place] [facility] 2010 removed several benign polyps Nuclear stress test 07/04/2016 1. Resting EKG demonstrates atrial flutter with variable controlled ventricular response, nonspecific ST-T wave abnormality. Stress EKG is nondiagnostic for ischemia as except pharmacologic stress test. 2. Myocardial perfusion imaging is normal. Overall left ventricular systolic function was normal without regional wall motion abnormalities. The LV was normal in size both in rest and stress images. The left ventricular ejection fraction was 54%.    Review of Systems Todd Ellis K. Vyas MD; 07/13/2016 10:27 AM)  Note: GENERAL- Does not feel tired, No fever, chills. No recent weight change. CARDIO VASCULAR- No chest pain, No shortness of breath, No orthopnea or PND. No palpitation, No dizziness, No fainting. Has hypertension & h/o high cholesterol. No swelling on legs. No claudication in legs, No cramps. No h/o DVT PULMONARY- No cough, phlegm, wheezing, not feeling congested in chest. GASTROINTESTINAL- No abdominal pain, nausea, vomiting or diarrhea. No dark tarry stools.Normal appetite. No heartburn. ENDOCRINE- No Thyroid  problem, No feeling of excessive heat or cold, No polydipsia or polyuria. Has Diabetes. NEUROLOGICAL- No focal motor or sensory symptoms, Good coordination. No seizures. MUSCULOSKELETAL- No generalized myalgias or muscle weakness. No joint swelling SKIN- No skin rash, No pruritus HEMATOLOGY- No anemia, petechiae, excessive bruising, epistaxis, GI bleed or any abnormal bleeding.   Vitals Todd Ellis K. Vyas MD; 07/12/2016 1:54 PM) 07/12/2016 1:11 PM Weight: 258.13 lb Height: 76in Body Surface Area: 2.47 m Body Mass Index: 31.42 kg/m  Pulse: 86 (Irregular)  P.OX: 95% (Room air) BP: 130/80 (Sitting, Left Arm, Standard)       Physical Exam Todd Ellis K. Vyas MD; 07/13/2016 10:26 AM) The physical exam findings are as follows: Note:GENERAL APPEARANCE- Alert, Oriented. Well built, Obese. NECK- No JVD. Carotid pulses are 2+, No bruits audible. No thyromegaly. No lymphadenopathy. HEART- Irregular rhythm Auscultation- Normal S1, S2. No gallops. Gr. 1/6 ESM is audible in aortic area, LPSA and apex. CHEST- Normal shape, Normal percussion. Auscultation- Normal breath sounds, No crepitations. No wheezing. ABDOMEN- Palpation- Soft, Nontender. No hepatosplenomegaly. No masses felt. Auscultation- No bruits audible. EXTREMITIES- No Clubbing or Cyanosis. No edema on legs or feet. PERIPHERAL PULSES- Both femoral pulses- 2+, No bruits audible. Both dorsalis pedis pulses- 2+, Both posterior tibial pulses- 2+    Assessment & Plan Todd Ellis K. Vyas MD; 07/13/2016 11:19 AM) Typical atrial flutter (I48.3) Story: CHA2DS2-VASc score- 2 with yearly risk of stroke- 2.2%  Echocardiogram 06/29/2016: Poor echo window. Wall motion abnormality has reduced sensitivity. Left ventricle cavity is normal in size. Mild concentric hypertrophy of the left ventricle. Mild decrease in LV systolic function with mild global hypokinesis. Unable to evaluate diastolic function due to A. flutter with rappid ventricular  response. Calculated EF 45%. Left atrial cavity is moderately dilated at 4.5 cm. Right atrial cavity is moderately  dilated. Mild tricuspid regurgitation. No evidence of pulmonary hypertension. Lexiscan sestamibi stress test 07/04/2016: 1. Resting EKG demonstrates atrial flutter with variable controlled ventricular response, nonspecific ST-T wave abnormality. Stress EKG is nondiagnostic for ischemia as except pharmacologic stress test. 2. Myocardial perfusion imaging is normal. Overall left ventricular systolic function was normal without regional wall motion abnormalities. The LV was normal in size both in rest and stress images. The left ventricular ejection fraction was 54%. Current Plans METABOLIC PANEL, BASIC (99991111) Complete electrocardiogram (93000) Benign essential HTN (I10) Hyperlipidemia (E78.5)  Note:EKG- Typical atrial flutter with rapid ventricular response, nonspecific T-wave abnormality.  Stress Myoview scans were negative for ischemia, it was explained to the patient. I have discussed my assessment with the patient. He remains in atrial flutter, ventricular response is moderate to somewhat rapid. He has improved symptomatically. Blood pressure is well controlled. Patient was advised to continue all the present medications. He is tolerating Xarelto well and does not have any bleeding complications. Patient has seen neurologist and has been scheduled for sleep study and nocturnal oximetry at home.  Options of cardioversion versus ablation were again discussed. Pros and cons were explained. After detailed discussion, patient opted to have cardioversion initially. If atrial flutter recurs, he will be referred to EP for ablation. The indications, procedure and possible complications of cardioversion were explained. He has been scheduled to have the procedure after 2 weeks by Dr. Einar Gip. He will be on anticoagulation therapy for about 4 weeks by then. Patient was advised to hold  digoxin and diltiazem from the day before the procedure. He had mild sinus bradycardia in the past and I'm concerned about possibility of excessive slow heart rates after cardioversion. He was also advised to hold diabetes medications on the morning of cardioversion.  I will see him in follow-up after cardioversion but patient was advised to call us if there are any problems in the interim.  CC: Dr. Jenna Luo.  Signed electronically by Despina Hick, MD (07/13/2016 12:57 PM)

## 2016-07-26 ENCOUNTER — Ambulatory Visit (HOSPITAL_COMMUNITY): Payer: 59 | Admitting: Anesthesiology

## 2016-07-26 ENCOUNTER — Ambulatory Visit (HOSPITAL_COMMUNITY)
Admission: RE | Admit: 2016-07-26 | Discharge: 2016-07-26 | Disposition: A | Discharge status: Home / Self Care | Payer: 59 | Source: Ambulatory Visit | Attending: Cardiology | Admitting: Cardiology

## 2016-07-26 ENCOUNTER — Encounter (HOSPITAL_COMMUNITY): Payer: Self-pay | Admitting: *Deleted

## 2016-07-26 ENCOUNTER — Encounter (HOSPITAL_COMMUNITY)
Admission: RE | Disposition: A | Discharge status: Home / Self Care | Payer: Self-pay | Source: Ambulatory Visit | Attending: Cardiology

## 2016-07-26 DIAGNOSIS — I4891 Unspecified atrial fibrillation: Secondary | ICD-10-CM | POA: Diagnosis not present

## 2016-07-26 DIAGNOSIS — E669 Obesity, unspecified: Secondary | ICD-10-CM | POA: Insufficient documentation

## 2016-07-26 DIAGNOSIS — Z7984 Long term (current) use of oral hypoglycemic drugs: Secondary | ICD-10-CM | POA: Insufficient documentation

## 2016-07-26 DIAGNOSIS — E119 Type 2 diabetes mellitus without complications: Secondary | ICD-10-CM | POA: Diagnosis not present

## 2016-07-26 DIAGNOSIS — I10 Essential (primary) hypertension: Secondary | ICD-10-CM | POA: Diagnosis not present

## 2016-07-26 DIAGNOSIS — E785 Hyperlipidemia, unspecified: Secondary | ICD-10-CM | POA: Insufficient documentation

## 2016-07-26 DIAGNOSIS — Z6831 Body mass index (BMI) 31.0-31.9, adult: Secondary | ICD-10-CM | POA: Insufficient documentation

## 2016-07-26 DIAGNOSIS — Z79899 Other long term (current) drug therapy: Secondary | ICD-10-CM | POA: Diagnosis not present

## 2016-07-26 DIAGNOSIS — I483 Typical atrial flutter: Secondary | ICD-10-CM | POA: Insufficient documentation

## 2016-07-26 HISTORY — PX: CARDIOVERSION: SHX1299

## 2016-07-26 LAB — GLUCOSE, CAPILLARY: Glucose-Capillary: 126 mg/dL — ABNORMAL HIGH (ref 65–99)

## 2016-07-26 SURGERY — CARDIOVERSION
Anesthesia: General

## 2016-07-26 MED ORDER — PROPOFOL 10 MG/ML IV BOLUS
INTRAVENOUS | Status: DC | PRN
  Administered 2016-07-26: 150 mg via INTRAVENOUS

## 2016-07-26 MED ORDER — FLECAINIDE ACETATE 50 MG PO TABS: 50.0000 mg | ORAL_TABLET | Freq: Two times a day (BID) | ORAL | 1 refills | Status: DC

## 2016-07-26 MED ORDER — LIDOCAINE 2% (20 MG/ML) 5 ML SYRINGE
INTRAMUSCULAR | Status: DC | PRN
  Administered 2016-07-26: 60 mg via INTRAVENOUS

## 2016-07-26 MED ORDER — SODIUM CHLORIDE 0.9 % IV SOLN
INTRAVENOUS | Status: DC
  Administered 2016-07-26 (×2): via INTRAVENOUS

## 2016-07-26 NOTE — Transfer of Care (Signed)
Immediate Anesthesia Transfer of Care Note  Patient: Todd Ellis  Procedure(s) Performed: Procedure(s): CARDIOVERSION (N/A)  Patient Location: PACU and Endoscopy Unit  Anesthesia Type:MAC  Level of Consciousness: awake, alert , oriented and patient cooperative  Airway & Oxygen Therapy: Patient Spontanous Breathing and Patient connected to nasal cannula oxygen  Post-op Assessment: Report given to RN and Post -op Vital signs reviewed and stable  Post vital signs: Reviewed and stable  Last Vitals:  Vitals:   07/26/16 1049  BP: (!) 155/118  Pulse: (!) 121  Resp: 16  Temp: 36.6 C    Last Pain:  Vitals:   07/26/16 1049  TempSrc: Oral         Complications: No apparent anesthesia complications

## 2016-07-26 NOTE — Anesthesia Postprocedure Evaluation (Signed)
Anesthesia Post Note  Patient: Todd Ellis  Procedure(s) Performed: Procedure(s) (LRB): CARDIOVERSION (N/A)  Patient location during evaluation: PACU Anesthesia Type: General Level of consciousness: awake and alert Pain management: pain level controlled Vital Signs Assessment: post-procedure vital signs reviewed and stable Respiratory status: spontaneous breathing, nonlabored ventilation and respiratory function stable Cardiovascular status: blood pressure returned to baseline and stable Postop Assessment: no signs of nausea or vomiting Anesthetic complications: no    Last Vitals:  Vitals:   07/26/16 1240 07/26/16 1247  BP: 140/85 110/72  Pulse: 77 88  Resp: 16 18  Temp:      Last Pain:  Vitals:   07/26/16 1225  TempSrc: Oral                 [name],W. EDMOND

## 2016-07-26 NOTE — Interval H&P Note (Signed)
History and Physical Interval Note:  07/26/2016 11:58 AM  Todd Ellis  has presented today for surgery, with the diagnosis of AFIB  The various methods of treatment have been discussed with the patient and family. After consideration of risks, benefits and other options for treatment, the patient has consented to  Procedure(s): CARDIOVERSION (N/A) as a surgical intervention .  The patient's history has been reviewed, patient examined, no change in status, stable for surgery.  I have reviewed the patient's chart and labs.  Questions were answered to the patient's satisfaction.     Adrian Prows

## 2016-07-26 NOTE — CV Procedure (Signed)
Direct current cardioversion:  Indication symptomatic A. Fibrillation.  Procedure: Using 120 mg of IV Propofol and 60 IV Lidocaine (for reducing venous pain) for achieving deep sedation, synchronized direct current cardioversion performed. Patient was delivered with 50x2 then 120 Joules of electricity X 1 with success to NSR. Patient tolerated the procedure well. No immediate complication noted.

## 2016-07-26 NOTE — Anesthesia Preprocedure Evaluation (Addendum)
Anesthesia Evaluation  Patient identified by MRN, date of birth, ID band Patient awake    Reviewed: Allergy & Precautions, H&P , NPO status , Patient's Chart, lab work & pertinent test results, reviewed documented beta blocker date and time   Airway Mallampati: III  TM Distance: >3 FB Neck ROM: Full    Dental no notable dental hx. (+) Teeth Intact, Dental Advisory Given   Pulmonary neg pulmonary ROS,    Pulmonary exam normal breath sounds clear to auscultation       Cardiovascular hypertension, Pt. on medications and Pt. on home beta blockers + dysrhythmias Atrial Fibrillation  Rhythm:Regular Rate:Tachycardia     Neuro/Psych negative neurological ROS  negative psych ROS   GI/Hepatic negative GI ROS, Neg liver ROS,   Endo/Other  diabetes, Type 2, Oral Hypoglycemic Agents  Renal/GU negative Renal ROS  negative genitourinary   Musculoskeletal   Abdominal   Peds  Hematology negative hematology ROS (+)   Anesthesia Other Findings   Reproductive/Obstetrics negative OB ROS                            Anesthesia Physical Anesthesia Plan  ASA: III  Anesthesia Plan: General   Post-op Pain Management:    Induction: Intravenous  Airway Management Planned: Mask  Additional Equipment:   Intra-op Plan:   Post-operative Plan:   Informed Consent: I have reviewed the patients History and Physical, chart, labs and discussed the procedure including the risks, benefits and alternatives for the proposed anesthesia with the patient or authorized representative who has indicated his/her understanding and acceptance.   Dental advisory given  Plan Discussed with: CRNA  Anesthesia Plan Comments:         Anesthesia Quick Evaluation

## 2016-07-26 NOTE — Discharge Instructions (Signed)
Electrical Cardioversion, Care After °Refer to this sheet in the next few weeks. These instructions provide you with information on caring for yourself after your procedure. Your health care provider may also give you more specific instructions. Your treatment has been planned according to current medical practices, but problems sometimes occur. Call your health care provider if you have any problems or questions after your procedure. °WHAT TO EXPECT AFTER THE PROCEDURE °After your procedure, it is typical to have the following sensations: °· Some redness on the skin where the shocks were delivered. If this is tender, a sunburn lotion or hydrocortisone cream may help. °· Possible return of an abnormal heart rhythm within hours or days after the procedure. °HOME CARE INSTRUCTIONS °· Take medicines only as directed by your health care provider. Be sure you understand how and when to take your medicine. °· Learn how to feel your pulse and check it often. °· Limit your activity for 48 hours after the procedure or as directed by your health care provider. °· Avoid or minimize caffeine and other stimulants as directed by your health care provider. °SEEK MEDICAL CARE IF: °· You feel like your heart is beating too fast or your pulse is not regular. °· You have any questions about your medicines. °· You have bleeding that will not stop. °SEEK IMMEDIATE MEDICAL CARE IF: °· You are dizzy or feel faint. °· It is hard to breathe or you feel short of breath. °· There is a change in discomfort in your chest. °· Your speech is slurred or you have trouble moving an arm or leg on one side of your body. °· You get a serious muscle cramp that does not go away. °· Your fingers or toes turn cold or blue. °  °This information is not intended to replace advice given to you by your health care provider. Make sure you discuss any questions you have with your health care provider. °  °Document Released: 07/10/2013 Document Revised: 10/10/2014  Document Reviewed: 07/10/2013 °Elsevier Interactive Patient Education ©2016 [employer] ° °

## 2016-07-26 NOTE — Anesthesia Procedure Notes (Signed)
Procedure Name: MAC Date/Time: 07/26/2016 12:02 PM Performed by: Clearnce Sorrel Pre-anesthesia Checklist: Patient identified, Emergency Drugs available, Suction available, Patient being monitored and Timeout performed Patient Re-evaluated:Patient Re-evaluated prior to inductionOxygen Delivery Method: Ambu bag Preoxygenation: Pre-oxygenation with 100% oxygen Intubation Type: IV induction Ventilation: Mask ventilation without difficulty Dental Injury: Teeth and Oropharynx as per pre-operative assessment

## 2016-07-27 ENCOUNTER — Other Ambulatory Visit: Payer: Self-pay | Admitting: Family Medicine

## 2016-07-27 ENCOUNTER — Encounter (HOSPITAL_COMMUNITY): Payer: Self-pay | Admitting: Cardiology

## 2016-07-27 ENCOUNTER — Telehealth: Payer: Self-pay

## 2016-07-27 DIAGNOSIS — Z9989 Dependence on other enabling machines and devices: Principal | ICD-10-CM

## 2016-07-27 DIAGNOSIS — G4733 Obstructive sleep apnea (adult) (pediatric): Secondary | ICD-10-CM

## 2016-07-27 NOTE — Telephone Encounter (Signed)
I called pt to discuss sleep study results. No answer, left a message asking him to call me back.    

## 2016-07-27 NOTE — Telephone Encounter (Signed)
I returned pt's call. No answer, left a message asking him to call me back.

## 2016-07-27 NOTE — Telephone Encounter (Signed)
Pt returned RN's call °

## 2016-07-27 NOTE — Telephone Encounter (Signed)
I spoke to pt. I advised him that his HST results revealed mild osa with an AHI of 1.2, tachy brady arrhythmia and hypoxemia. The oxygen saturation was low and prolonged. Dr. Brett Fairy recommends that have an auto-titration of CPAP 5-15 cm H2O. Pt is agreeable to starting a cpap. Pt says that he does have afib, which explains the variances in his HR. I advised him that the order for his CPAP will be sent to a DME and they will call him within a week to get his cpap set up. A follow up appt for insurance purposes was made for 10/13/16 at 2:30pm with Dr. Brett Fairy. Pt verbalized understanding of results. Pt had no questions at this time but was encouraged to call back if questions arise.  Order for cpap 5-15 cm H2O has not been placed. Will send to Dr. Brett Fairy for review.  Letter for cpap start mailed. Will refer to [facility] when order for CPAP has been placed.

## 2016-07-28 NOTE — Telephone Encounter (Signed)
Order for cpap sent to [facility].

## 2016-08-21 ENCOUNTER — Other Ambulatory Visit: Payer: Self-pay | Admitting: Family Medicine

## 2016-08-23 ENCOUNTER — Other Ambulatory Visit: Payer: Self-pay | Admitting: Family Medicine

## 2016-08-23 DIAGNOSIS — R000 Tachycardia, unspecified: Secondary | ICD-10-CM

## 2016-08-23 MED ORDER — CANAGLIFLOZIN 300 MG PO TABS: ORAL_TABLET | ORAL | 1 refills | Status: DC

## 2016-08-23 MED ORDER — OMEPRAZOLE 20 MG PO CPDR: DELAYED_RELEASE_CAPSULE | ORAL | 3 refills | Status: DC

## 2016-08-23 MED ORDER — SITAGLIPTIN PHOSPHATE 100 MG PO TABS: 100.0000 mg | ORAL_TABLET | Freq: Every day | ORAL | 1 refills | Status: DC

## 2016-08-23 MED ORDER — ROSUVASTATIN CALCIUM 40 MG PO TABS: 20.0000 mg | ORAL_TABLET | Freq: Every day | ORAL | 1 refills | Status: DC

## 2016-08-23 MED ORDER — METFORMIN HCL 1000 MG PO TABS: 1000.0000 mg | ORAL_TABLET | Freq: Two times a day (BID) | ORAL | 1 refills | Status: DC

## 2016-08-23 MED ORDER — LOSARTAN POTASSIUM-HCTZ 100-25 MG PO TABS: 1.0000 | ORAL_TABLET | Freq: Every day | ORAL | 3 refills | Status: DC

## 2016-08-23 MED ORDER — METOPROLOL SUCCINATE ER 50 MG PO TB24: 50.0000 mg | ORAL_TABLET | Freq: Every day | ORAL | 3 refills | Status: DC

## 2016-08-23 NOTE — Telephone Encounter (Signed)
Requesting 90 day supply on all medications.  All meds we rx snet to pharm for 90 day supply

## 2016-10-13 ENCOUNTER — Encounter: Payer: Self-pay | Admitting: Neurology

## 2016-10-13 ENCOUNTER — Ambulatory Visit (INDEPENDENT_AMBULATORY_CARE_PROVIDER_SITE_OTHER): Payer: Commercial Managed Care - HMO | Admitting: Neurology

## 2016-10-13 VITALS — BP 132/77 | HR 68 | Resp 20 | Ht 76.0 in | Wt 253.0 lb

## 2016-10-13 DIAGNOSIS — G4733 Obstructive sleep apnea (adult) (pediatric): Secondary | ICD-10-CM | POA: Insufficient documentation

## 2016-10-13 DIAGNOSIS — Z9989 Dependence on other enabling machines and devices: Secondary | ICD-10-CM | POA: Diagnosis not present

## 2016-10-13 NOTE — Progress Notes (Signed)
SLEEP MEDICINE CLINIC   Provider:  Larey Seat, M D  Referring Provider: Susy Frizzle, MD Primary Care Physician:  Odette Fraction, MD  Chief Complaint  Patient presents with  . Follow-up    cpap    HPI:  Todd Ellis is a 61 y.o. male , seen here as a referral from [facility] and his NP.   Todd Ellis reports that he rarely gets 6 hours of sleep. It would be an exceptional good night to sleep that long. He is not quite sure when his sleep duration became more and more reduced, but it probably has been years. He has been feeling tired and fatigued, is less able to exercise or do the physical activities without fatigue for the past month. In early September while working on his lawn he had felt palpitations, shortness of breath and became lightheaded. He complained especially of shortness of breath when walking uphill. At rest he has not had shortness of breath no chest pains, chest tightness or pressure. Dr. Einar Gip also wrote that he had no near syncope or fainting, no ankle edema and no leg claudication. When he was seen at his primary care physician's office in late September he was found to have a rapid heartbeat and was diagnosed and followed up for atrial flutter.  I have seen his current medications he is also taking  invokana, metoprolol, Januvia, Losartan, hydrochlorothiazide, Crestor, metformin and Xeralto  He is chronically anticoagulated because of his atrial flutter / fibrillation.    Sleep habits are as follows: His usual bedtime is around 9 PM, and he falls asleep within a reasonable time but he cannot stay asleep. The earlier he goes to bed earlier he will wake up. He has developed a habit of having the TV running when he falls asleep, does not put on a timer. Otherwise his bedroom is cool but it may not be quiet and dark. He generally sleeps on his back, believe that he does not snore that much. Wife confirmed this, he may snore louder if he had alcohol to  drink at night. He will have one bathroom break at night, this does not wake him up he goes to the bathroom when already awake. He feels that he is refreshed and restored in the morning but would like to sleep a little longer. He denies any morning headaches, in the past he has sometimes feels palpitations and diaphoresis at night but not recently. He wakes up with a dry mouth.  Currently he usually wakes up around 3 AM and he rises anyway at about 4:30 to stop working. He is physically active as he walks on his job. He Armed forces logistics/support/administrative officer. He may take a nap in daytime, but rarely feels the urge.   Sleep medical history and family sleep history:  No family history of OSA, In his childhood had isolated events of  sleep walking. No history of neck surgery, ENT procedures.   Social history: Works for [employer]  Married, one son, working full time, caffeine :  2 coffees in am, no soda or iced tea, tobacco use: none, ETOH rarely.    10-14-2015,  Todd Ellis underwent a home sleep test on 07/21/2016 which revealed mild apnea at 11.2 per hour of sleep but oxygen desaturations as frequently as 15 times per hour of sleep. He also had tachycardia and bradycardia arrhythmia noted his overall situation time at or under 88% was 89 minutes hypoxemia and this made the use of CPAP necessary. He  has been placed on an O2 sats it has reduced his apnea significantly but he does have issues to tolerate mask and machine. Currently he struggles with sinus and nasal congestion and rhinitis I would like to offer him a nasal spray to open nasal patency as well as an alternative interface. He advised me that he already has  nasal spray available. His compliance with 73% 4 days of use but only 53% for over 4 hours of use. Average user time is 4 hours and 6 minutes with an AutoSet between 5 and 15 cm of water, residual AHI is 5.0 most of the residual apneas are obstructive the 95th percentile pressure is 10.7 but would  consider this covered by the current pressure window. I would like for him to see my sleep lab manager right after this brief visit and we will try to fit him for a different mask that allows him to breathe through the mouth during times of nasal congestion. His Epworth sleepiness score was endorsed at 10 points. He is chronically anticoagulated due to atrial fib.   Review of Systems: Out of a complete 14 system review, the patient complains of only the following symptoms, and all other reviewed systems are negative.  Epworth score 8 , Fatigue severity score 39  , depression score 2/15  Todd Ellis endorsed respiratory allergies, the feeling of decreased energy and daytime, increased fatigue and insomnia. A history of diabetes, high cholesterol, atrial fibrillation-atrial flutter.   Social History   Social History  . Marital status: Married    Spouse name: N/A  . Number of children: N/A  . Years of education: N/A   Occupational History  . Not on file.   Social History Main Topics  . Smoking status: Never Smoker  . Smokeless tobacco: Former Systems developer    Types: Chew     Comment: quit 2002  . Alcohol use Yes     Comment: Occasional  . Drug use: No  . Sexual activity: Not on file   Other Topics Concern  . Not on file   Social History Narrative  . No narrative on file    No family history on file.  Past Medical History:  Diagnosis Date  . Atrial flutter ([place])   . Diabetes mellitus without complication ([place])   . Hyperlipidemia   . Hypertension     Past Surgical History:  Procedure Laterality Date  . CARDIOVERSION N/A 07/26/2016   Procedure: CARDIOVERSION;  Surgeon: Adrian Prows, MD;  Location: [facility];  Service: Cardiovascular;  Laterality: N/A;    Current Outpatient Prescriptions  Medication Sig Dispense Refill  . canagliflozin (INVOKANA) 300 MG TABS tablet TAKE 1 TABLET BY MOUTH DAILY BEFORE BREAKFAST. 90 tablet 1  . clotrimazole-betamethasone (LOTRISONE) cream  Apply topically 2 (two) times daily. For 7 days 30 g 2  . flecainide (TAMBOCOR) 50 MG tablet Take 1 tablet (50 mg total) by mouth 2 (two) times daily. 60 tablet 1  . fluticasone (FLONASE) 50 MCG/ACT nasal spray SPRAY TWICE INTO EACH NOSTRIL EVERY DAY 48 g 3  . losartan-hydrochlorothiazide (HYZAAR) 100-25 MG tablet Take 1 tablet by mouth daily. 90 tablet 3  . metFORMIN (GLUCOPHAGE) 1000 MG tablet Take 1 tablet (1,000 mg total) by mouth 2 (two) times daily. 180 tablet 1  . metoprolol succinate (TOPROL-XL) 50 MG 24 hr tablet Take 1 tablet (50 mg total) by mouth daily. Take with or immediately following a meal. 90 tablet 3  . Omega-3 Fatty Acids (FISH OIL) 1200 MG  CAPS Take 3 capsules by mouth daily.    Marland Kitchen omeprazole (PRILOSEC) 20 MG capsule TAKE 1 CAPSULE TWICE A DAY BEFORE MEAL. 180 capsule 3  . rosuvastatin (CRESTOR) 40 MG tablet Take 0.5 tablets (20 mg total) by mouth daily. 90 tablet 1  . sitaGLIPtin (JANUVIA) 100 MG tablet Take 1 tablet (100 mg total) by mouth daily. 90 tablet 1  . XARELTO 20 MG TABS tablet Take 20 mg by mouth daily.  2   No current facility-administered medications for this visit.     Allergies as of 10/13/2016 - Review Complete 10/13/2016  Allergen Reaction Noted  . Lipitor [atorvastatin]  03/14/2013  . Penicillins Rash 03/14/2013    Vitals: BP 132/77   Pulse 68   Resp 20   Ht 6\' 4"  (1.93 m)   Wt 253 lb (114.8 kg)   BMI 30.80 kg/m  Last Weight:  Wt Readings from Last 1 Encounters:  10/13/16 253 lb (114.8 kg)   [id number] mass index is 30.8 kg/m.     Last Height:   Ht Readings from Last 1 Encounters:  10/13/16 6\' 4"  (1.93 m)    Physical exam:  General: The patient is awake, alert and appears not in acute distress. The patient is well groomed. Head: Normocephalic, atraumatic. Neck is supple. Mallampati  4,  neck circumference: 18. Nasal airflow  patent,  Retrognathia is seen.  Biological teeth.  Cardiovascular:  Regular rate and rhythm , without  murmurs or  carotid bruit, and without distended neck veins. Respiratory: Lungs are clear to auscultation. Skin:  Without evidence of edema, or rash Trunk: BMI is elevated . The patient's posture is erect  Neurologic exam : The patient is awake and alert, oriented to place and time.  Speech is fluent,  without dysarthria, dysphonia or aphasia.  Mood and affect are concerned, worried.   Cranial nerves: Pupils are equal and briskly reactive to light. Extraocular movements  in vertical and horizontal planes intact and without nystagmus. Visual fields by finger perimetry are intact. Hearing to finger rub intact. Facial sensation intact to fine touch.Facial motor strength is symmetric and tongue and uvula move midline. Shoulder shrug was symmetrical.   Motor exam:  Normal tone, muscle bulk and symmetric strength in all extremities.  Sensory:  Fine touch, pinprick and vibration were tested in all extremities. Proprioception tested in the upper extremities was normal.  Coordination: Rapid alternating movements in the fingers/hands was normal. Finger-to-nose maneuver  normal without evidence of ataxia, dysmetria or tremor.  Gait and station: Patient walks without assistive device and is able unassisted to climb up to the exam table. Strength within normal limits.  Stance is stable and normal. Toe and hell stand were tested.Tandem gait is unfragmented. Turns with 3 Steps.  Deep tendon reflexes: in the  upper and lower extremities are symmetric and intact.   The patient was advised of the nature of the diagnosed sleep disorder , the treatment options and risks for general a health and wellness arising from not treating the condition.  I spent more than 40 minutes of face to face time with the patient.  Greater than 50% of time was spent in counseling and coordination of care. We have discussed the diagnosis and differential and I answered the patient's questions.    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 He  already cut out caffeine, yet he wakes up often before 6 hours of sleep have been achieved. He is not sure what wakes him. I will discuss with him to avoid the television in the bedroom, that may be a way to create white noise without any liked being admitted at the same time. He has been using a fan at night for many years. His fatigue and increased fatigue may be more related to his cardiac problems than to his reduced overall sleep time. I will be happy to evaluate him for sleep hypoxemia, apnea, atypical sleep arousals. An echocardiogram was done on 06/29/2016 was a poor window. Calculated ejection fraction was only 45% due to a flutter with a rapid ventricular response.He had multiple EKGs he reports cardiology notes state that there was no ST segment depression and no elevation. He was placed on new medications for rate control is peri-worried about his cardiac function and he states that also the atrial fibrillation may be treated he still has not recovered from fatigue and he is worried.   Assessment:  After physical and neurologic examination, review of laboratory studies,  Personal review of imaging studies, reports of other /same  Imaging studies ,  Results of polysomnography/ neurophysiology testing and pre-existing records as far as provided in visit., my assessment is   1) he has just recently been diagnosed with atrial fibrillation, a reduced ejection fraction and the fatigue he experiences is likely related. Understandably, he is worried about his heart function and what this means for his health and future. My goal is to establish treatment by CPAP for  OSA is mild but associated with tachy brady cardia, hypoxemia in an atrial fibrillation patient.  No stroke history.  Needs to use CPAP, I will try to find the  most comfortable interface for him and an alternative to nasal pillows.   Todd Partridge Jen Eppinger MD  10/13/2016   CC: Susy Frizzle, [address] [place], [place] [address]

## 2016-10-14 ENCOUNTER — Telehealth: Payer: Self-pay

## 2016-10-14 NOTE — Telephone Encounter (Signed)
Patient came to sleep lab for a mask fitting. He is currently on the dreamware and has trouble at night with leaking and sinus congestion. I fitted him with F&P Isom medium. He liked the feel of this mask. More secure. Had a 1 leak on cpap 5. He will try this and let me know. I showed him how to adjust his humidity on his machine to help with congestion.

## 2016-10-17 DIAGNOSIS — Z8679 Personal history of other diseases of the circulatory system: Secondary | ICD-10-CM | POA: Diagnosis not present

## 2016-10-17 DIAGNOSIS — I10 Essential (primary) hypertension: Secondary | ICD-10-CM | POA: Diagnosis not present

## 2016-10-17 DIAGNOSIS — E785 Hyperlipidemia, unspecified: Secondary | ICD-10-CM | POA: Diagnosis not present

## 2016-10-18 DIAGNOSIS — G4733 Obstructive sleep apnea (adult) (pediatric): Secondary | ICD-10-CM | POA: Diagnosis not present

## 2016-10-26 ENCOUNTER — Ambulatory Visit (INDEPENDENT_AMBULATORY_CARE_PROVIDER_SITE_OTHER): Payer: Commercial Managed Care - HMO | Admitting: Physician Assistant

## 2016-10-26 ENCOUNTER — Encounter: Payer: Self-pay | Admitting: Physician Assistant

## 2016-10-26 VITALS — BP 130/80 | HR 61 | Temp 98.2°F | Resp 18 | Wt 250.6 lb

## 2016-10-26 DIAGNOSIS — B9789 Other viral agents as the cause of diseases classified elsewhere: Secondary | ICD-10-CM | POA: Diagnosis not present

## 2016-10-26 DIAGNOSIS — R52 Pain, unspecified: Secondary | ICD-10-CM | POA: Diagnosis not present

## 2016-10-26 DIAGNOSIS — J988 Other specified respiratory disorders: Secondary | ICD-10-CM

## 2016-10-26 LAB — INFLUENZA A AND B AG, IMMUNOASSAY
INFLUENZA B ANTIGEN: NOT DETECTED
Influenza A Antigen: NOT DETECTED

## 2016-10-26 NOTE — Progress Notes (Signed)
Patient ID: Todd Ellis MRN: [id number], DOB: 03/31/1956, 61 y.o. Date of Encounter: 10/26/2016, 3:04 PM    Chief Complaint:  Chief Complaint  Patient presents with  . Chills    x3days  . sinus problems  . Generalized Body Aches     HPI: 61 y.o. year old male presents with above.   Has been having some congestion in his head and nose. Occasionally blows stuff out of his nose but not very frequent. Sometimes feels drainage down his throat. Has some cough but it seems to mostly be secondary to the drainage from his throat rather than deep in his chest. Wears a CPAP for sleep apnea and they recently gave him a new mask and have increase the humidity /moisture and he  "thinks that this is causing more problems than good." Thinks this is contributing to symptoms. Says that yesterday he was feeling better and it was warm weather yesterday so he got out some yesterday afternoon. No known fevers. No significant sore throat.     Home Meds:   Outpatient Medications Prior to Visit  Medication Sig Dispense Refill  . canagliflozin (INVOKANA) 300 MG TABS tablet TAKE 1 TABLET BY MOUTH DAILY BEFORE BREAKFAST. 90 tablet 1  . clotrimazole-betamethasone (LOTRISONE) cream Apply topically 2 (two) times daily. For 7 days 30 g 2  . flecainide (TAMBOCOR) 50 MG tablet Take 1 tablet (50 mg total) by mouth 2 (two) times daily. 60 tablet 1  . fluticasone (FLONASE) 50 MCG/ACT nasal spray SPRAY TWICE INTO EACH NOSTRIL EVERY DAY 48 g 3  . metFORMIN (GLUCOPHAGE) 1000 MG tablet Take 1 tablet (1,000 mg total) by mouth 2 (two) times daily. 180 tablet 1  . metoprolol succinate (TOPROL-XL) 50 MG 24 hr tablet Take 1 tablet (50 mg total) by mouth daily. Take with or immediately following a meal. 90 tablet 3  . Omega-3 Fatty Acids (FISH OIL) 1200 MG CAPS Take 3 capsules by mouth daily.    . rosuvastatin (CRESTOR) 40 MG tablet Take 0.5 tablets (20 mg total) by mouth daily. 90 tablet 1  . sitaGLIPtin (JANUVIA) 100  MG tablet Take 1 tablet (100 mg total) by mouth daily. 90 tablet 1  . XARELTO 20 MG TABS tablet Take 20 mg by mouth daily.  2  . losartan-hydrochlorothiazide (HYZAAR) 100-25 MG tablet Take 1 tablet by mouth daily. (Patient not taking: Reported on 10/26/2016) 90 tablet 3  . omeprazole (PRILOSEC) 20 MG capsule TAKE 1 CAPSULE TWICE A DAY BEFORE MEAL. (Patient not taking: Reported on 10/26/2016) 180 capsule 3   No facility-administered medications prior to visit.     Allergies:  Allergies  Allergen Reactions  . Lipitor [Atorvastatin]     Myalgias  . Penicillins Rash      Review of Systems: See HPI for pertinent ROS. All other ROS negative.    Physical Exam: Blood pressure 130/80, pulse 61, temperature 98.2 F (36.8 C), temperature source Oral, resp. rate 18, weight 250 lb 9.6 oz (113.7 kg), SpO2 98 %., Body mass index is 30.5 kg/m. General: WNWD WM.  Appears in no acute distress. HEENT: Normocephalic, atraumatic, eyes without discharge, sclera non-icteric, nares are without discharge. Bilateral auditory canals clear, TM's are without perforation, pearly grey and translucent with reflective cone of light bilaterally. Oral cavity moist, posterior pharynx without exudate, erythema, peritonsillar abscess. No tenderness with percussion to frontal or maxillary sinuses bilaterally. Neck: Supple. No thyromegaly. No lymphadenopathy. Lungs: Clear bilaterally to auscultation without wheezes, rales, or rhonchi. Breathing  is unlabored. Heart: Regular rhythm. No murmurs, rubs, or gallops. Msk:  Strength and tone normal for age. Extremities/Skin: Warm and dry.  Neuro: Alert and oriented X 3. Moves all extremities spontaneously. Gait is normal. CNII-XII grossly in tact. Psych:  Responds to questions appropriately with a normal affect.   Results for orders placed or performed in visit on 10/26/16  Influenza A and B Ag, Immunoassay  Result Value Ref Range   Source: NASAL    Influenza A Antigen Not  Detected Not Detected   Influenza B Antigen Not Detected Not Detected     ASSESSMENT AND PLAN:  61 y.o. year old male with  1. Viral respiratory infection Discussed with patient that influenza test is negative and clinically not highly suggestive for influenza. Consistent with viral infection. Discussed that this should run its course and resolve on its own. Can continue over-the-counter medications for symptom relief in the interim. If develops fever or if symptoms worsen significantly then follow-up. Otherwise give this 7-10 days and if not resolving at that point then follow-up. Note given to cover him being out of work Monday through Thursday. 2. Body aches - Influenza A and B Ag, Immunoassay   Signed, [address] [place], [place], [facility] 10/26/2016 3:04 PM

## 2016-11-18 DIAGNOSIS — G4733 Obstructive sleep apnea (adult) (pediatric): Secondary | ICD-10-CM | POA: Diagnosis not present

## 2016-12-08 ENCOUNTER — Ambulatory Visit: Payer: Commercial Managed Care - HMO | Admitting: Physician Assistant

## 2016-12-16 DIAGNOSIS — G4733 Obstructive sleep apnea (adult) (pediatric): Secondary | ICD-10-CM | POA: Diagnosis not present

## 2017-01-16 DIAGNOSIS — G4733 Obstructive sleep apnea (adult) (pediatric): Secondary | ICD-10-CM | POA: Diagnosis not present

## 2017-01-31 ENCOUNTER — Other Ambulatory Visit: Payer: Commercial Managed Care - HMO

## 2017-01-31 DIAGNOSIS — E119 Type 2 diabetes mellitus without complications: Secondary | ICD-10-CM

## 2017-01-31 DIAGNOSIS — I10 Essential (primary) hypertension: Secondary | ICD-10-CM

## 2017-01-31 DIAGNOSIS — E785 Hyperlipidemia, unspecified: Secondary | ICD-10-CM

## 2017-01-31 LAB — LIPID PANEL
CHOLESTEROL: 152 mg/dL (ref <200)
HDL: 40 mg/dL — AB (ref >40)
LDL CALC: 81 mg/dL (ref <100)
TRIGLYCERIDES: 153 mg/dL — AB (ref <150)
Total CHOL/HDL Ratio: 3.8 Ratio (ref <5.0)
VLDL: 31 mg/dL — ABNORMAL HIGH (ref <30)

## 2017-01-31 LAB — CBC WITH DIFFERENTIAL/PLATELET
BASOS PCT: 0 %
Basophils Absolute: 0 cells/uL (ref 0–200)
Eosinophils Absolute: 53 cells/uL (ref 15–500)
Eosinophils Relative: 1 %
HEMATOCRIT: 47.3 % (ref 38.5–50.0)
HEMOGLOBIN: 15.9 g/dL (ref 13.0–17.0)
LYMPHS ABS: 1325 {cells}/uL (ref 850–3900)
Lymphocytes Relative: 25 %
MCH: 30.5 pg (ref 27.0–33.0)
MCHC: 33.6 g/dL (ref 32.0–36.0)
MCV: 90.6 fL (ref 80.0–100.0)
MONO ABS: 477 {cells}/uL (ref 200–950)
MPV: 10.3 fL (ref 7.5–12.5)
Monocytes Relative: 9 %
Neutro Abs: 3445 cells/uL (ref 1500–7800)
Neutrophils Relative %: 65 %
Platelets: 182 10*3/uL (ref 140–400)
RBC: 5.22 MIL/uL (ref 4.20–5.80)
RDW: 13.2 % (ref 11.0–15.0)
WBC: 5.3 10*3/uL (ref 3.8–10.8)

## 2017-01-31 LAB — COMPLETE METABOLIC PANEL WITH GFR
ALBUMIN: 4 g/dL (ref 3.6–5.1)
ALK PHOS: 51 U/L (ref 40–115)
ALT: 20 U/L (ref 9–46)
AST: 15 U/L (ref 10–35)
BUN: 14 mg/dL (ref 7–25)
CALCIUM: 9.4 mg/dL (ref 8.6–10.3)
CHLORIDE: 104 mmol/L (ref 98–110)
CO2: 27 mmol/L (ref 20–31)
Creat: 0.86 mg/dL (ref 0.70–1.33)
Glucose, Bld: 124 mg/dL — ABNORMAL HIGH (ref 70–99)
POTASSIUM: 4.5 mmol/L (ref 3.5–5.3)
Sodium: 141 mmol/L (ref 135–146)
Total Bilirubin: 0.7 mg/dL (ref 0.2–1.2)
Total Protein: 6.4 g/dL (ref 6.1–8.1)

## 2017-02-01 LAB — HEMOGLOBIN A1C
HEMOGLOBIN A1C: 6.1 % — AB (ref <5.7)
MEAN PLASMA GLUCOSE: 128 mg/dL

## 2017-02-02 ENCOUNTER — Ambulatory Visit (INDEPENDENT_AMBULATORY_CARE_PROVIDER_SITE_OTHER): Payer: Commercial Managed Care - HMO | Admitting: Family Medicine

## 2017-02-02 ENCOUNTER — Encounter: Payer: Self-pay | Admitting: Family Medicine

## 2017-02-02 VITALS — BP 126/80 | HR 60 | Temp 98.0°F | Resp 18 | Ht 76.0 in | Wt 255.0 lb

## 2017-02-02 DIAGNOSIS — E7800 Pure hypercholesterolemia, unspecified: Secondary | ICD-10-CM

## 2017-02-02 DIAGNOSIS — I10 Essential (primary) hypertension: Secondary | ICD-10-CM | POA: Diagnosis not present

## 2017-02-02 DIAGNOSIS — E119 Type 2 diabetes mellitus without complications: Secondary | ICD-10-CM

## 2017-02-02 NOTE — Progress Notes (Signed)
Subjective:    Patient ID: Todd Ellis, male    DOB: [birth year], 61 y.o.   MRN: [id number]  HPI Patient presents today For follow-up of his diabetes mellitus, hypertension, and hyperlipidemia. He is quit drinking alcohol since his diagnosis of atrial flutter. He is now properly anticoagulated. He is on rhythm control with like a 9. He has discontinued alcohol. Today on examination he is in normal sinus rhythm. He denies any chest pain shortness of breath or dyspnea on exertion. His hemoglobin A1c is 6.1 indicating adequate control.  He denies any hypoglycemia. He denies any polyuria, polydipsia, or blurry vision. He is interested in discontinuing some of his diabetes medication. He is not exercising. He denies any myalgias or right upper quadrant pain. His most recent lab work as listed below: Appointment on 01/31/2017  Component Date Value Ref Range Status  . WBC 01/31/2017 5.3  3.8 - 10.8 K/uL Final  . RBC 01/31/2017 5.22  4.20 - 5.80 MIL/uL Final  . Hemoglobin 01/31/2017 15.9  13.0 - 17.0 g/dL Final  . HCT 01/31/2017 47.3  38.5 - 50.0 % Final  . MCV 01/31/2017 90.6  80.0 - 100.0 fL Final  . MCH 01/31/2017 30.5  27.0 - 33.0 pg Final  . MCHC 01/31/2017 33.6  32.0 - 36.0 g/dL Final  . RDW 01/31/2017 13.2  11.0 - 15.0 % Final  . Platelets 01/31/2017 182  140 - 400 K/uL Final  . MPV 01/31/2017 10.3  7.5 - 12.5 fL Final  . Neutro Abs 01/31/2017 3445  1,500 - 7,800 cells/uL Final  . Lymphs Abs 01/31/2017 1325  850 - 3,900 cells/uL Final  . Monocytes Absolute 01/31/2017 477  200 - 950 cells/uL Final  . Eosinophils Absolute 01/31/2017 53  15 - 500 cells/uL Final  . Basophils Absolute 01/31/2017 0  0 - 200 cells/uL Final  . Neutrophils Relative % 01/31/2017 65  % Final  . Lymphocytes Relative 01/31/2017 25  % Final  . Monocytes Relative 01/31/2017 9  % Final  . Eosinophils Relative 01/31/2017 1  % Final  . Basophils Relative 01/31/2017 0  % Final  . Smear Review 01/31/2017 Criteria for  review not met   Final  . Sodium 01/31/2017 141  135 - 146 mmol/L Final  . Potassium 01/31/2017 4.5  3.5 - 5.3 mmol/L Final  . Chloride 01/31/2017 104  98 - 110 mmol/L Final  . CO2 01/31/2017 27  20 - 31 mmol/L Final  . Glucose, Bld 01/31/2017 124* 70 - 99 mg/dL Final  . BUN 01/31/2017 14  7 - 25 mg/dL Final  . Creat 01/31/2017 0.86  0.70 - 1.33 mg/dL Final   Comment:   For patients > or = 60 years of age: The upper reference limit for Creatinine is approximately 13% higher for people identified as African-American.     . Total Bilirubin 01/31/2017 0.7  0.2 - 1.2 mg/dL Final  . Alkaline Phosphatase 01/31/2017 51  40 - 115 U/L Final  . AST 01/31/2017 15  10 - 35 U/L Final  . ALT 01/31/2017 20  9 - 46 U/L Final  . Total Protein 01/31/2017 6.4  6.1 - 8.1 g/dL Final  . Albumin 01/31/2017 4.0  3.6 - 5.1 g/dL Final  . Calcium 01/31/2017 9.4  8.6 - 10.3 mg/dL Final  . GFR, Est African American 01/31/2017 >89  >=60 mL/min Final  . GFR, Est Non African American 01/31/2017 >89  >=60 mL/min Final  . Hgb A1c MFr Bld 01/31/2017 6.1* <  5.7 % Final   Comment:   For someone without known diabetes, a hemoglobin A1c value between 5.7% and 6.4% is consistent with prediabetes and should be confirmed with a follow-up test.   For someone with known diabetes, a value <7% indicates that their diabetes is well controlled. A1c targets should be individualized based on duration of diabetes, age, co-morbid conditions and other considerations.   This assay result is consistent with an increased risk of diabetes.   Currently, no consensus exists regarding use of hemoglobin A1c for diagnosis of diabetes in children.     . Mean Plasma Glucose 01/31/2017 128  mg/dL Final  . Cholesterol 01/31/2017 152  <200 mg/dL Final  . Triglycerides 01/31/2017 153* <150 mg/dL Final  . HDL 01/31/2017 40* >40 mg/dL Final  . Total CHOL/HDL Ratio 01/31/2017 3.8  <5.0 Ratio Final  . VLDL 01/31/2017 31* <30 mg/dL Final  .  LDL Cholesterol 01/31/2017 81  <100 mg/dL Final    Past Medical History:  Diagnosis Date  . Atrial flutter ([place])   . Diabetes mellitus without complication ([place])   . Hyperlipidemia   . Hypertension    Past Surgical History:  Procedure Laterality Date  . CARDIOVERSION N/A 07/26/2016   Procedure: CARDIOVERSION;  Surgeon: Adrian Prows, MD;  Location[facility] ENDOSCOPY;  Service: Cardiovascular;  Laterality: N/A;   Current Outpatient Prescriptions on File Prior to Visit  Medication Sig Dispense Refill  . canagliflozin (INVOKANA) 300 MG TABS tablet TAKE 1 TABLET BY MOUTH DAILY BEFORE BREAKFAST. 90 tablet 1  . flecainide (TAMBOCOR) 50 MG tablet Take 1 tablet (50 mg total) by mouth 2 (two) times daily. 60 tablet 1  . fluticasone (FLONASE) 50 MCG/ACT nasal spray SPRAY TWICE INTO EACH NOSTRIL EVERY DAY 48 g 3  . metFORMIN (GLUCOPHAGE) 1000 MG tablet Take 1 tablet (1,000 mg total) by mouth 2 (two) times daily. 180 tablet 1  . metoprolol succinate (TOPROL-XL) 50 MG 24 hr tablet Take 1 tablet (50 mg total) by mouth daily. Take with or immediately following a meal. 90 tablet 3  . Omega-3 Fatty Acids (FISH OIL) 1200 MG CAPS Take 3 capsules by mouth daily.    . rosuvastatin (CRESTOR) 40 MG tablet Take 0.5 tablets (20 mg total) by mouth daily. 90 tablet 1  . losartan-hydrochlorothiazide (HYZAAR) 100-25 MG tablet Take 1 tablet by mouth daily. (Patient not taking: Reported on 02/02/2017) 90 tablet 3  . sitaGLIPtin (JANUVIA) 100 MG tablet Take 1 tablet (100 mg total) by mouth daily. (Patient not taking: Reported on 02/02/2017) 90 tablet 1  . XARELTO 20 MG TABS tablet Take 20 mg by mouth daily.  2   No current facility-administered medications on file prior to visit.    Allergies  Allergen Reactions  . Lipitor [Atorvastatin]     Myalgias  . Penicillins Rash   Social History   Social History  . Marital status: Married    Spouse name: N/A  . Number of children: N/A  . Years of education: N/A   Occupational  History  . Not on file.   Social History Main Topics  . Smoking status: Never Smoker  . Smokeless tobacco: Former Systems developer    Types: Chew     Comment: quit 2002  . Alcohol use Yes     Comment: Occasional  . Drug use: No  . Sexual activity: Not on file   Other Topics Concern  . Not on file   Social History Narrative  . No narrative on file  Review of Systems  All other systems reviewed and are negative.      Objective:   Physical Exam  Neck: Neck supple. No JVD present.  Cardiovascular: Normal rate, regular rhythm and normal heart sounds.   Pulmonary/Chest: Effort normal and breath sounds normal. No respiratory distress. He has no wheezes. He has no rales.  Abdominal: Soft. Bowel sounds are normal. He exhibits no distension and no mass. There is no tenderness. There is no rebound and no guarding.  Musculoskeletal: He exhibits no edema.  Vitals reviewed.       Assessment & Plan:  Controlled type 2 diabetes mellitus without complication, without long-term current use of insulin (HCC)  Benign essential HTN  Pure hypercholesterolemia  Blood pressure is at goal. I will make no changes in his blood pressure medication. Diabetes test is excellent. I explained to the patient if he starts exercising 30 minutes a day 5 days a week, we can discontinue his invokana which would certainly help to save him money. However he first must commit to exercising on a regular basis. His cholesterol is excellent and his LDL cholesterol is well below his goal of 70. Diabetic foot exam is up-to-date and is normal. Recommended annual diabetic eye exam.

## 2017-02-13 ENCOUNTER — Other Ambulatory Visit: Payer: Self-pay | Admitting: Family Medicine

## 2017-02-13 DIAGNOSIS — E785 Hyperlipidemia, unspecified: Secondary | ICD-10-CM | POA: Diagnosis not present

## 2017-02-13 DIAGNOSIS — Z8679 Personal history of other diseases of the circulatory system: Secondary | ICD-10-CM | POA: Diagnosis not present

## 2017-02-13 DIAGNOSIS — I10 Essential (primary) hypertension: Secondary | ICD-10-CM | POA: Diagnosis not present

## 2017-02-13 NOTE — Telephone Encounter (Signed)
Medication refilled per protocol. 

## 2017-02-15 DIAGNOSIS — G4733 Obstructive sleep apnea (adult) (pediatric): Secondary | ICD-10-CM | POA: Diagnosis not present

## 2017-03-14 ENCOUNTER — Other Ambulatory Visit: Payer: Self-pay | Admitting: Family Medicine

## 2017-03-18 DIAGNOSIS — G4733 Obstructive sleep apnea (adult) (pediatric): Secondary | ICD-10-CM | POA: Diagnosis not present

## 2017-04-05 ENCOUNTER — Other Ambulatory Visit: Payer: Self-pay | Admitting: Family Medicine

## 2017-04-11 ENCOUNTER — Ambulatory Visit (INDEPENDENT_AMBULATORY_CARE_PROVIDER_SITE_OTHER): Payer: Commercial Managed Care - HMO | Admitting: Family Medicine

## 2017-04-11 ENCOUNTER — Encounter: Payer: Self-pay | Admitting: Family Medicine

## 2017-04-11 VITALS — BP 140/92 | HR 68 | Temp 98.2°F | Resp 18 | Ht 76.0 in | Wt 258.0 lb

## 2017-04-11 DIAGNOSIS — R5383 Other fatigue: Secondary | ICD-10-CM | POA: Diagnosis not present

## 2017-04-11 DIAGNOSIS — M791 Myalgia, unspecified site: Secondary | ICD-10-CM

## 2017-04-11 LAB — CBC WITH DIFFERENTIAL/PLATELET
Basophils Absolute: 0 cells/uL (ref 0–200)
Basophils Relative: 0 %
EOS PCT: 1 %
Eosinophils Absolute: 55 cells/uL (ref 15–500)
HCT: 49.1 % (ref 38.5–50.0)
HEMOGLOBIN: 16.3 g/dL (ref 13.0–17.0)
LYMPHS PCT: 26 %
Lymphs Abs: 1430 cells/uL (ref 850–3900)
MCH: 29.6 pg (ref 27.0–33.0)
MCHC: 33.2 g/dL (ref 32.0–36.0)
MCV: 89.3 fL (ref 80.0–100.0)
MONO ABS: 550 {cells}/uL (ref 200–950)
MPV: 11 fL (ref 7.5–12.5)
Monocytes Relative: 10 %
NEUTROS PCT: 63 %
Neutro Abs: 3465 cells/uL (ref 1500–7800)
PLATELETS: 178 10*3/uL (ref 140–400)
RBC: 5.5 MIL/uL (ref 4.20–5.80)
RDW: 12.7 % (ref 11.0–15.0)
WBC: 5.5 10*3/uL (ref 3.8–10.8)

## 2017-04-11 NOTE — Progress Notes (Signed)
Subjective:    Patient ID: Todd Ellis, male    DOB: 28-Mar-1956, 61 y.o.   MRN: [id number]  Medication Refill   01/2017 Patient presents today For follow-up of his diabetes mellitus, hypertension, and hyperlipidemia. He is quit drinking alcohol since his diagnosis of atrial flutter. He is now properly anticoagulated. He is on rhythm control with like a 9. He has discontinued alcohol. Today on examination he is in normal sinus rhythm. He denies any chest pain shortness of breath or dyspnea on exertion. His hemoglobin A1c is 6.1 indicating adequate control.  He denies any hypoglycemia. He denies any polyuria, polydipsia, or blurry vision. He is interested in discontinuing some of his diabetes medication. He is not exercising. He denies any myalgias or right upper quadrant pain. His most recent lab work as listed below: No visits with results within 1 Month(s) from this visit.  Latest known visit with results is:  Appointment on 01/31/2017  Component Date Value Ref Range Status  . WBC 01/31/2017 5.3  3.8 - 10.8 K/uL Final  . RBC 01/31/2017 5.22  4.20 - 5.80 MIL/uL Final  . Hemoglobin 01/31/2017 15.9  13.0 - 17.0 g/dL Final  . HCT 01/31/2017 47.3  38.5 - 50.0 % Final  . MCV 01/31/2017 90.6  80.0 - 100.0 fL Final  . MCH 01/31/2017 30.5  27.0 - 33.0 pg Final  . MCHC 01/31/2017 33.6  32.0 - 36.0 g/dL Final  . RDW 01/31/2017 13.2  11.0 - 15.0 % Final  . Platelets 01/31/2017 182  140 - 400 K/uL Final  . MPV 01/31/2017 10.3  7.5 - 12.5 fL Final  . Neutro Abs 01/31/2017 3445  1,500 - 7,800 cells/uL Final  . Lymphs Abs 01/31/2017 1325  850 - 3,900 cells/uL Final  . Monocytes Absolute 01/31/2017 477  200 - 950 cells/uL Final  . Eosinophils Absolute 01/31/2017 53  15 - 500 cells/uL Final  . Basophils Absolute 01/31/2017 0  0 - 200 cells/uL Final  . Neutrophils Relative % 01/31/2017 65  % Final  . Lymphocytes Relative 01/31/2017 25  % Final  . Monocytes Relative 01/31/2017 9  % Final  .  Eosinophils Relative 01/31/2017 1  % Final  . Basophils Relative 01/31/2017 0  % Final  . Smear Review 01/31/2017 Criteria for review not met   Final  . Sodium 01/31/2017 141  135 - 146 mmol/L Final  . Potassium 01/31/2017 4.5  3.5 - 5.3 mmol/L Final  . Chloride 01/31/2017 104  98 - 110 mmol/L Final  . CO2 01/31/2017 27  20 - 31 mmol/L Final  . Glucose, Bld 01/31/2017 124* 70 - 99 mg/dL Final  . BUN 01/31/2017 14  7 - 25 mg/dL Final  . Creat 01/31/2017 0.86  0.70 - 1.33 mg/dL Final   Comment:   For patients > or = 60 years of age: The upper reference limit for Creatinine is approximately 13% higher for people identified as African-American.     . Total Bilirubin 01/31/2017 0.7  0.2 - 1.2 mg/dL Final  . Alkaline Phosphatase 01/31/2017 51  40 - 115 U/L Final  . AST 01/31/2017 15  10 - 35 U/L Final  . ALT 01/31/2017 20  9 - 46 U/L Final  . Total Protein 01/31/2017 6.4  6.1 - 8.1 g/dL Final  . Albumin 01/31/2017 4.0  3.6 - 5.1 g/dL Final  . Calcium 01/31/2017 9.4  8.6 - 10.3 mg/dL Final  . GFR, Est African American 01/31/2017 >89  >=60 mL/min  Final  . GFR, Est Non African American 01/31/2017 >89  >=60 mL/min Final  . Hgb A1c MFr Bld 01/31/2017 6.1* <5.7 % Final   Comment:   For someone without known diabetes, a hemoglobin A1c value between 5.7% and 6.4% is consistent with prediabetes and should be confirmed with a follow-up test.   For someone with known diabetes, a value <7% indicates that their diabetes is well controlled. A1c targets should be individualized based on duration of diabetes, age, co-morbid conditions and other considerations.   This assay result is consistent with an increased risk of diabetes.   Currently, no consensus exists regarding use of hemoglobin A1c for diagnosis of diabetes in children.     . Mean Plasma Glucose 01/31/2017 128  mg/dL Final  . Cholesterol 01/31/2017 152  <200 mg/dL Final  . Triglycerides 01/31/2017 153* <150 mg/dL Final  . HDL  01/31/2017 40* >40 mg/dL Final  . Total CHOL/HDL Ratio 01/31/2017 3.8  <5.0 Ratio Final  . VLDL 01/31/2017 31* <30 mg/dL Final  . LDL Cholesterol 01/31/2017 81  <100 mg/dL Final  At that time, my plan was: Blood pressure is at goal. I will make no changes in his blood pressure medication. Diabetes test is excellent. I explained to the patient if he starts exercising 30 minutes a day 5 days a week, we can discontinue his invokana which would certainly help to save him money. However he first must commit to exercising on a regular basis. His cholesterol is excellent and his LDL cholesterol is well below his goal of 70. Diabetic foot exam is up-to-date and is normal. Recommended annual diabetic eye exam.  04/11/17 Here today as a work in for fatigue and muscle pains.  He states that over the last several weeks, he has been experiencing muscle and joint pains primarily in his neck and in his shoulders and in his upper arms. There is no exacerbating or alleviating factors. He complains of some pain in his lower legs though not as severe as his neck and his shoulders and his biceps. It is occurring bilaterally. He denies any hand weakness. He is not dropping objects. He denies any numbness or tingling in the arms. He denies any fevers or chills. He denies any night sweats. There is no weight loss. There is no evidence of easy bruising. He has been bitten by several ticks the summer. He is taking a statin. There is been no weight loss, melena or hematochezia. His heart rate is well controlled in the 60s today. He denies any symptoms of bradycardia. He denies any symptoms of hypoglycemia. He also reports worsening fatigue. He has obstructive sleep apnea and is compliant with his CPAP.  Past Medical History:  Diagnosis Date  . Atrial flutter ([place])   . Diabetes mellitus without complication ([place])   . Hyperlipidemia   . Hypertension    Past Surgical History:  Procedure Laterality Date  . CARDIOVERSION N/A  07/26/2016   Procedure: CARDIOVERSION;  Surgeon: Adrian Prows, MD;  Location: [facility] ENDOSCOPY;  Service: Cardiovascular;  Laterality: N/A;   Current Outpatient Prescriptions on File Prior to Visit  Medication Sig Dispense Refill  . amLODipine (NORVASC) 10 MG tablet Take 10 mg by mouth daily.  6  . flecainide (TAMBOCOR) 50 MG tablet Take 1 tablet (50 mg total) by mouth 2 (two) times daily. 60 tablet 1  . INVOKANA 300 MG TABS tablet TAKE 1 TABLET BY MOUTH EVERY DAY BEFORE BREAKFAST 90 tablet 1  . JANUVIA 100 MG tablet  TAKE 1 TABLET BY MOUTH EVERY DAY 90 tablet 1  . lisinopril (PRINIVIL,ZESTRIL) 40 MG tablet Take 40 mg by mouth daily.  4  . metFORMIN (GLUCOPHAGE) 1000 MG tablet TAKE 1 TABLET BY MOUTH TWICE A DAY 180 tablet 1  . metoprolol succinate (TOPROL-XL) 50 MG 24 hr tablet Take 1 tablet (50 mg total) by mouth daily. Take with or immediately following a meal. 90 tablet 3  . Omega-3 Fatty Acids (FISH OIL) 1200 MG CAPS Take 3 capsules by mouth daily.    . rosuvastatin (CRESTOR) 40 MG tablet Take 0.5 tablets (20 mg total) by mouth daily. 90 tablet 1  . XARELTO 20 MG TABS tablet Take 20 mg by mouth daily.  2   No current facility-administered medications on file prior to visit.    Allergies  Allergen Reactions  . Lipitor [Atorvastatin]     Myalgias  . Penicillins Rash   Social History   Social History  . Marital status: Married    Spouse name: N/A  . Number of children: N/A  . Years of education: N/A   Occupational History  . Not on file.   Social History Main Topics  . Smoking status: Never Smoker  . Smokeless tobacco: Former Systems developer    Types: Chew     Comment: quit 2002  . Alcohol use Yes     Comment: Occasional  . Drug use: No  . Sexual activity: Not on file   Other Topics Concern  . Not on file   Social History Narrative  . No narrative on file      Review of Systems  All other systems reviewed and are negative.      Objective:   Physical Exam  Neck: Neck supple.  No JVD present.  Cardiovascular: Normal rate, regular rhythm and normal heart sounds.   Pulmonary/Chest: Effort normal and breath sounds normal. No respiratory distress. He has no wheezes. He has no rales.  Abdominal: Soft. Bowel sounds are normal. He exhibits no distension and no mass. There is no tenderness. There is no rebound and no guarding.  Musculoskeletal: He exhibits no edema.  Vitals reviewed.       Assessment & Plan:  Myalgia - Plan: CK, CBC with Differential/Platelet, COMPLETE METABOLIC PANEL WITH GFR, Sedimentation rate, B. burgdorfi antibodies by WB  Fatigue, unspecified type - Plan: Testosterone Total,Free,Bio, Males, TSH, Vitamin B12  His exam today is unremarkable. I suspect that his muscle aches could be secondary to statin medication. I've asked him to temporarily discontinue Crestor to see if the muscle aches will improve. Meanwhile I will check a sedimentation rate to evaluate for PMR or other autoimmune diseases, I will check Lyme titers to evaluate for Lyme disease, I'll check a CK for any sign of rhabdomyolysis from possible autoimmune disease,. I will check a CBC to evaluate for any bone marrow dyscrasias. I'll check a CMP to evaluate for any evidence of renal dysfunction or liver problems. Given his fatigue, I'll check testosterone levels, a TSH, and a vitamin B12 level. I will await the results of the lab work that the patient will go ahead and discontinue the statin medication to see if the muscle aches will improve. Of note, he also reports a vague nonspecific right-sided headache. He states that is mild. There are no vision changes associated with it. It is not particularly located over the temporal artery. Seems to be a tension headache.

## 2017-04-12 LAB — COMPLETE METABOLIC PANEL WITH GFR
ALT: 17 U/L (ref 9–46)
AST: 11 U/L (ref 10–35)
Albumin: 4.4 g/dL (ref 3.6–5.1)
Alkaline Phosphatase: 59 U/L (ref 40–115)
BUN: 14 mg/dL (ref 7–25)
CO2: 20 mmol/L (ref 20–31)
Calcium: 9.6 mg/dL (ref 8.6–10.3)
Chloride: 103 mmol/L (ref 98–110)
Creat: 0.89 mg/dL (ref 0.70–1.33)
GFR, Est African American: >89 mL/min (ref >=60)
Glucose, Bld: 148 mg/dL — ABNORMAL HIGH (ref 70–99)
POTASSIUM: 4.1 mmol/L (ref 3.5–5.3)
SODIUM: 138 mmol/L (ref 135–146)
TOTAL PROTEIN: 6.6 g/dL (ref 6.1–8.1)
Total Bilirubin: 0.6 mg/dL (ref 0.2–1.2)

## 2017-04-12 LAB — TESTOSTERONE TOTAL,FREE,BIO, MALES
ALBUMIN: 4.4 g/dL (ref 3.6–5.1)
SEX HORMONE BINDING: 45 nmol/L (ref 22–77)
TESTOSTERONE BIOAVAILABLE: 72.6 ng/dL — AB (ref 110.0–575.0)
Testosterone, Free: 36.1 pg/mL — ABNORMAL LOW (ref 46.0–224.0)
Testosterone: 361 ng/dL (ref 250–827)

## 2017-04-12 LAB — VITAMIN B12: VITAMIN B 12: 211 pg/mL (ref 200–1100)

## 2017-04-12 LAB — TSH: TSH: 2.05 mIU/L (ref 0.40–4.50)

## 2017-04-12 LAB — SEDIMENTATION RATE: Sed Rate: 1 mm/hr (ref 0–20)

## 2017-04-12 LAB — CK: Total CK: 39 U/L — ABNORMAL LOW (ref 44–196)

## 2017-04-17 DIAGNOSIS — G4733 Obstructive sleep apnea (adult) (pediatric): Secondary | ICD-10-CM | POA: Diagnosis not present

## 2017-04-18 ENCOUNTER — Encounter: Payer: Self-pay | Admitting: Family Medicine

## 2017-04-18 ENCOUNTER — Ambulatory Visit (INDEPENDENT_AMBULATORY_CARE_PROVIDER_SITE_OTHER): Payer: 59 | Admitting: Family Medicine

## 2017-04-18 VITALS — BP 128/80 | HR 76 | Temp 97.6°F | Resp 16 | Ht 76.0 in | Wt 259.0 lb

## 2017-04-18 DIAGNOSIS — E291 Testicular hypofunction: Secondary | ICD-10-CM

## 2017-04-18 DIAGNOSIS — R5383 Other fatigue: Secondary | ICD-10-CM | POA: Diagnosis not present

## 2017-04-18 DIAGNOSIS — M791 Myalgia, unspecified site: Secondary | ICD-10-CM

## 2017-04-18 LAB — LYME ABY, WSTRN BLT IGG & IGM W/BANDS
B burgdorferi IgG Abs (IB): NEGATIVE
B burgdorferi IgM Abs (IB): NEGATIVE
LYME DISEASE 18 KD IGG: NONREACTIVE
LYME DISEASE 23 KD IGG: NONREACTIVE
LYME DISEASE 39 KD IGM: NONREACTIVE
LYME DISEASE 41 KD IGM: NONREACTIVE
Lyme Disease 23 kD IgM: NONREACTIVE
Lyme Disease 28 kD IgG: NONREACTIVE
Lyme Disease 30 kD IgG: NONREACTIVE
Lyme Disease 39 kD IgG: NONREACTIVE
Lyme Disease 41 kD IgG: NONREACTIVE
Lyme Disease 45 kD IgG: NONREACTIVE
Lyme Disease 58 kD IgG: NONREACTIVE
Lyme Disease 66 kD IgG: NONREACTIVE
Lyme Disease 93 kD IgG: NONREACTIVE

## 2017-04-18 MED ORDER — TESTOSTERONE CYPIONATE 200 MG/ML IM SOLN: 200.0000 mg | INTRAMUSCULAR | 0 refills | Status: DC

## 2017-04-18 MED ORDER — "SYRINGE/NEEDLE (DISP) 23G X 1"" 3 ML MISC": 3 refills | Status: DC

## 2017-04-18 NOTE — Progress Notes (Signed)
Subjective:    Patient ID: Todd Ellis, male    DOB: April 14, 1956, 61 y.o.   MRN: [id number]  Medication Refill    Office Visit on 04/11/2017  Component Date Value Ref Range Status  . Total CK 04/11/2017 39* 44 - 196 U/L Final  . WBC 04/11/2017 5.5  3.8 - 10.8 K/uL Final  . RBC 04/11/2017 5.50  4.20 - 5.80 MIL/uL Final  . Hemoglobin 04/11/2017 16.3  13.0 - 17.0 g/dL Final  . HCT 04/11/2017 49.1  38.5 - 50.0 % Final  . MCV 04/11/2017 89.3  80.0 - 100.0 fL Final  . MCH 04/11/2017 29.6  27.0 - 33.0 pg Final  . MCHC 04/11/2017 33.2  32.0 - 36.0 g/dL Final  . RDW 04/11/2017 12.7  11.0 - 15.0 % Final  . Platelets 04/11/2017 178  140 - 400 K/uL Final  . MPV 04/11/2017 11.0  7.5 - 12.5 fL Final  . Neutro Abs 04/11/2017 3465  1,500 - 7,800 cells/uL Final  . Lymphs Abs 04/11/2017 1430  850 - 3,900 cells/uL Final  . Monocytes Absolute 04/11/2017 550  200 - 950 cells/uL Final  . Eosinophils Absolute 04/11/2017 55  15 - 500 cells/uL Final  . Basophils Absolute 04/11/2017 0  0 - 200 cells/uL Final  . Neutrophils Relative % 04/11/2017 63  % Final  . Lymphocytes Relative 04/11/2017 26  % Final  . Monocytes Relative 04/11/2017 10  % Final  . Eosinophils Relative 04/11/2017 1  % Final  . Basophils Relative 04/11/2017 0  % Final  . Smear Review 04/11/2017 Criteria for review not met   Final  . Sodium 04/11/2017 138  135 - 146 mmol/L Final  . Potassium 04/11/2017 4.1  3.5 - 5.3 mmol/L Final  . Chloride 04/11/2017 103  98 - 110 mmol/L Final  . CO2 04/11/2017 20  20 - 31 mmol/L Final  . Glucose, Bld 04/11/2017 148* 70 - 99 mg/dL Final  . BUN 04/11/2017 14  7 - 25 mg/dL Final  . Creat 04/11/2017 0.89  0.70 - 1.33 mg/dL Final   Comment:   For patients > or = 60 years of age: The upper reference limit for Creatinine is approximately 13% higher for people identified as African-American.     . Total Bilirubin 04/11/2017 0.6  0.2 - 1.2 mg/dL Final  . Alkaline Phosphatase 04/11/2017 59  40 -  115 U/L Final  . AST 04/11/2017 11  10 - 35 U/L Final  . ALT 04/11/2017 17  9 - 46 U/L Final  . Total Protein 04/11/2017 6.6  6.1 - 8.1 g/dL Final  . Albumin 04/11/2017 4.4  3.6 - 5.1 g/dL Final  . Calcium 04/11/2017 9.6  8.6 - 10.3 mg/dL Final  . GFR, Est African American 04/11/2017 >89  >=60 mL/min Final  . GFR, Est Non African American 04/11/2017 >89  >=60 mL/min Final  . Sed Rate 04/11/2017 1  0 - 20 mm/hr Final  . Testosterone 04/11/2017 361  250 - 827 ng/dL Final  . Albumin 04/11/2017 4.4  3.6 - 5.1 g/dL Final  . Sex Hormone Binding 04/11/2017 45  22 - 77 nmol/L Final  . Testosterone, Free 04/11/2017 36.1* 46.0 - 224.0 pg/mL Final  . Testosterone, Bioavailable 04/11/2017 72.6* 110.0 - 575.0 ng/dL Final  . TSH 04/11/2017 2.05  0.40 - 4.50 mIU/L Final  . Vitamin B-12 04/11/2017 211  200 - 1,100 pg/mL Final    04/11/17 Here today as a work in for fatigue and muscle  pains.  He states that over the last several weeks, he has been experiencing muscle and joint pains primarily in his neck and in his shoulders and in his upper arms. There is no exacerbating or alleviating factors. He complains of some pain in his lower legs though not as severe as his neck and his shoulders and his biceps. It is occurring bilaterally. He denies any hand weakness. He is not dropping objects. He denies any numbness or tingling in the arms. He denies any fevers or chills. He denies any night sweats. There is no weight loss. There is no evidence of easy bruising. He has been bitten by several ticks the summer. He is taking a statin. There is been no weight loss, melena or hematochezia. His heart rate is well controlled in the 60s today. He denies any symptoms of bradycardia. He denies any symptoms of hypoglycemia. He also reports worsening fatigue. He has obstructive sleep apnea and is compliant with his CPAP.  At that time, my plan was:  His exam today is unremarkable. I suspect that his muscle aches could be  secondary to statin medication. I've asked him to temporarily discontinue Crestor to see if the muscle aches will improve. Meanwhile I will check a sedimentation rate to evaluate for PMR or other autoimmune diseases, I will check Lyme titers to evaluate for Lyme disease, I'll check a CK for any sign of rhabdomyolysis from possible autoimmune disease,. I will check a CBC to evaluate for any bone marrow dyscrasias. I'll check a CMP to evaluate for any evidence of renal dysfunction or liver problems. Given his fatigue, I'll check testosterone levels, a TSH, and a vitamin B12 level. I will await the results of the lab work that the patient will go ahead and discontinue the statin medication to see if the muscle aches will improve. Of note, he also reports a vague nonspecific right-sided headache. He states that is mild. There are no vision changes associated with it. It is not particularly located over the temporal artery. Seems to be a tension headache.  04/18/17 Patient states that his muscle aches have improved after just one week off of his statin. They're gradually getting better. However he continues to endorse severe fatigue. His lab work as listed above. It was significant for low free testosterone as well as borderline low vitamin B12. He is here today discuss treatment options  Past Medical History:  Diagnosis Date  . Atrial flutter ([place])   . Diabetes mellitus without complication ([place])   . Hyperlipidemia   . Hypertension    Past Surgical History:  Procedure Laterality Date  . CARDIOVERSION N/A 07/26/2016   Procedure: CARDIOVERSION;  Surgeon: Adrian Prows, MD;  Location: [facility] ENDOSCOPY;  Service: Cardiovascular;  Laterality: N/A;   Current Outpatient Prescriptions on File Prior to Visit  Medication Sig Dispense Refill  . amLODipine (NORVASC) 10 MG tablet Take 10 mg by mouth daily.  6  . CINNAMON PO Take by mouth.    . flecainide (TAMBOCOR) 50 MG tablet Take 1 tablet (50 mg total) by mouth 2  (two) times daily. 60 tablet 1  . fluticasone (FLONASE) 50 MCG/ACT nasal spray Place 2 sprays into both nostrils daily.    . INVOKANA 300 MG TABS tablet TAKE 1 TABLET BY MOUTH EVERY DAY BEFORE BREAKFAST 90 tablet 1  . JANUVIA 100 MG tablet TAKE 1 TABLET BY MOUTH EVERY DAY 90 tablet 1  . lisinopril (PRINIVIL,ZESTRIL) 40 MG tablet Take 40 mg by mouth daily.  4  .  metFORMIN (GLUCOPHAGE) 1000 MG tablet TAKE 1 TABLET BY MOUTH TWICE A DAY 180 tablet 1  . metoprolol succinate (TOPROL-XL) 50 MG 24 hr tablet Take 1 tablet (50 mg total) by mouth daily. Take with or immediately following a meal. 90 tablet 3  . Omega-3 Fatty Acids (FISH OIL) 1200 MG CAPS Take 3 capsules by mouth daily.    . vitamin C (ASCORBIC ACID) 500 MG tablet Take 500 mg by mouth daily.    Alveda Reasons 20 MG TABS tablet Take 20 mg by mouth daily.  2  . rosuvastatin (CRESTOR) 40 MG tablet Take 0.5 tablets (20 mg total) by mouth daily. (Patient not taking: Reported on 04/18/2017) 90 tablet 1   No current facility-administered medications on file prior to visit.    Allergies  Allergen Reactions  . Lipitor [Atorvastatin]     Myalgias  . Penicillins Rash   Social History   Social History  . Marital status: Married    Spouse name: N/A  . Number of children: N/A  . Years of education: N/A   Occupational History  . Not on file.   Social History Main Topics  . Smoking status: Never Smoker  . Smokeless tobacco: Former Systems developer    Types: Chew     Comment: quit 2002  . Alcohol use Yes     Comment: Occasional  . Drug use: No  . Sexual activity: Not on file   Other Topics Concern  . Not on file   Social History Narrative  . No narrative on file      Review of Systems  All other systems reviewed and are negative.      Objective:   Physical Exam  Neck: Neck supple. No JVD present.  Cardiovascular: Normal rate, regular rhythm and normal heart sounds.   Pulmonary/Chest: Effort normal and breath sounds normal. No respiratory  distress. He has no wheezes. He has no rales.  Abdominal: Soft. Bowel sounds are normal. He exhibits no distension and no mass. There is no tenderness. There is no rebound and no guarding.  Musculoskeletal: He exhibits no edema.  Vitals reviewed.       Assessment & Plan:  Myalgia  Fatigue, unspecified type  Hypogonadism in male I have recommended that the patient begin 1000 g by mouth daily of vitamin B12 given the fact he has borderline low levels. We also had a long discussion about starting testosterone replacement. We discussed the risk and benefits including increased risk of cardiovascular disease, increased risk of prostate cancer, and polycythemia. After weighing the risk and the benefits, the patient would like to start testosterone cypionate 200 mg IM every 2 weeks and recheck lab work in 3 months. A prescription was given for the patient today in the office. He will return with the medication to be educated on the technique for injection and recheck lab work in 3 months. Also recommended that he stay off the statin for an additional week to see if the muscle aches will improve further

## 2017-04-28 ENCOUNTER — Ambulatory Visit (INDEPENDENT_AMBULATORY_CARE_PROVIDER_SITE_OTHER): Payer: 59

## 2017-04-28 DIAGNOSIS — E291 Testicular hypofunction: Secondary | ICD-10-CM

## 2017-04-28 MED ORDER — TESTOSTERONE CYPIONATE 200 MG/ML IM SOLN
200.0000 mg | Freq: Once | INTRAMUSCULAR | Status: AC
  Administered 2017-04-28: 200 mg via INTRAMUSCULAR

## 2017-04-28 NOTE — Progress Notes (Signed)
Patient was seen in office for a testosterone injection. The injection was given in the left ventrogluteal. Patient tolerated well.

## 2017-05-12 ENCOUNTER — Ambulatory Visit (INDEPENDENT_AMBULATORY_CARE_PROVIDER_SITE_OTHER): Payer: 59

## 2017-05-12 DIAGNOSIS — E291 Testicular hypofunction: Secondary | ICD-10-CM

## 2017-05-12 MED ORDER — TESTOSTERONE CYPIONATE 200 MG/ML IM SOLN
200.0000 mg | INTRAMUSCULAR | Status: DC
  Administered 2017-05-12 – 2017-07-20 (×4): 200 mg via INTRAMUSCULAR

## 2017-05-12 NOTE — Progress Notes (Signed)
Patient was seen in the office today for a testosterone injection which was given in right ventrogluteal. Patient tolerated well

## 2017-05-18 DIAGNOSIS — G4733 Obstructive sleep apnea (adult) (pediatric): Secondary | ICD-10-CM | POA: Diagnosis not present

## 2017-05-26 ENCOUNTER — Ambulatory Visit (INDEPENDENT_AMBULATORY_CARE_PROVIDER_SITE_OTHER): Payer: 59 | Admitting: Family Medicine

## 2017-05-26 DIAGNOSIS — E291 Testicular hypofunction: Secondary | ICD-10-CM

## 2017-06-08 DIAGNOSIS — I10 Essential (primary) hypertension: Secondary | ICD-10-CM | POA: Diagnosis not present

## 2017-06-08 DIAGNOSIS — R011 Cardiac murmur, unspecified: Secondary | ICD-10-CM | POA: Diagnosis not present

## 2017-06-08 DIAGNOSIS — Z8679 Personal history of other diseases of the circulatory system: Secondary | ICD-10-CM | POA: Diagnosis not present

## 2017-06-16 ENCOUNTER — Ambulatory Visit (INDEPENDENT_AMBULATORY_CARE_PROVIDER_SITE_OTHER): Payer: 59 | Admitting: Family Medicine

## 2017-06-16 DIAGNOSIS — E291 Testicular hypofunction: Secondary | ICD-10-CM

## 2017-06-18 DIAGNOSIS — G4733 Obstructive sleep apnea (adult) (pediatric): Secondary | ICD-10-CM | POA: Diagnosis not present

## 2017-06-20 DIAGNOSIS — I10 Essential (primary) hypertension: Secondary | ICD-10-CM | POA: Diagnosis not present

## 2017-06-20 DIAGNOSIS — Z8679 Personal history of other diseases of the circulatory system: Secondary | ICD-10-CM | POA: Diagnosis not present

## 2017-06-20 DIAGNOSIS — E785 Hyperlipidemia, unspecified: Secondary | ICD-10-CM | POA: Diagnosis not present

## 2017-07-20 ENCOUNTER — Ambulatory Visit (INDEPENDENT_AMBULATORY_CARE_PROVIDER_SITE_OTHER): Payer: 59 | Admitting: *Deleted

## 2017-07-20 DIAGNOSIS — Z23 Encounter for immunization: Secondary | ICD-10-CM | POA: Diagnosis not present

## 2017-07-20 DIAGNOSIS — E291 Testicular hypofunction: Secondary | ICD-10-CM | POA: Diagnosis not present

## 2017-08-18 ENCOUNTER — Other Ambulatory Visit: Payer: Self-pay | Admitting: Family Medicine

## 2017-09-07 DIAGNOSIS — H2513 Age-related nuclear cataract, bilateral: Secondary | ICD-10-CM | POA: Diagnosis not present

## 2017-09-07 DIAGNOSIS — E119 Type 2 diabetes mellitus without complications: Secondary | ICD-10-CM | POA: Diagnosis not present

## 2017-09-10 ENCOUNTER — Other Ambulatory Visit: Payer: Self-pay | Admitting: Family Medicine

## 2017-09-11 ENCOUNTER — Other Ambulatory Visit: Payer: Self-pay | Admitting: Family Medicine

## 2017-09-11 DIAGNOSIS — R000 Tachycardia, unspecified: Secondary | ICD-10-CM

## 2017-09-13 NOTE — Telephone Encounter (Signed)
Medication refill for one time only.  Patient needs to be seen.  Letter sent for patient to call and schedule 

## 2017-09-13 NOTE — Telephone Encounter (Signed)
Medication refill for one time only.  Patient needs to be seen.  Letter sent for patient to call and schedule.  Was to return on October for 3 mth follow up.

## 2017-10-14 ENCOUNTER — Other Ambulatory Visit: Payer: Self-pay | Admitting: Family Medicine

## 2017-11-13 ENCOUNTER — Other Ambulatory Visit: Payer: Self-pay | Admitting: Family Medicine

## 2017-11-19 ENCOUNTER — Other Ambulatory Visit: Payer: Self-pay | Admitting: Family Medicine

## 2017-11-24 ENCOUNTER — Other Ambulatory Visit: Payer: Self-pay | Admitting: Family Medicine

## 2017-11-24 DIAGNOSIS — R000 Tachycardia, unspecified: Secondary | ICD-10-CM

## 2017-12-23 ENCOUNTER — Other Ambulatory Visit: Payer: Self-pay | Admitting: Family Medicine

## 2018-01-01 ENCOUNTER — Other Ambulatory Visit: Payer: Self-pay | Admitting: Family Medicine

## 2018-01-01 DIAGNOSIS — E785 Hyperlipidemia, unspecified: Secondary | ICD-10-CM | POA: Diagnosis not present

## 2018-01-01 DIAGNOSIS — I10 Essential (primary) hypertension: Secondary | ICD-10-CM | POA: Diagnosis not present

## 2018-01-01 DIAGNOSIS — Z8679 Personal history of other diseases of the circulatory system: Secondary | ICD-10-CM | POA: Diagnosis not present

## 2018-01-01 DIAGNOSIS — I483 Typical atrial flutter: Secondary | ICD-10-CM | POA: Diagnosis not present

## 2018-01-15 DIAGNOSIS — I472 Ventricular tachycardia: Secondary | ICD-10-CM | POA: Diagnosis not present

## 2018-01-16 DIAGNOSIS — I472 Ventricular tachycardia: Secondary | ICD-10-CM | POA: Diagnosis not present

## 2018-01-16 DIAGNOSIS — Z8679 Personal history of other diseases of the circulatory system: Secondary | ICD-10-CM | POA: Diagnosis not present

## 2018-01-16 DIAGNOSIS — I10 Essential (primary) hypertension: Secondary | ICD-10-CM | POA: Diagnosis not present

## 2018-01-17 ENCOUNTER — Other Ambulatory Visit: Payer: Self-pay | Admitting: Family Medicine

## 2018-01-23 ENCOUNTER — Other Ambulatory Visit: Payer: Self-pay | Admitting: Family Medicine

## 2018-02-16 ENCOUNTER — Other Ambulatory Visit: Payer: Self-pay | Admitting: Family Medicine

## 2018-02-16 DIAGNOSIS — R000 Tachycardia, unspecified: Secondary | ICD-10-CM

## 2018-02-16 NOTE — Telephone Encounter (Signed)
Needs office visit and labs before further refills

## 2018-02-21 ENCOUNTER — Other Ambulatory Visit: Payer: Self-pay | Admitting: Family Medicine

## 2018-02-23 ENCOUNTER — Other Ambulatory Visit: Payer: Self-pay | Admitting: Family Medicine

## 2018-02-27 ENCOUNTER — Other Ambulatory Visit: Payer: Self-pay | Admitting: Family Medicine

## 2018-02-27 DIAGNOSIS — I10 Essential (primary) hypertension: Secondary | ICD-10-CM

## 2018-02-27 DIAGNOSIS — I480 Paroxysmal atrial fibrillation: Secondary | ICD-10-CM

## 2018-02-27 DIAGNOSIS — E119 Type 2 diabetes mellitus without complications: Secondary | ICD-10-CM

## 2018-02-27 DIAGNOSIS — I472 Ventricular tachycardia: Secondary | ICD-10-CM

## 2018-02-27 DIAGNOSIS — I4729 Other ventricular tachycardia: Secondary | ICD-10-CM

## 2018-02-27 DIAGNOSIS — E291 Testicular hypofunction: Secondary | ICD-10-CM

## 2018-02-27 DIAGNOSIS — E785 Hyperlipidemia, unspecified: Secondary | ICD-10-CM

## 2018-02-27 MED ORDER — SITAGLIPTIN PHOSPHATE 100 MG PO TABS: ORAL_TABLET | ORAL | 0 refills | Status: DC

## 2018-02-28 ENCOUNTER — Other Ambulatory Visit: Payer: 59

## 2018-02-28 DIAGNOSIS — E785 Hyperlipidemia, unspecified: Secondary | ICD-10-CM

## 2018-02-28 DIAGNOSIS — I4729 Other ventricular tachycardia: Secondary | ICD-10-CM

## 2018-02-28 DIAGNOSIS — E119 Type 2 diabetes mellitus without complications: Secondary | ICD-10-CM

## 2018-02-28 DIAGNOSIS — I480 Paroxysmal atrial fibrillation: Secondary | ICD-10-CM

## 2018-02-28 DIAGNOSIS — I10 Essential (primary) hypertension: Secondary | ICD-10-CM

## 2018-02-28 DIAGNOSIS — E291 Testicular hypofunction: Secondary | ICD-10-CM

## 2018-02-28 DIAGNOSIS — I472 Ventricular tachycardia, unspecified: Secondary | ICD-10-CM

## 2018-03-02 ENCOUNTER — Encounter: Payer: Self-pay | Admitting: Family Medicine

## 2018-03-02 ENCOUNTER — Ambulatory Visit: Payer: 59 | Admitting: Family Medicine

## 2018-03-02 VITALS — BP 132/80 | HR 66 | Temp 97.9°F | Resp 16 | Ht 76.0 in | Wt 255.0 lb

## 2018-03-02 DIAGNOSIS — E119 Type 2 diabetes mellitus without complications: Secondary | ICD-10-CM | POA: Diagnosis not present

## 2018-03-02 DIAGNOSIS — E291 Testicular hypofunction: Secondary | ICD-10-CM

## 2018-03-02 DIAGNOSIS — I10 Essential (primary) hypertension: Secondary | ICD-10-CM | POA: Diagnosis not present

## 2018-03-02 DIAGNOSIS — E7800 Pure hypercholesterolemia, unspecified: Secondary | ICD-10-CM

## 2018-03-02 DIAGNOSIS — Z23 Encounter for immunization: Secondary | ICD-10-CM | POA: Diagnosis not present

## 2018-03-02 NOTE — Progress Notes (Signed)
Subjective:    Patient ID: Todd Ellis, male    DOB: 1956/03/19, 62 y.o.   MRN: [id number]  Medication Refill   Patient has not been seen in almost a year.  He is here today at my request for follow-up.  When I last saw him, we had discontinued his statin due to myalgias.  However at some point in the last 10 months, the patient resumed his statin and is now currently taking Crestor 20 mg a day.  He discontinued his testosterone replacement and his most recent testosterone level was normal at 350.  His most recent lab work as listed below.  He is due for a diabetic foot exam, HIV screening, hepatitis C screening, and Pneumovax 23.  He states he has had a diabetic eye exam within the last year however we have no records of this.  This was done in January at his ophthalmologist office.  He denies any chest pain shortness of breath or dyspnea on exertion.  He denies any myalgias or right upper quadrant pain.  He does have a small black foreign object directly below the skin on the plantar aspect of his left first MTP joint.  It is approximately 1 mm in size.  Using an 18-gauge needle, I remove this and cover the area with Neosporin and a Band-Aid.  It appeared to be a small piece of metal/debris.  He denies any polyuria, polydipsia, blurry vision.  Since resuming his statin, he denies any myalgias or right upper quadrant pain.  He denies any neuropathy in his feet. Appointment on 02/28/2018  Component Date Value Ref Range Status  . WBC 02/28/2018 5.4  3.8 - 10.8 Thousand/uL Final  . RBC 02/28/2018 5.29  4.20 - 5.80 Million/uL Final  . Hemoglobin 02/28/2018 15.9  13.2 - 17.1 g/dL Final  . HCT 02/28/2018 45.4  38.5 - 50.0 % Final  . MCV 02/28/2018 85.8  80.0 - 100.0 fL Final  . MCH 02/28/2018 30.1  27.0 - 33.0 pg Final  . MCHC 02/28/2018 35.0  32.0 - 36.0 g/dL Final  . RDW 02/28/2018 12.0  11.0 - 15.0 % Final  . Platelets 02/28/2018 179  140 - 400 Thousand/uL Final  . MPV 02/28/2018 10.6  7.5 -  12.5 fL Final  . Neutro Abs 02/28/2018 3,424  1,500 - 7,800 cells/uL Final  . Lymphs Abs 02/28/2018 1,355  850 - 3,900 cells/uL Final  . WBC mixed population 02/28/2018 481  200 - 950 cells/uL Final  . Eosinophils Absolute 02/28/2018 119  15 - 500 cells/uL Final  . Basophils Absolute 02/28/2018 22  0 - 200 cells/uL Final  . Neutrophils Relative % 02/28/2018 63.4  % Final  . Total Lymphocyte 02/28/2018 25.1  % Final  . Monocytes Relative 02/28/2018 8.9  % Final  . Eosinophils Relative 02/28/2018 2.2  % Final  . Basophils Relative 02/28/2018 0.4  % Final  . Glucose, Bld 02/28/2018 153* 65 - 99 mg/dL Final   Comment: .            Fasting reference interval . For someone without known diabetes, a glucose value >125 mg/dL indicates that they may have diabetes and this should be confirmed with a follow-up test. .   . BUN 02/28/2018 15  7 - 25 mg/dL Final  . Creat 02/28/2018 0.85  0.70 - 1.25 mg/dL Final   Comment: For patients >70 years of age, the reference limit for Creatinine is approximately 13% higher for people identified as African-American. Marland Kitchen   Marland Kitchen  BUN/Creatinine Ratio 54/27/0623 NOT APPLICABLE  6 - 22 (calc) Final  . Sodium 02/28/2018 138  135 - 146 mmol/L Final  . Potassium 02/28/2018 4.3  3.5 - 5.3 mmol/L Final  . Chloride 02/28/2018 103  98 - 110 mmol/L Final  . CO2 02/28/2018 28  20 - 32 mmol/L Final  . Calcium 02/28/2018 9.6  8.6 - 10.3 mg/dL Final  . Total Protein 02/28/2018 6.5  6.1 - 8.1 g/dL Final  . Albumin 02/28/2018 4.4  3.6 - 5.1 g/dL Final  . Globulin 02/28/2018 2.1  1.9 - 3.7 g/dL (calc) Final  . AG Ratio 02/28/2018 2.1  1.0 - 2.5 (calc) Final  . Total Bilirubin 02/28/2018 0.7  0.2 - 1.2 mg/dL Final  . Alkaline phosphatase (APISO) 02/28/2018 56  40 - 115 U/L Final  . AST 02/28/2018 14  10 - 35 U/L Final  . ALT 02/28/2018 24  9 - 46 U/L Final  . Hgb A1c MFr Bld 02/28/2018 7.0* <5.7 % of total Hgb Final   Comment: For someone without known diabetes, a  hemoglobin A1c value of 6.5% or greater indicates that they may have  diabetes and this should be confirmed with a follow-up  test. . For someone with known diabetes, a value <7% indicates  that their diabetes is well controlled and a value  greater than or equal to 7% indicates suboptimal  control. A1c targets should be individualized based on  duration of diabetes, age, comorbid conditions, and  other considerations. . Currently, no consensus exists regarding use of hemoglobin A1c for diagnosis of diabetes for children. .   . Mean Plasma Glucose 02/28/2018 154  (calc) Final  . eAG (mmol/L) 02/28/2018 8.5  (calc) Final  . Cholesterol 02/28/2018 160  <200 mg/dL Final  . HDL 02/28/2018 39* >40 mg/dL Final  . Triglycerides 02/28/2018 132  <150 mg/dL Final  . LDL Cholesterol (Calc) 02/28/2018 98  mg/dL (calc) Final   Comment: Reference range: <100 . Desirable range <100 mg/dL for primary prevention;   <70 mg/dL for patients with CHD or diabetic patients  with > or = 2 CHD risk factors. Marland Kitchen LDL-C is now calculated using the Martin-Hopkins  calculation, which is a validated novel method providing  better accuracy than the Friedewald equation in the  estimation of LDL-C.  Cresenciano Genre et al. Annamaria Helling. 7628;315(17): 2061-2068  (http://education.[facility].com/faq/FAQ164)   . Total CHOL/HDL Ratio 02/28/2018 4.1  <5.0 (calc) Final  . Non-HDL Cholesterol (Calc) 02/28/2018 121  <130 mg/dL (calc) Final   Comment: For patients with diabetes plus 1 major ASCVD risk  factor, treating to a non-HDL-C goal of <100 mg/dL  (LDL-C of <70 mg/dL) is considered a therapeutic  option.   . Testosterone 02/28/2018 350  250 - 827 ng/dL Final     Past Medical History:  Diagnosis Date  . Atrial flutter ([place])   . Diabetes mellitus without complication ([place])   . Hyperlipidemia   . Hypertension    Past Surgical History:  Procedure Laterality Date  . CARDIOVERSION N/A 07/26/2016   Procedure:  CARDIOVERSION;  Surgeon: Adrian Prows, MD;  Location: [facility] ENDOSCOPY;  Service: Cardiovascular;  Laterality: N/A;   Current Outpatient Medications on File Prior to Visit  Medication Sig Dispense Refill  . amLODipine (NORVASC) 10 MG tablet Take 10 mg by mouth daily.  6  . CINNAMON PO Take by mouth.    . flecainide (TAMBOCOR) 50 MG tablet Take 1 tablet (50 mg total) by mouth 2 (two) times daily. 60 tablet  1  . fluticasone (FLONASE) 50 MCG/ACT nasal spray Place 2 sprays into both nostrils daily.    . INVOKANA 300 MG TABS tablet TAKE 1 TABLET BY MOUTH EVERY DAY BEFORE BREAKFAST 90 tablet 1  . lisinopril (PRINIVIL,ZESTRIL) 40 MG tablet Take 40 mg by mouth daily.  4  . metFORMIN (GLUCOPHAGE) 1000 MG tablet TAKE 1 TABLET BY MOUTH TWICE A DAY 180 tablet 1  . metoprolol succinate (TOPROL-XL) 50 MG 24 hr tablet TAKE 1 TABLET BY MOUTH EVERY DAY WITH OR IMMEDIATELY FOLLOWING A MEAL 90 tablet 0  . Omega-3 Fatty Acids (FISH OIL) 1200 MG CAPS Take 3 capsules by mouth daily.    . rosuvastatin (CRESTOR) 40 MG tablet Take 0.5 tablets (20 mg total) by mouth daily. Needs office visit and labs before further refills 90 tablet 0  . sitaGLIPtin (JANUVIA) 100 MG tablet TAKE 1 TABLET BY MOUTH EVERY DAY. NEEDS OFFICE VISIT FOR REFILLS 30 tablet 0  . SYRINGE-NEEDLE, DISP, 3 ML (LUER LOCK SAFETY SYRINGES) 23G X 1" 3 ML MISC Use with testosterone injections 50 each 3  . vitamin C (ASCORBIC ACID) 500 MG tablet Take 500 mg by mouth daily.    Alveda Reasons 20 MG TABS tablet Take 20 mg by mouth daily.  2  . testosterone cypionate (DEPOTESTOSTERONE CYPIONATE) 200 MG/ML injection Inject 1 mL (200 mg total) into the muscle every 14 (fourteen) days. (Patient not taking: Reported on 03/02/2018) 10 mL 0   Current Facility-Administered Medications on File Prior to Visit  Medication Dose Route Frequency Provider Last Rate Last Dose  . testosterone cypionate (DEPOTESTOSTERONE CYPIONATE) injection 200 mg  200 mg Intramuscular Q14 Days Susy Frizzle, MD   200 mg at 07/20/17 1647    Allergies  Allergen Reactions  . Lipitor [Atorvastatin]     Myalgias  . Penicillins Rash   Social History   Socioeconomic History  . Marital status: Married    Spouse name: Not on file  . Number of children: Not on file  . Years of education: Not on file  . Highest education level: Not on file  Occupational History  . Not on file  Social Needs  . Financial resource strain: Not on file  . Food insecurity:    Worry: Not on file    Inability: Not on file  . Transportation needs:    Medical: Not on file    Non-medical: Not on file  Tobacco Use  . Smoking status: Never Smoker  . Smokeless tobacco: Former Systems developer    Types: Chew  . Tobacco comment: quit 2002  Substance and Sexual Activity  . Alcohol use: Yes    Comment: Occasional  . Drug use: No  . Sexual activity: Not on file  Lifestyle  . Physical activity:    Days per week: Not on file    Minutes per session: Not on file  . Stress: Not on file  Relationships  . Social connections:    Talks on phone: Not on file    Gets together: Not on file    Attends religious service: Not on file    Active member of club or organization: Not on file    Attends meetings of clubs or organizations: Not on file    Relationship status: Not on file  . Intimate partner violence:    Fear of current or ex partner: Not on file    Emotionally abused: Not on file    Physically abused: Not on file    Forced  sexual activity: Not on file  Other Topics Concern  . Not on file  Social History Narrative  . Not on file      Review of Systems  All other systems reviewed and are negative.      Objective:   Physical Exam  Neck: Neck supple. No JVD present.  Cardiovascular: Normal rate, regular rhythm and normal heart sounds.  Pulmonary/Chest: Effort normal and breath sounds normal. No respiratory distress. He has no wheezes. He has no rales.  Abdominal: Soft. Bowel sounds are normal. He exhibits  no distension and no mass. There is no tenderness. There is no rebound and no guarding.  Musculoskeletal: He exhibits no edema.  Vitals reviewed.       Assessment & Plan:  Controlled type 2 diabetes mellitus without complication, without long-term current use of insulin (HCC)  Benign essential HTN  Pure hypercholesterolemia  Hypogonadism in male Patient's blood pressure is acceptable.  I will make no changes in his medication.  His cholesterol is acceptable with his LDL cholesterol less than 100.  His hemoglobin A1c has room for improvement.  Ideally I like to see it less than 6.5.  I have recommended therapeutic lifestyle changes and spent more than 20 minutes today with the patient discussing dietary changes including a low carbohydrate diet, 30 minutes of aerobic exercise a day, and weight loss.  I have recommended the patient to receive Pneumovax 23.  Patient has already had a diabetic eye exam.  Patient's diabetic foot exam is performed today and is normal.  I will screen him for hepatitis C and HIV.  Remainder of his preventative care is up-to-date.

## 2018-03-03 LAB — HIV ANTIBODY (ROUTINE TESTING W REFLEX): HIV: NONREACTIVE

## 2018-03-03 LAB — COMPREHENSIVE METABOLIC PANEL
AG RATIO: 2.1 (calc) (ref 1.0–2.5)
ALBUMIN MSPROF: 4.4 g/dL (ref 3.6–5.1)
ALT: 24 U/L (ref 9–46)
AST: 14 U/L (ref 10–35)
Alkaline phosphatase (APISO): 56 U/L (ref 40–115)
BILIRUBIN TOTAL: 0.7 mg/dL (ref 0.2–1.2)
BUN: 15 mg/dL (ref 7–25)
CALCIUM: 9.6 mg/dL (ref 8.6–10.3)
CO2: 28 mmol/L (ref 20–32)
Chloride: 103 mmol/L (ref 98–110)
Creat: 0.85 mg/dL (ref 0.70–1.25)
GLOBULIN: 2.1 g/dL (ref 1.9–3.7)
Glucose, Bld: 153 mg/dL — ABNORMAL HIGH (ref 65–99)
POTASSIUM: 4.3 mmol/L (ref 3.5–5.3)
SODIUM: 138 mmol/L (ref 135–146)
TOTAL PROTEIN: 6.5 g/dL (ref 6.1–8.1)

## 2018-03-03 LAB — CBC WITH DIFFERENTIAL/PLATELET
BASOS PCT: 0.4 %
Basophils Absolute: 22 cells/uL (ref 0–200)
EOS PCT: 2.2 %
Eosinophils Absolute: 119 cells/uL (ref 15–500)
HEMATOCRIT: 45.4 % (ref 38.5–50.0)
HEMOGLOBIN: 15.9 g/dL (ref 13.2–17.1)
LYMPHS ABS: 1355 {cells}/uL (ref 850–3900)
MCH: 30.1 pg (ref 27.0–33.0)
MCHC: 35 g/dL (ref 32.0–36.0)
MCV: 85.8 fL (ref 80.0–100.0)
MPV: 10.6 fL (ref 7.5–12.5)
Monocytes Relative: 8.9 %
NEUTROS ABS: 3424 {cells}/uL (ref 1500–7800)
Neutrophils Relative %: 63.4 %
Platelets: 179 10*3/uL (ref 140–400)
RBC: 5.29 10*6/uL (ref 4.20–5.80)
RDW: 12 % (ref 11.0–15.0)
Total Lymphocyte: 25.1 %
WBC mixed population: 481 cells/uL (ref 200–950)
WBC: 5.4 10*3/uL (ref 3.8–10.8)

## 2018-03-03 LAB — LIPID PANEL
Cholesterol: 160 mg/dL (ref <200)
HDL: 39 mg/dL — ABNORMAL LOW (ref >40)
LDL Cholesterol (Calc): 98 mg/dL (calc)
NON-HDL CHOLESTEROL (CALC): 121 mg/dL (ref <130)
Total CHOL/HDL Ratio: 4.1 (calc) (ref <5.0)
Triglycerides: 132 mg/dL (ref <150)

## 2018-03-03 LAB — TEST AUTHORIZATION

## 2018-03-03 LAB — HEPATITIS C ANTIBODY
Hepatitis C Ab: NONREACTIVE
SIGNAL TO CUT-OFF: 0.03 (ref <1.00)

## 2018-03-03 LAB — HEMOGLOBIN A1C
EAG (MMOL/L): 8.5 (calc)
HEMOGLOBIN A1C: 7 %{Hb} — AB (ref <5.7)
Mean Plasma Glucose: 154 (calc)

## 2018-03-03 LAB — TESTOSTERONE: TESTOSTERONE: 350 ng/dL (ref 250–827)

## 2018-03-05 ENCOUNTER — Telehealth: Payer: Self-pay | Admitting: Family Medicine

## 2018-03-05 DIAGNOSIS — Z8679 Personal history of other diseases of the circulatory system: Secondary | ICD-10-CM | POA: Diagnosis not present

## 2018-03-05 DIAGNOSIS — I472 Ventricular tachycardia: Secondary | ICD-10-CM | POA: Diagnosis not present

## 2018-03-05 DIAGNOSIS — I10 Essential (primary) hypertension: Secondary | ICD-10-CM | POA: Diagnosis not present

## 2018-03-05 NOTE — Telephone Encounter (Signed)
Pt came into office to get copy of labs and was informed of the incorrect immunization he received and has not had any adverse side effects or reactions to the injection. Informed pt that he could come back in 4 week to have the pneumovax 23 given.

## 2018-03-05 NOTE — Telephone Encounter (Signed)
While pt was in on 03/02/18 he needed a pneumovax 23 injection and inadvertently received a meningitis injection instead. On 03/02/18 pt was called and had to leave message on vm to call our office back. Per Dr. Buelah Manis no harm in the injection however pt will need to wait 4 weeks before getting the Pneumovax 23 injection.

## 2018-03-22 ENCOUNTER — Other Ambulatory Visit: Payer: Self-pay | Admitting: Family Medicine

## 2018-03-23 ENCOUNTER — Other Ambulatory Visit: Payer: Self-pay | Admitting: Family Medicine

## 2018-04-16 ENCOUNTER — Encounter: Payer: Self-pay | Admitting: Podiatry

## 2018-04-16 ENCOUNTER — Ambulatory Visit (INDEPENDENT_AMBULATORY_CARE_PROVIDER_SITE_OTHER): Payer: 59

## 2018-04-16 ENCOUNTER — Ambulatory Visit: Payer: 59 | Admitting: Podiatry

## 2018-04-16 VITALS — BP 150/86 | HR 71

## 2018-04-16 DIAGNOSIS — G5791 Unspecified mononeuropathy of right lower limb: Secondary | ICD-10-CM

## 2018-04-16 DIAGNOSIS — Z23 Encounter for immunization: Secondary | ICD-10-CM | POA: Diagnosis not present

## 2018-04-16 DIAGNOSIS — M7751 Other enthesopathy of right foot: Secondary | ICD-10-CM | POA: Diagnosis not present

## 2018-04-16 DIAGNOSIS — R52 Pain, unspecified: Secondary | ICD-10-CM | POA: Diagnosis not present

## 2018-04-16 MED ORDER — MELOXICAM 15 MG PO TABS: 15.0000 mg | ORAL_TABLET | Freq: Every day | ORAL | 1 refills | Status: AC

## 2018-04-16 NOTE — Progress Notes (Signed)
Patient came in to get a pneumovax 23 vaccine. Pneumovax 23 was given in the left deltoid muscle. Patient tolerated well.

## 2018-04-23 NOTE — Progress Notes (Signed)
   HPI: 62 year old male with PMHx of DM presenting today as a new patient with a chief complaint of right foot pain that began one month ago. He reports intermittent burning pain to the medial side of the right hallux with associated swelling. He states he was climbing into his boat when the pain began. Walking and standing increases the pain. He has been icing the area for treatment with some relief. Patient is here for further evaluation and treatment.   Past Medical History:  Diagnosis Date  . Atrial flutter ([place])   . Diabetes mellitus without complication ([place])   . Hyperlipidemia   . Hypertension      Physical Exam: General: The patient is alert and oriented x3 in no acute distress.  Dermatology: Skin is warm, dry and supple bilateral lower extremities. Negative for open lesions or macerations.  Vascular: Palpable pedal pulses bilaterally. No edema or erythema noted. Capillary refill within normal limits.  Neurological: Neuro: Epicritic and protective threshold intact.  Paresthesia with burning, shooting pain noted to the medial aspect of the 1st MPJ of the right foot.   Musculoskeletal Exam: Pain with palpation to the 1st MPJ of the right foot. Range of motion within normal limits to all pedal and ankle joints bilateral. Muscle strength 5/5 in all groups bilateral.   Radiographic Exam:  Normal osseous mineralization. Joint spaces preserved. No fracture/dislocation/boney destruction.    Assessment: 1. 1st MPJ capsulitis right 2. Neuritis right medial 1st MPJ   Plan of Care:  1. Patient evaluated. X-Rays reviewed.  2. Injection of 0.5 mLs Celestone Soluspan injected into the 1st MPJ of the right foot.  3. Prescription for Meloxicam provided to patient.  4. Recommended wide fitting shoes.  5. Return to clinic as needed.       Edrick Kins, DPM [facility]  Dr. Edrick Kins, DPM    [address],  [place] [address]                Office [phone]  Fax [phone]

## 2018-05-02 ENCOUNTER — Encounter: Payer: Self-pay | Admitting: Family Medicine

## 2018-05-02 ENCOUNTER — Ambulatory Visit: Payer: 59 | Admitting: Family Medicine

## 2018-05-02 VITALS — BP 140/80 | HR 60 | Temp 97.9°F | Resp 18 | Ht 76.0 in | Wt 259.0 lb

## 2018-05-02 DIAGNOSIS — J0140 Acute pansinusitis, unspecified: Secondary | ICD-10-CM | POA: Diagnosis not present

## 2018-05-02 DIAGNOSIS — R059 Cough, unspecified: Secondary | ICD-10-CM

## 2018-05-02 DIAGNOSIS — R05 Cough: Secondary | ICD-10-CM | POA: Diagnosis not present

## 2018-05-02 MED ORDER — BENZONATATE 100 MG PO CAPS: 100.0000 mg | ORAL_CAPSULE | Freq: Three times a day (TID) | ORAL | 0 refills | Status: DC | PRN

## 2018-05-02 MED ORDER — DOXYCYCLINE HYCLATE 100 MG PO TABS: 100.0000 mg | ORAL_TABLET | Freq: Two times a day (BID) | ORAL | 0 refills | Status: AC

## 2018-05-02 MED ORDER — PREDNISONE 20 MG PO TABS: 40.0000 mg | ORAL_TABLET | Freq: Every day | ORAL | 0 refills | Status: AC

## 2018-05-02 NOTE — Patient Instructions (Signed)
Add zyrtec, claritin or allegra - any of these or generic  Keep doing flonase  Start antibiotics and steroids  Come back if not feeling better in 5-7 days  Come back sooner if any wheeze or shortness of breath   Sinusitis, Adult Sinusitis is soreness and inflammation of your sinuses. Sinuses are hollow spaces in the bones around your face. Your sinuses are located:  Around your eyes.  In the middle of your forehead.  Behind your nose.  In your cheekbones.  Your sinuses and nasal passages are lined with a stringy fluid (mucus). Mucus normally drains out of your sinuses. When your nasal tissues become inflamed or swollen, the mucus can become trapped or blocked so air cannot flow through your sinuses. This allows bacteria, viruses, and funguses to grow, which leads to infection. Sinusitis can develop quickly and last for 7?10 days (acute) or for more than 12 weeks (chronic). Sinusitis often develops after a cold. What are the causes? This condition is caused by anything that creates swelling in the sinuses or stops mucus from draining, including:  Allergies.  Asthma.  Bacterial or viral infection.  Abnormally shaped bones between the nasal passages.  Nasal growths that contain mucus (nasal polyps).  Narrow sinus openings.  Pollutants, such as chemicals or irritants in the air.  A foreign object stuck in the nose.  A fungal infection. This is rare.  What increases the risk? The following factors may make you more likely to develop this condition:  Having allergies or asthma.  Having had a recent cold or respiratory tract infection.  Having structural deformities or blockages in your nose or sinuses.  Having a weak immune system.  Doing a lot of swimming or diving.  Overusing nasal sprays.  Smoking.  What are the signs or symptoms? The main symptoms of this condition are pain and a feeling of pressure around the affected sinuses. Other symptoms  include:  Upper toothache.  Earache.  Headache.  Bad breath.  Decreased sense of smell and taste.  A cough that may get worse at night.  Fatigue.  Fever.  Thick drainage from your nose. The drainage is often green and it may contain pus (purulent).  Stuffy nose or congestion.  Postnasal drip. This is when extra mucus collects in the throat or back of the nose.  Swelling and warmth over the affected sinuses.  Sore throat.  Sensitivity to light.  How is this diagnosed? This condition is diagnosed based on symptoms, a medical history, and a physical exam. To find out if your condition is acute or chronic, your health care provider may:  Look in your nose for signs of nasal polyps.  Tap over the affected sinus to check for signs of infection.  View the inside of your sinuses using an imaging device that has a light attached (endoscope).  If your health care provider suspects that you have chronic sinusitis, you may also:  Be tested for allergies.  Have a sample of mucus taken from your nose (nasal culture) and checked for bacteria.  Have a mucus sample examined to see if your sinusitis is related to an allergy.  If your sinusitis does not respond to treatment and it lasts longer than 8 weeks, you may have an MRI or CT scan to check your sinuses. These scans also help to determine how severe your infection is. In rare cases, a bone biopsy may be done to rule out more serious types of fungal sinus disease. How is this  treated? Treatment for sinusitis depends on the cause and whether your condition is chronic or acute. If a virus is causing your sinusitis, your symptoms will go away on their own within 10 days. You may be given medicines to relieve your symptoms, including:  Topical nasal decongestants. They shrink swollen nasal passages and let mucus drain from your sinuses.  Antihistamines. These drugs block inflammation that is triggered by allergies. This can help  to ease swelling in your nose and sinuses.  Topical nasal corticosteroids. These are nasal sprays that ease inflammation and swelling in your nose and sinuses.  Nasal saline washes. These rinses can help to get rid of thick mucus in your nose.  If your condition is caused by bacteria, you will be given an antibiotic medicine. If your condition is caused by a fungus, you will be given an antifungal medicine. Surgery may be needed to correct underlying conditions, such as narrow nasal passages. Surgery may also be needed to remove polyps. Follow these instructions at home: Medicines  Take, use, or apply over-the-counter and prescription medicines only as told by your health care provider. These may include nasal sprays.  If you were prescribed an antibiotic medicine, take it as told by your health care provider. Do not stop taking the antibiotic even if you start to feel better. Hydrate and Humidify  Drink enough water to keep your urine clear or pale yellow. Staying hydrated will help to thin your mucus.  Use a cool mist humidifier to keep the humidity level in your home above 50%.  Inhale steam for 10-15 minutes, 3-4 times a day or as told by your health care provider. You can do this in the bathroom while a hot shower is running.  Limit your exposure to cool or dry air. Rest  Rest as much as possible.  Sleep with your head raised (elevated).  Make sure to get enough sleep each night. General instructions  Apply a warm, moist washcloth to your face 3-4 times a day or as told by your health care provider. This will help with discomfort.  Wash your hands often with soap and water to reduce your exposure to viruses and other germs. If soap and water are not available, use hand sanitizer.  Do not smoke. Avoid being around people who are smoking (secondhand smoke).  Keep all follow-up visits as told by your health care provider. This is important. Contact a health care provider  if:  You have a fever.  Your symptoms get worse.  Your symptoms do not improve within 10 days. Get help right away if:  You have a severe headache.  You have persistent vomiting.  You have pain or swelling around your face or eyes.  You have vision problems.  You develop confusion.  Your neck is stiff.  You have trouble breathing. This information is not intended to replace advice given to you by your health care provider. Make sure you discuss any questions you have with your health care provider. Document Released: 09/19/2005 Document Revised: 05/15/2016 Document Reviewed: 07/15/2015 Elsevier Interactive Patient Education  [employer].

## 2018-05-02 NOTE — Progress Notes (Signed)
Patient ID: Todd Ellis, male    DOB: April 06, 1956, 62 y.o.   MRN: [id number]  PCP: Susy Frizzle, MD  Chief Complaint  Patient presents with  . Sinus pressure, HA, drainage in throat - ? sinus infection    Subjective:   Todd Ellis is a 62 y.o. male, presents to clinic with CC of URI symptoms for nearly 3 weeks.  He states that it has been "bothering him for a while' and has recently worsened with postnasal drip, causing him to constantly clear his throat the past 3 weeks and now he feels he is a sore throat described as scratchy.  He has sinus pain and pressure and headaches with his nasal congestion, pain is most severe and located in his temples and behind eyes, gradually getting worse with headaches, nagging HA across forehead. He continues to have a postnasal drip but he has had gradual onset of and worsening of chest congestion with productive cough with brown sputum with associated night sweats. No SOB, wheeze, chest tightness, CP,  Medicines - flonase Tylenol - for headaches  CPAP - OSA intermittent use and cleaning  No weight loss, no hemoptysis  No sick contacts    DM - reports HbA1C went up, does not check it at home, on metformin  Patient Active Problem List   Diagnosis Date Noted  . OSA on CPAP 10/13/2016  . Paroxysmal atrial fibrillation ([place]) 07/07/2016  . Fatigue due to excessive exertion 07/07/2016  . Snoring 07/07/2016  . Atrial flutter ([place])   . Hyperlipidemia   . Hypertension   . Diabetes mellitus without complication ([place])      Prior to Admission medications   Medication Sig Start Date End Date Taking? Authorizing Provider  amLODipine (NORVASC) 10 MG tablet Take 10 mg by mouth daily. 01/20/17  Yes [provider]  CINNAMON PO Take 1,000 mg by mouth 2 (two) times daily.    Yes [provider]  Cyanocobalamin (VITAMIN B 12 PO) Take 1,000 mg by mouth daily.   Yes [provider]  fluticasone (FLONASE) 50  MCG/ACT nasal spray Place 2 sprays into both nostrils daily.   Yes [provider]  INVOKANA 300 MG TABS tablet TAKE 1 TABLET BY MOUTH EVERY DAY BEFORE BREAKFAST 03/23/18  Yes Susy Frizzle, MD  lisinopril (PRINIVIL,ZESTRIL) 40 MG tablet Take 40 mg by mouth daily. 01/13/17  Yes [provider]  meloxicam (MOBIC) 15 MG tablet Take 1 tablet (15 mg total) by mouth daily. 04/16/18 05/16/18 Yes Edrick Kins, DPM  metFORMIN (GLUCOPHAGE) 1000 MG tablet TAKE 1 TABLET BY MOUTH TWICE A DAY 02/23/18  Yes Susy Frizzle, MD  metoprolol succinate (TOPROL-XL) 50 MG 24 hr tablet TAKE 1 TABLET BY MOUTH EVERY DAY WITH OR IMMEDIATELY FOLLOWING A MEAL 02/16/18  Yes Susy Frizzle, MD  Omega-3 Fatty Acids (FISH OIL) 1200 MG CAPS Take 3 capsules by mouth daily.   Yes [provider]  rosuvastatin (CRESTOR) 40 MG tablet Take 0.5 tablets (20 mg total) by mouth daily. Needs office visit and labs before further refills 01/01/18  Yes Pickard, Cammie Mcgee, MD  sitaGLIPtin (JANUVIA) 100 MG tablet TAKE 1 TABLET BY MOUTH EVERY DAY. 03/22/18  Yes Pickard, Cammie Mcgee, MD  SYRINGE-NEEDLE, DISP, 3 ML (LUER LOCK SAFETY SYRINGES) 23G X 1" 3 ML MISC Use with testosterone injections 04/18/17  Yes Susy Frizzle, MD  testosterone cypionate (DEPOTESTOSTERONE CYPIONATE) 200 MG/ML injection Inject 1 mL (200 mg total) into  the muscle every 14 (fourteen) days. 04/18/17  Yes Susy Frizzle, MD  vitamin C (ASCORBIC ACID) 500 MG tablet Take 500 mg by mouth daily.   Yes [provider]     Allergies  Allergen Reactions  . Lipitor [Atorvastatin]     Myalgias  . Penicillin G Rash  . Penicillins Rash     No family history on file.   Social History   Socioeconomic History  . Marital status: Married    Spouse name: Not on file  . Number of children: Not on file  . Years of education: Not on file  . Highest education level: Not on file  Occupational History  . Not on file  Social Needs  .  Financial resource strain: Not on file  . Food insecurity:    Worry: Not on file    Inability: Not on file  . Transportation needs:    Medical: Not on file    Non-medical: Not on file  Tobacco Use  . Smoking status: Never Smoker  . Smokeless tobacco: Former Systems developer    Types: Chew  . Tobacco comment: quit 2002  Substance and Sexual Activity  . Alcohol use: Yes    Comment: Occasional  . Drug use: No  . Sexual activity: Not on file  Lifestyle  . Physical activity:    Days per week: Not on file    Minutes per session: Not on file  . Stress: Not on file  Relationships  . Social connections:    Talks on phone: Not on file    Gets together: Not on file    Attends religious service: Not on file    Active member of club or organization: Not on file    Attends meetings of clubs or organizations: Not on file    Relationship status: Not on file  . Intimate partner violence:    Fear of current or ex partner: Not on file    Emotionally abused: Not on file    Physically abused: Not on file    Forced sexual activity: Not on file  Other Topics Concern  . Not on file  Social History Narrative  . Not on file    10 Systems reviewed and are negative for acute change except as noted in the HPI. Review of Systems  Constitutional: Negative.   HENT: Negative.   Eyes: Negative.   Respiratory: Negative.   Cardiovascular: Negative.   Gastrointestinal: Negative.   Endocrine: Negative.   Genitourinary: Negative.   Musculoskeletal: Negative.   Skin: Negative.   Allergic/Immunologic: Negative.   Neurological: Negative.   Hematological: Negative.   Psychiatric/Behavioral: Negative.   All other systems reviewed and are negative.      Objective:    Vitals:   05/02/18 0941  BP: 140/80  Pulse: 60  Resp: 18  Temp: 97.9 F (36.6 C)  TempSrc: Oral  SpO2: 98%  Weight: 259 lb (117.5 kg)  Height: 6\' 4"  (1.93 m)      Physical Exam  Constitutional: He is oriented to person, place, and  time. He appears well-developed and well-nourished.  Non-toxic appearance. He does not appear ill. No distress.  HENT:  Head: Normocephalic and atraumatic. Head is without right periorbital erythema and without left periorbital erythema.  Right Ear: Tympanic membrane, external ear and ear canal normal.  Left Ear: Tympanic membrane, external ear and ear canal normal.  Nose: Mucosal edema and rhinorrhea present. Right sinus exhibits maxillary sinus tenderness and frontal sinus tenderness. Left sinus  exhibits maxillary sinus tenderness and frontal sinus tenderness.  Mouth/Throat: Uvula is midline and mucous membranes are normal. Mucous membranes are not pale, not dry and not cyanotic. No trismus in the jaw. No uvula swelling. Posterior oropharyngeal erythema present. No oropharyngeal exudate, posterior oropharyngeal edema or tonsillar abscesses.  Eyes: Pupils are equal, round, and reactive to light. Conjunctivae, EOM and lids are normal.  Neck: Trachea normal, normal range of motion and phonation normal. Neck supple. No tracheal deviation present.  Cardiovascular: Regular rhythm, normal heart sounds and normal pulses. Exam reveals no gallop and no friction rub.  No murmur heard. Pulses:      Radial pulses are 2+ on the right side, and 2+ on the left side.       Posterior tibial pulses are 2+ on the right side, and 2+ on the left side.  Pulmonary/Chest: Effort normal and breath sounds normal. He has no decreased breath sounds. He has no wheezes. He has no rhonchi. He has no rales.  Intermittent coughing, no distress  Abdominal: Soft. Normal appearance and bowel sounds are normal. He exhibits no distension. There is no tenderness. There is no rebound and no guarding.  Musculoskeletal: Normal range of motion. He exhibits no edema.  Neurological: He is alert and oriented to person, place, and time. Gait normal.  Skin: Skin is warm, dry and intact. Capillary refill takes less than 2 seconds. No rash  noted. He is not diaphoretic.  Psychiatric: He has a normal mood and affect. His speech is normal and behavior is normal.  Nursing note and vitals reviewed.         Assessment & Plan:      ICD-10-CM   1. Acute non-recurrent pansinusitis J01.40 doxycycline (VIBRA-TABS) 100 MG tablet    predniSONE (DELTASONE) 20 MG tablet   gradually worsening x 3 weeks, indication for abx tx, tenderness on exam to sinuses, continue flonase, add antihistamine  2. Cough R05 predniSONE (DELTASONE) 20 MG tablet    benzonatate (TESSALON) 100 MG capsule   likely URI/secondary to post nasal drip vs early bronchitis, lungs CTA, treating sinusitis and post nasal drip, Rx for prednisone burst and tessalon for cough    Patient with several weeks of URI symptoms, continues to have sinus congestion with worsening pain, has developed chest congestion with productive cough, denies shortness of breath, wheeze, chest pain, fever.  Nontoxic-appearing on exam without any distress, vital signs stable, frequent coughing, physical exam consistent with sinusitis/URI/bronchitis.  Patient stated that he has diabetes and is only on metformin he does not check his sugars however after reviewing the chart he is on multiple medications and he has had worsening A1c with his most recent labs.  Feel that he could benefit greatly from a short steroid burst however patient was instructed to monitor sugars while on steroids.  He currently does not check.  F/up if worsening.  F/up with Sugars > 300 with urinary frequency, nocturia, polydipsia. F/up with any worsening of cough/breathing  Go to ER with SOB, CP   Delsa Grana, PA-C 05/02/18 10:06 AM

## 2018-06-13 ENCOUNTER — Other Ambulatory Visit: Payer: Self-pay | Admitting: Family Medicine

## 2018-06-13 DIAGNOSIS — R000 Tachycardia, unspecified: Secondary | ICD-10-CM

## 2018-06-14 DIAGNOSIS — Z23 Encounter for immunization: Secondary | ICD-10-CM | POA: Diagnosis not present

## 2018-07-09 DIAGNOSIS — Z8679 Personal history of other diseases of the circulatory system: Secondary | ICD-10-CM | POA: Diagnosis not present

## 2018-07-09 DIAGNOSIS — I472 Ventricular tachycardia: Secondary | ICD-10-CM | POA: Diagnosis not present

## 2018-07-09 DIAGNOSIS — I119 Hypertensive heart disease without heart failure: Secondary | ICD-10-CM | POA: Diagnosis not present

## 2018-07-25 ENCOUNTER — Ambulatory Visit: Payer: 59 | Admitting: Family Medicine

## 2018-07-25 ENCOUNTER — Other Ambulatory Visit: Payer: Self-pay

## 2018-07-25 ENCOUNTER — Encounter: Payer: 59 | Admitting: Family Medicine

## 2018-07-25 ENCOUNTER — Encounter: Payer: Self-pay | Admitting: Family Medicine

## 2018-07-25 VITALS — BP 138/78 | HR 60 | Temp 97.9°F | Resp 14 | Ht 76.0 in | Wt 256.0 lb

## 2018-07-25 DIAGNOSIS — M545 Low back pain, unspecified: Secondary | ICD-10-CM

## 2018-07-25 MED ORDER — NAPROXEN 500 MG PO TABS: 500.0000 mg | ORAL_TABLET | Freq: Two times a day (BID) | ORAL | 0 refills | Status: DC

## 2018-07-25 MED ORDER — PREDNISONE 20 MG PO TABS: 40.0000 mg | ORAL_TABLET | Freq: Every day | ORAL | 0 refills | Status: AC

## 2018-07-25 MED ORDER — TIZANIDINE HCL 4 MG PO TABS: 4.0000 mg | ORAL_TABLET | Freq: Four times a day (QID) | ORAL | 0 refills | Status: DC | PRN

## 2018-07-25 NOTE — Progress Notes (Signed)
Patient ID: Todd Ellis, male    DOB: 12-25-1955, 62 y.o.   MRN: [id number]  PCP: Susy Frizzle, MD  Chief Complaint  Patient presents with  . Lumbar Back Pain    x2 days- radiation to lower legs    Subjective:   Todd Ellis is a 62 y.o. male, presents to clinic with CC of pain to low back radiating to both legs, onset acutely yesterday after getting up from his desk at lunchtime.  He states that his low back hurt radiated across his low back and he could not stand up straight, was fairly constant throughout the day and last night he couldn't sleep cause of pain.  Pain described as tender, tight, exacerbated with bending over and stand up straight and arch his back.  No alleviating factors, no over-the-counter medications tried.  Yesterday his pain was fairly moderate to severe and intermittent with radiation to his legs however it is slightly better this morning, currently has no leg pain and only mild low back pain with certain movements, pain intermittent. Denies any numbness or tingling in his legs, focal weakness, incontinence of urine or stool, abdominal pain, flank pain, urinary symptoms, fevers, chills, sweats, nausea, vomiting. Patient has history of hypertension, diabetes, hyperlipidemia, proximal A. fib and is obstructive sleep apnea on CPAP.  He denies any history of prolonged steroid use, personal cancer history, IV drug use.   Patient Active Problem List   Diagnosis Date Noted  . OSA on CPAP 10/13/2016  . Paroxysmal atrial fibrillation ([place]) 07/07/2016  . Fatigue due to excessive exertion 07/07/2016  . Snoring 07/07/2016  . Atrial flutter ([place])   . Hyperlipidemia   . Hypertension   . Diabetes mellitus without complication ([place])      Prior to Admission medications   Medication Sig Start Date End Date Taking? Authorizing Provider  amLODipine (NORVASC) 10 MG tablet Take 10 mg by mouth daily. 01/20/17  Yes [provider]  benzonatate  (TESSALON) 100 MG capsule Take 1 capsule (100 mg total) by mouth 3 (three) times daily as needed for cough. 05/02/18  Yes Delsa Grana, PA-C  CINNAMON PO Take 1,000 mg by mouth 2 (two) times daily.    Yes [provider]  Cyanocobalamin (VITAMIN B 12 PO) Take 1,000 mg by mouth daily.   Yes [provider]  fluticasone (FLONASE) 50 MCG/ACT nasal spray Place 2 sprays into both nostrils daily.   Yes [provider]  INVOKANA 300 MG TABS tablet TAKE 1 TABLET BY MOUTH EVERY DAY BEFORE BREAKFAST 03/23/18  Yes Susy Frizzle, MD  lisinopril (PRINIVIL,ZESTRIL) 40 MG tablet Take 40 mg by mouth daily. 01/13/17  Yes [provider]  metFORMIN (GLUCOPHAGE) 1000 MG tablet TAKE 1 TABLET BY MOUTH TWICE A DAY 02/23/18  Yes Susy Frizzle, MD  metoprolol succinate (TOPROL-XL) 50 MG 24 hr tablet TAKE 1 TABLET BY MOUTH EVERY DAY WITH OR IMMEDIATELY FOLLOWING A MEAL 06/14/18  Yes Susy Frizzle, MD  Omega-3 Fatty Acids (FISH OIL) 1200 MG CAPS Take 3 capsules by mouth daily.   Yes [provider]  rosuvastatin (CRESTOR) 40 MG tablet Take 0.5 tablets (20 mg total) by mouth daily. 06/14/18  Yes Susy Frizzle, MD  sitaGLIPtin (JANUVIA) 100 MG tablet TAKE 1 TABLET BY MOUTH EVERY DAY 06/14/18  Yes Pickard, Cammie Mcgee, MD  SYRINGE-NEEDLE, DISP, 3 ML (LUER LOCK SAFETY SYRINGES) 23G X 1" 3 ML MISC Use with testosterone injections 04/18/17  Yes Pickard,  Cammie Mcgee, MD  testosterone cypionate (DEPOTESTOSTERONE CYPIONATE) 200 MG/ML injection Inject 1 mL (200 mg total) into the muscle every 14 (fourteen) days. 04/18/17  Yes Susy Frizzle, MD  vitamin C (ASCORBIC ACID) 500 MG tablet Take 500 mg by mouth daily.   Yes [provider]  spironolactone (ALDACTONE) 25 MG tablet Take 25 mg by mouth daily. 07/09/18   [provider]     Allergies  Allergen Reactions  . Lipitor [Atorvastatin]     Myalgias  . Penicillin G Rash  . Penicillins Rash     History reviewed.  No pertinent family history.   Social History   Socioeconomic History  . Marital status: Married    Spouse name: Not on file  . Number of children: Not on file  . Years of education: Not on file  . Highest education level: Not on file  Occupational History  . Not on file  Social Needs  . Financial resource strain: Not on file  . Food insecurity:    Worry: Not on file    Inability: Not on file  . Transportation needs:    Medical: Not on file    Non-medical: Not on file  Tobacco Use  . Smoking status: Never Smoker  . Smokeless tobacco: Former Systems developer    Types: Chew  . Tobacco comment: quit 2002  Substance and Sexual Activity  . Alcohol use: Yes    Comment: Occasional  . Drug use: No  . Sexual activity: Not on file  Lifestyle  . Physical activity:    Days per week: Not on file    Minutes per session: Not on file  . Stress: Not on file  Relationships  . Social connections:    Talks on phone: Not on file    Gets together: Not on file    Attends religious service: Not on file    Active member of club or organization: Not on file    Attends meetings of clubs or organizations: Not on file    Relationship status: Not on file  . Intimate partner violence:    Fear of current or ex partner: Not on file    Emotionally abused: Not on file    Physically abused: Not on file    Forced sexual activity: Not on file  Other Topics Concern  . Not on file  Social History Narrative  . Not on file     Review of Systems  Constitutional: Negative.   HENT: Negative.   Eyes: Negative.   Respiratory: Negative.   Cardiovascular: Negative.   Gastrointestinal: Negative.   Endocrine: Negative.   Genitourinary: Negative.   Musculoskeletal: Negative.   Skin: Negative.   Allergic/Immunologic: Negative.   Neurological: Negative.   Hematological: Negative.   Psychiatric/Behavioral: Negative.   10 Systems reviewed and are negative for acute change except as noted in the HPI.        Objective:    Vitals:   07/25/18 1033  BP: 138/78  Pulse: 60  Resp: 14  Temp: 97.9 F (36.6 C)  TempSrc: Oral  SpO2: 96%  Weight: 256 lb (116.1 kg)  Height: 6\' 4"  (1.93 m)      Physical Exam  Constitutional: He appears well-developed and well-nourished. No distress.  HENT:  Head: Normocephalic and atraumatic.  Right Ear: External ear normal.  Left Ear: External ear normal.  Nose: Nose normal.  Mouth/Throat: Oropharynx is clear and moist.  Eyes: Conjunctivae are normal. Right eye exhibits no discharge. Left eye exhibits  no discharge. No scleral icterus.  Neck: Normal range of motion. Neck supple. No tracheal deviation present.  Cardiovascular: Normal rate, regular rhythm, normal heart sounds and intact distal pulses. Exam reveals no gallop and no friction rub.  No murmur heard. Pulmonary/Chest: Effort normal and breath sounds normal. No stridor. No respiratory distress. He has no wheezes.  Abdominal: Soft. Bowel sounds are normal. He exhibits no distension and no mass. There is no tenderness. There is no guarding.  No CVA tenderness bilaterally  Musculoskeletal: Normal range of motion. He exhibits tenderness.       Cervical back: Normal.       Thoracic back: Normal.       Lumbar back: He exhibits tenderness. He exhibits no swelling, no edema, no deformity, no laceration and no spasm.       Back:  ttp to low lumbar to sacral paraspinal muscles + SLR bilaterally Normal ROM, but some hesitation with forward flexion  Neurological: He is alert. He has normal strength. He displays no tremor. No sensory deficit. He exhibits normal muscle tone. Coordination and gait normal.  Normal sensation to light touch b/l 5/5 strength with dorsiflexion and plantar flexion Normal gait  Skin: Skin is warm and dry. Capillary refill takes less than 2 seconds. No rash noted. He is not diaphoretic.  Psychiatric: He has a normal mood and affect. His behavior is normal. Judgment and thought  content normal.  Nursing note and vitals reviewed.         Assessment & Plan:      ICD-10-CM   1. Back pain, lumbosacral M54.5 tiZANidine (ZANAFLEX) 4 MG tablet    predniSONE (DELTASONE) 20 MG tablet    naproxen (NAPROSYN) 500 MG tablet    Patient with acute back pain onset yesterday, has gradually improved currently without any radicular symptoms, negative straight leg raise, no red flags.  Will start treatment with muscle relaxer and naproxen, patient prescribed prednisone but instructed to hold and only start if he has any radiation of pain down his legs again, patient was encouraged to hold over-the-counter NSAIDs or naproxen if he starts prednisone burst.  Discussed rest, heat therapy, gentle stretching, expected healing time which could be 1 to 2 weeks or 1 to 2 months.  If patient is not feeling much better in 2 weeks I have advised physical therapy will be very helpful for him, he currently does not want PT ordered.  Encouraged to call or follow-up in 2 to 4 weeks if not improving If we end up doing PT - [place]  Delsa Grana, PA-C 07/25/18 10:48 AM

## 2018-07-25 NOTE — Patient Instructions (Addendum)
If you have shooting or burning back radiate down legs start the steroids and HOLD the NSAIDS (aleve naproxen or ibuprofen)   Call me in one week to check in and if not getting better and we will put in an order for physical therapy  Get a heating pad, change positions frequently, no heavy lifting   Heat Therapy Heat therapy can help ease sore, stiff, injured, and tight muscles and joints. Heat relaxes your muscles, which may help ease your pain. Heat therapy should only be used on old, pre-existing, or long-lasting (chronic) injuries. Do not use heat therapy unless told by your doctor. How to use heat therapy There are several different kinds of heat therapy, including:  Moist heat pack.  Warm water bath.  Hot water bottle.  Electric heating pad.  Heated gel pack.  Heated wrap.  Electric heating pad.  General heat therapy recommendations  Do not sleep while using heat therapy. Only use heat therapy while you are awake.  Your skin may turn pink while using heat therapy. Do not use heat therapy if your skin turns red.  Do not use heat therapy if you have new pain.  High heat or long exposure to heat can cause burns. Be careful when using heat therapy to avoid burning your skin.  Do not use heat therapy on areas of your skin that are already irritated, such as with a rash or sunburn. Get help if:  You have blisters, redness, swelling (puffiness), or numbness.  You have new pain.  Your pain is worse. This information is not intended to replace advice given to you by your health care provider. Make sure you discuss any questions you have with your health care provider. Document Released: 12/12/2011 Document Revised: 02/25/2016 Document Reviewed: 11/12/2013 Elsevier Interactive Patient Education  2018 [place].   Back Pain, Adult Back pain is very common. The pain often gets better over time. The cause of back pain is usually not dangerous. Most people can learn to  manage their back pain on their own. Follow these instructions at home: Watch your back pain for any changes. The following actions may help to lessen any pain you are feeling:  Stay active. Start with short walks on flat ground if you can. Try to walk farther each day.  Exercise regularly as told by your doctor. Exercise helps your back heal faster. It also helps avoid future injury by keeping your muscles strong and flexible.  Do not sit, drive, or stand in one place for more than 30 minutes.  Do not stay in bed. Resting more than 1-2 days can slow down your recovery.  Be careful when you bend or lift an object. Use good form when lifting: ? Bend at your knees. ? Keep the object close to your body. ? Do not twist.  Sleep on a firm mattress. Lie on your side, and bend your knees. If you lie on your back, put a pillow under your knees.  Take medicines only as told by your doctor.  Put ice on the injured area. ? Put ice in a plastic bag. ? Place a towel between your skin and the bag. ? Leave the ice on for 20 minutes, 2-3 times a day for the first 2-3 days. After that, you can switch between ice and heat packs.  Avoid feeling anxious or stressed. Find good ways to deal with stress, such as exercise.  Maintain a healthy weight. Extra weight puts stress on your back.  Contact  a doctor if:  You have pain that does not go away with rest or medicine.  You have worsening pain that goes down into your legs or buttocks.  You have pain that does not get better in one week.  You have pain at night.  You lose weight.  You have a fever or chills. Get help right away if:  You cannot control when you poop (bowel movement) or pee (urinate).  Your arms or legs feel weak.  Your arms or legs lose feeling (numbness).  You feel sick to your stomach (nauseous) or throw up (vomit).  You have belly (abdominal) pain.  You feel like you may pass out (faint). This information is not  intended to replace advice given to you by your health care provider. Make sure you discuss any questions you have with your health care provider. Document Released: 03/07/2008 Document Revised: 02/25/2016 Document Reviewed: 01/21/2014 Elsevier Interactive Patient Education  [employer].

## 2018-08-06 ENCOUNTER — Encounter: Payer: Self-pay | Admitting: Family Medicine

## 2018-08-23 DIAGNOSIS — I119 Hypertensive heart disease without heart failure: Secondary | ICD-10-CM | POA: Diagnosis not present

## 2018-09-03 ENCOUNTER — Encounter: Payer: 59 | Admitting: Family Medicine

## 2018-09-07 ENCOUNTER — Other Ambulatory Visit: Payer: Self-pay | Admitting: Family Medicine

## 2018-09-09 ENCOUNTER — Other Ambulatory Visit: Payer: Self-pay | Admitting: Family Medicine

## 2018-09-11 DIAGNOSIS — Z7984 Long term (current) use of oral hypoglycemic drugs: Secondary | ICD-10-CM | POA: Diagnosis not present

## 2018-09-11 DIAGNOSIS — H5203 Hypermetropia, bilateral: Secondary | ICD-10-CM | POA: Diagnosis not present

## 2018-09-11 DIAGNOSIS — E119 Type 2 diabetes mellitus without complications: Secondary | ICD-10-CM | POA: Diagnosis not present

## 2018-09-11 LAB — HM DIABETES EYE EXAM

## 2018-10-04 ENCOUNTER — Encounter: Payer: 59 | Admitting: Family Medicine

## 2018-10-04 ENCOUNTER — Encounter: Payer: Self-pay | Admitting: *Deleted

## 2018-10-09 ENCOUNTER — Encounter: Payer: Self-pay | Admitting: Family Medicine

## 2018-10-09 ENCOUNTER — Ambulatory Visit: Payer: 59 | Admitting: Family Medicine

## 2018-10-09 VITALS — BP 120/82 | HR 57 | Temp 97.9°F | Resp 15 | Ht 76.0 in | Wt 257.4 lb

## 2018-10-09 DIAGNOSIS — J069 Acute upper respiratory infection, unspecified: Secondary | ICD-10-CM

## 2018-10-09 DIAGNOSIS — H9201 Otalgia, right ear: Secondary | ICD-10-CM | POA: Diagnosis not present

## 2018-10-09 MED ORDER — NEOMYCIN-POLYMYXIN-HC 3.5-10000-1 OT SOLN: 4.0000 [drp] | Freq: Four times a day (QID) | OTIC | 0 refills | Status: AC

## 2018-10-09 NOTE — Progress Notes (Signed)
Patient ID: Todd Ellis, male    DOB: [birth year], 63 y.o.   MRN: [id number]  PCP: Todd Frizzle, MD  Chief Complaint  Patient presents with  . Ear Pain    Patient in with c/o right ear. Patient states ear was swollen and severely painful a few days ago.    Subjective:   Todd Ellis is a 63 y.o. male, presents to clinic with CC of  Right ear pain with popping and clicking occurring when swallowing, onset gradual about 3-4 days ago.  He had swelling behind and around right ear and pain radiating from right ear to throat, temple jaw and neck.  He rinsed his ear out with peroxide - there was no pain with that, and his ear pain and swelling improved a little bit.  He denies any swelling, redness or drainage to outer ear, no pain in ear canal.  No change to hearing.  No fever.  He usually sets some tissue in opening of both ears when he's getting out of the shower - to absorb water, but he never uses q-tips in ears.  No hx of ear infections or ENT procedures.    Patient Active Problem List   Diagnosis Date Noted  . OSA on CPAP 10/13/2016  . Paroxysmal atrial fibrillation ([place]) 07/07/2016  . Fatigue due to excessive exertion 07/07/2016  . Snoring 07/07/2016  . Atrial flutter ([place])   . Hyperlipidemia   . Hypertension   . Diabetes mellitus without complication ([place])      Prior to Admission medications   Medication Sig Start Date End Date Taking? Authorizing Provider  amLODipine (NORVASC) 10 MG tablet Take 10 mg by mouth daily. 01/20/17  Yes [provider]  CINNAMON PO Take 1,000 mg by mouth 2 (two) times daily.    Yes [provider]  Cyanocobalamin (VITAMIN B 12 PO) Take 1,000 mg by mouth daily.   Yes [provider]  fluticasone (FLONASE) 50 MCG/ACT nasal spray Place 2 sprays into both nostrils daily.   Yes [provider]  INVOKANA 300 MG TABS tablet TAKE 1 TABLET BY MOUTH EVERY DAY BEFORE BREAKFAST 09/10/18  Yes Todd Frizzle,  MD  lisinopril (PRINIVIL,ZESTRIL) 40 MG tablet Take 40 mg by mouth daily. 01/13/17  Yes [provider]  metFORMIN (GLUCOPHAGE) 1000 MG tablet TAKE 1 TABLET BY MOUTH TWICE A DAY 09/07/18  Yes Todd Frizzle, MD  metoprolol succinate (TOPROL-XL) 50 MG 24 hr tablet TAKE 1 TABLET BY MOUTH EVERY DAY WITH OR IMMEDIATELY FOLLOWING A MEAL 06/14/18  Yes Todd Frizzle, MD  naproxen (NAPROSYN) 500 MG tablet Take 1 tablet (500 mg total) by mouth 2 (two) times daily with a meal. 07/25/18  Yes Delsa Grana, PA-C  Omega-3 Fatty Acids (FISH OIL) 1200 MG CAPS Take 3 capsules by mouth daily.   Yes [provider]  rosuvastatin (CRESTOR) 40 MG tablet Take 0.5 tablets (20 mg total) by mouth daily. 06/14/18  Yes Todd Frizzle, MD  sitaGLIPtin (JANUVIA) 100 MG tablet TAKE 1 TABLET BY MOUTH EVERY DAY 06/14/18  Yes Todd Frizzle, MD  spironolactone (ALDACTONE) 25 MG tablet Take 25 mg by mouth daily. 07/09/18  Yes [provider]  SYRINGE-NEEDLE, DISP, 3 ML (LUER LOCK SAFETY SYRINGES) 23G X 1" 3 ML MISC Use with testosterone injections 04/18/17  Yes Todd Frizzle, MD  testosterone cypionate (DEPOTESTOSTERONE CYPIONATE) 200 MG/ML injection Inject 1 mL (200 mg total) into the muscle every 14 (fourteen)  days. 04/18/17  Yes Todd Frizzle, MD  tiZANidine (ZANAFLEX) 4 MG tablet Take 1 tablet (4 mg total) by mouth every 6 (six) hours as needed for muscle spasms. 07/25/18  Yes Delsa Grana, PA-C  vitamin C (ASCORBIC ACID) 500 MG tablet Take 500 mg by mouth daily.   Yes [provider]     Allergies  Allergen Reactions  . Lipitor [Atorvastatin]     Myalgias  . Penicillin G Rash  . Penicillins Rash     No family history on file.   Social History   Socioeconomic History  . Marital status: Married    Spouse name: Not on file  . Number of children: Not on file  . Years of education: Not on file  . Highest education level: Not on file  Occupational History  . Not on  file  Social Needs  . Financial resource strain: Not on file  . Food insecurity:    Worry: Not on file    Inability: Not on file  . Transportation needs:    Medical: Not on file    Non-medical: Not on file  Tobacco Use  . Smoking status: Never Smoker  . Smokeless tobacco: Former Systems developer    Types: Chew  . Tobacco comment: quit 2002  Substance and Sexual Activity  . Alcohol use: Yes    Comment: Occasional  . Drug use: No  . Sexual activity: Not on file  Lifestyle  . Physical activity:    Days per week: Not on file    Minutes per session: Not on file  . Stress: Not on file  Relationships  . Social connections:    Talks on phone: Not on file    Gets together: Not on file    Attends religious service: Not on file    Active member of club or organization: Not on file    Attends meetings of clubs or organizations: Not on file    Relationship status: Not on file  . Intimate partner violence:    Fear of current or ex partner: Not on file    Emotionally abused: Not on file    Physically abused: Not on file    Forced sexual activity: Not on file  Other Topics Concern  . Not on file  Social History Narrative  . Not on file     Review of Systems  Constitutional: Negative.  Negative for activity change, appetite change, chills, diaphoresis, fatigue and fever.  HENT: Positive for congestion, ear pain, postnasal drip, rhinorrhea and sore throat. Negative for ear discharge, sinus pressure, sinus pain, sneezing, tinnitus and trouble swallowing.   Eyes: Negative.   Respiratory: Negative.  Negative for cough and wheezing.   Cardiovascular: Negative.   Gastrointestinal: Negative.  Negative for abdominal pain, diarrhea, nausea and vomiting.  Endocrine: Negative.   Genitourinary: Negative.   Musculoskeletal: Negative.   Skin: Negative.  Negative for color change and rash.  Allergic/Immunologic: Negative.   Neurological: Negative.   Hematological: Negative.   Psychiatric/Behavioral:  Negative.   All other systems reviewed and are negative.      Objective:    Vitals:   10/09/18 0933  BP: 120/82  Pulse: (!) 57  Resp: 15  Temp: 97.9 F (36.6 C)  TempSrc: Oral  SpO2: 97%  Weight: 257 lb 6 oz (116.7 kg)  Height: 6\' 4"  (1.93 m)      Physical Exam Vitals signs and nursing note reviewed.  Constitutional:      General: He is not  in acute distress.    Appearance: Normal appearance. He is well-developed. He is not toxic-appearing or diaphoretic.  HENT:     Head: Normocephalic and atraumatic.     Jaw: No trismus or tenderness.     Salivary Glands: Right salivary gland is not diffusely enlarged or tender. Left salivary gland is not diffusely enlarged.     Right Ear: Hearing, ear canal and external ear normal. No decreased hearing noted. Tenderness present. No drainage or swelling. There is impacted cerumen (about 60% obstructing dry yellow cerumen - removed with curette and forcepts with direct visualization). No mastoid tenderness. No hemotympanum. Tympanic membrane is scarred (? spotted white scattered opacity to TM, obscured landmarks, no effusion/bubbles visualized no erythema, no perforation). Tympanic membrane is not injected, perforated or erythematous.     Left Ear: Hearing, tympanic membrane, ear canal and external ear normal. No decreased hearing noted.     Nose: Mucosal edema, congestion and rhinorrhea present.     Right Sinus: No maxillary sinus tenderness or frontal sinus tenderness.     Left Sinus: No maxillary sinus tenderness or frontal sinus tenderness.     Mouth/Throat:     Mouth: Mucous membranes are moist. Mucous membranes are not pale, not dry and not cyanotic.     Pharynx: Uvula midline. Posterior oropharyngeal erythema present. No oropharyngeal exudate or uvula swelling.     Tonsils: No tonsillar exudate or tonsillar abscesses.  Eyes:     General: Lids are normal.        Right eye: No discharge.        Left eye: No discharge.      Conjunctiva/sclera: Conjunctivae normal.     Pupils: Pupils are equal, round, and reactive to light.  Neck:     Musculoskeletal: Normal range of motion and neck supple.     Trachea: Trachea and phonation normal. No tracheal deviation.  Cardiovascular:     Rate and Rhythm: Normal rate and regular rhythm.     Pulses:          Radial pulses are 2+ on the right side and 2+ on the left side.     Heart sounds: Normal heart sounds. No murmur. No friction rub. No gallop.   Pulmonary:     Effort: Pulmonary effort is normal. No tachypnea, accessory muscle usage or respiratory distress.     Breath sounds: Normal breath sounds. No stridor. No decreased breath sounds, wheezing, rhonchi or rales.  Abdominal:     General: Bowel sounds are normal. There is no distension.     Palpations: Abdomen is soft.     Tenderness: There is no abdominal tenderness.  Musculoskeletal: Normal range of motion.  Lymphadenopathy:     Head:     Right side of head: No submental, submandibular, tonsillar, preauricular, posterior auricular or occipital adenopathy.     Left side of head: No submental, submandibular, tonsillar, preauricular, posterior auricular or occipital adenopathy.     Cervical: No cervical adenopathy.  Skin:    General: Skin is warm and dry.     Capillary Refill: Capillary refill takes less than 2 seconds.     Coloration: Skin is not pale.     Findings: No rash.     Nails: There is no clubbing.   Neurological:     Mental Status: He is alert and oriented to person, place, and time.     Motor: No abnormal muscle tone.     Coordination: Coordination normal.     Gait: Gait  normal.  Psychiatric:        Speech: Speech normal.        Behavior: Behavior normal. Behavior is cooperative.           Assessment & Plan:   Pt with URI, suspect viral illness  He has ear pain, I do not note any swelling to external ear, its red but he's been poking it Ear canal is mildly tender, removed a little cerumen  with direct visualization curette and small tweezers TM is abnormal appearing, scarring vs effusion vs cholesteoma?  Will try cortisporin drops - TM is intact.  Suspect most of pain is referred from URI vs eustachian tube dysfunction, pt to do daily steroid nasal spray, start an antihistamine pt to follow up for recheck in 2 weeks.     ICD-10-CM   1. Upper respiratory tract infection, unspecified type J06.9   2. Acute otalgia, right H92.01        Delsa Grana, PA-C 10/09/18 10:06 AM

## 2018-10-09 NOTE — Patient Instructions (Addendum)
I think your ear pain is likely from eustachian tube dysfunction and/or referred pain from nose and throat   Continue to use your steroid nasal spray and start a daily antihistamine - like over the counter zyrtec, claritin, allegra or over the counter equivalent.  Just do the steroid nasal spray 2 sprays each nostril daily for the next 2 weeks and take the antihistamine 10 mg at night daily for the next 2 weeks and follow-up.    Eustachian Tube Dysfunction  Eustachian tube dysfunction refers to a condition in which a blockage develops in the narrow passage that connects the middle ear to the back of the nose (eustachian tube). The eustachian tube regulates air pressure in the middle ear by letting air move between the ear and nose. It also helps to drain fluid from the middle ear space. Eustachian tube dysfunction can affect one or both ears. When the eustachian tube does not function properly, air pressure, fluid, or both can build up in the middle ear. What are the causes? This condition occurs when the eustachian tube becomes blocked or cannot open normally. Common causes of this condition include:  Ear infections.  Colds and other infections that affect the nose, mouth, and throat (upper respiratory tract).  Allergies.  Irritation from cigarette smoke.  Irritation from stomach acid coming up into the esophagus (gastroesophageal reflux). The esophagus is the tube that carries food from the mouth to the stomach.  Sudden changes in air pressure, such as from descending in an airplane or scuba diving.  Abnormal growths in the nose or throat, such as: ? Growths that line the nose (nasal polyps). ? Abnormal growth of cells (tumors). ? Enlarged tissue at the back of the throat (adenoids). What increases the risk? You are more likely to develop this condition if:  You smoke.  You are overweight.  You are a child who has: ? Certain birth defects of the mouth, such as cleft  palate. ? Large tonsils or adenoids. What are the signs or symptoms? Common symptoms of this condition include:  A feeling of fullness in the ear.  Ear pain.  Clicking or popping noises in the ear.  Ringing in the ear.  Hearing loss.  Loss of balance.  Dizziness. Symptoms may get worse when the air pressure around you changes, such as when you travel to an area of high elevation, fly on an airplane, or go scuba diving. How is this diagnosed? This condition may be diagnosed based on:  Your symptoms.  A physical exam of your ears, nose, and throat.  Tests, such as those that measure: ? The movement of your eardrum (tympanogram). ? Your hearing (audiometry). How is this treated? Treatment depends on the cause and severity of your condition.  In mild cases, you may relieve your symptoms by moving air into your ears. This is called "popping the ears."  In more severe cases, or if you have symptoms of fluid in your ears, treatment may include: ? Medicines to relieve congestion (decongestants). ? Medicines that treat allergies (antihistamines). ? Nasal sprays or ear drops that contain medicines that reduce swelling (steroids). ? A procedure to drain the fluid in your eardrum (myringotomy). In this procedure, a small tube is placed in the eardrum to:  Drain the fluid.  Restore the air in the middle ear space. ? A procedure to insert a balloon device through the nose to inflate the opening of the eustachian tube (balloon dilation). Follow these instructions at home: Lifestyle  Do not do any of the following until your health care provider approves: ? Travel to high altitudes. ? Fly in airplanes. ? Work in a Pension scheme manager or room. ? Scuba dive.  Do not use any products that contain nicotine or tobacco, such as cigarettes and e-cigarettes. If you need help quitting, ask your health care provider.  Keep your ears dry. Wear fitted earplugs during showering and bathing.  Dry your ears completely after. General instructions  Take over-the-counter and prescription medicines only as told by your health care provider.  Use techniques to help pop your ears as recommended by your health care provider. These may include: ? Chewing gum. ? Yawning. ? Frequent, forceful swallowing. ? Closing your mouth, holding your nose closed, and gently blowing as if you are trying to blow air out of your nose.  Keep all follow-up visits as told by your health care provider. This is important. Contact a health care provider if:  Your symptoms do not go away after treatment.  Your symptoms come back after treatment.  You are unable to pop your ears.  You have: ? A fever. ? Pain in your ear. ? Pain in your head or neck. ? Fluid draining from your ear.  Your hearing suddenly changes.  You become very dizzy.  You lose your balance. Summary  Eustachian tube dysfunction refers to a condition in which a blockage develops in the eustachian tube.  It can be caused by ear infections, allergies, inhaled irritants, or abnormal growths in the nose or throat.  Symptoms include ear pain, hearing loss, or ringing in the ears.  Mild cases are treated with maneuvers to unblock the ears, such as yawning or ear popping.  Severe cases are treated with medicines. Surgery may also be done (rare). This information is not intended to replace advice given to you by your health care provider. Make sure you discuss any questions you have with your health care provider. Document Released: 10/16/2015 Document Revised: 01/09/2018 Document Reviewed: 01/09/2018 Elsevier Interactive Patient Education  2019 [place], Adult An earache, or ear pain, can be caused by many things, including:  An infection.  Ear wax buildup.  Ear pressure.  Something in the ear that should not be there (foreign body).  A sore throat.  Tooth problems.  Jaw problems. Treatment of the  earache will depend on the cause. If the cause is not clear or cannot be determined, you may need to watch your symptoms until your earache goes away or until a cause is found. Follow these instructions at home: Pay attention to any changes in your symptoms. Take these actions to help with your pain:  Take or apply over-the-counter and prescription medicines only as told by your health care provider.  If you were prescribed an antibiotic medicine, use it as told by your health care provider. Do not stop using the antibiotic even if you start to feel better.  Do not put anything in your ear other than medicine that is prescribed by your health care provider.  If directed, apply heat to the affected area as often as told by your health care provider. Use the heat source that your health care provider recommends, such as a moist heat pack or a heating pad. ? Place a towel between your skin and the heat source. ? Leave the heat on for 20-30 minutes. ? Remove the heat if your skin turns bright red. This is especially important if you are unable  to feel pain, heat, or cold. You may have a greater risk of getting burned.  If directed, put ice on the ear: ? Put ice in a plastic bag. ? Place a towel between your skin and the bag. ? Leave the ice on for 20 minutes, 2-3 times a day.  Try resting in an upright position instead of lying down. This may help to reduce pressure in your ear and relieve pain.  Chew gum if it helps to relieve your ear pain.  Treat any allergies as told by your health care provider.  Keep all follow-up visits as told by your health care provider. This is important. Contact a health care provider if:  Your pain does not improve within 2 days.  Your earache gets worse.  You have new symptoms.  You have a fever. Get help right away if:  You have a severe headache.  You have a stiff neck.  You have trouble swallowing.  You have redness or swelling behind your  ear.  You have fluid or blood coming from your ear.  You have hearing loss.  You feel dizzy. This information is not intended to replace advice given to you by your health care provider. Make sure you discuss any questions you have with your health care provider. Document Released: 05/06/2004 Document Revised: 05/17/2016 Document Reviewed: 03/14/2016 Elsevier Interactive Patient Education  [employer].

## 2018-10-11 ENCOUNTER — Other Ambulatory Visit: Payer: 59

## 2018-10-11 ENCOUNTER — Encounter: Payer: Self-pay | Admitting: Family Medicine

## 2018-10-17 ENCOUNTER — Encounter: Payer: Self-pay | Admitting: Family Medicine

## 2018-10-22 ENCOUNTER — Other Ambulatory Visit: Payer: 59

## 2018-10-25 ENCOUNTER — Encounter: Payer: 59 | Admitting: Family Medicine

## 2018-11-12 ENCOUNTER — Telehealth: Payer: Self-pay | Admitting: Family Medicine

## 2018-11-12 ENCOUNTER — Other Ambulatory Visit: Payer: 59

## 2018-11-12 DIAGNOSIS — E7800 Pure hypercholesterolemia, unspecified: Secondary | ICD-10-CM

## 2018-11-12 DIAGNOSIS — E119 Type 2 diabetes mellitus without complications: Secondary | ICD-10-CM

## 2018-11-12 DIAGNOSIS — I10 Essential (primary) hypertension: Secondary | ICD-10-CM | POA: Diagnosis not present

## 2018-11-12 MED ORDER — AMLODIPINE BESYLATE 10 MG PO TABS: 10.0000 mg | ORAL_TABLET | Freq: Every day | ORAL | 1 refills | Status: DC

## 2018-11-12 NOTE — Telephone Encounter (Signed)
Refill on amlodipine to [address]

## 2018-11-12 NOTE — Telephone Encounter (Signed)
Medication called/sent to requested pharmacy  

## 2018-11-15 ENCOUNTER — Encounter: Payer: Self-pay | Admitting: Family Medicine

## 2018-11-15 ENCOUNTER — Ambulatory Visit (INDEPENDENT_AMBULATORY_CARE_PROVIDER_SITE_OTHER): Payer: 59 | Admitting: Family Medicine

## 2018-11-15 VITALS — BP 100/62 | HR 66 | Temp 97.5°F | Resp 16 | Ht 76.0 in | Wt 246.0 lb

## 2018-11-15 DIAGNOSIS — L989 Disorder of the skin and subcutaneous tissue, unspecified: Secondary | ICD-10-CM

## 2018-11-15 DIAGNOSIS — Z0000 Encounter for general adult medical examination without abnormal findings: Secondary | ICD-10-CM

## 2018-11-15 DIAGNOSIS — E7800 Pure hypercholesterolemia, unspecified: Secondary | ICD-10-CM

## 2018-11-15 DIAGNOSIS — Z125 Encounter for screening for malignant neoplasm of prostate: Secondary | ICD-10-CM

## 2018-11-15 DIAGNOSIS — I10 Essential (primary) hypertension: Secondary | ICD-10-CM

## 2018-11-15 DIAGNOSIS — Z1211 Encounter for screening for malignant neoplasm of colon: Secondary | ICD-10-CM

## 2018-11-15 DIAGNOSIS — I480 Paroxysmal atrial fibrillation: Secondary | ICD-10-CM

## 2018-11-15 DIAGNOSIS — E119 Type 2 diabetes mellitus without complications: Secondary | ICD-10-CM

## 2018-11-15 MED ORDER — DAPAGLIFLOZIN PROPANEDIOL 10 MG PO TABS: 10.0000 mg | ORAL_TABLET | Freq: Every day | ORAL | 3 refills | Status: DC

## 2018-11-15 NOTE — Progress Notes (Signed)
Subjective:    Patient ID: Todd Ellis, male    DOB: [birth year], 63 y.o.   MRN: [id number]  Medication Refill   Here for CPE.  Last colonoscopy was in 2009.  He is due for that again now.  Patient has a history of an ejection fraction of 45% on echocardiogram in 2017.  Repeat echocardiogram after cardioversion in 2018 revealed ejection fraction of 59%.  However the patient is at increased risk for congestive heart failure and therefore may benefit from switching invokana to [place].  He is due for prostate cancer screening.  His immunization records are listed below: Immunization History  Administered Date(s) Administered  . Influenza,inj,Quad PF,6+ Mos 07/20/2017, 06/14/2018  . Pneumococcal Polysaccharide-23 04/16/2018  . Td 02/03/2006  . Tdap 02/23/2016   He is due for Shingrix.  He will be due for Prevnar 13 at age 32. Lab on 11/12/2018  Component Date Value Ref Range Status  . Glucose, Bld 11/12/2018 106* 65 - 99 mg/dL Final   Comment: .            Fasting reference interval . For someone without known diabetes, a glucose value between 100 and 125 mg/dL is consistent with prediabetes and should be confirmed with a follow-up test. .   . BUN 11/12/2018 16  7 - 25 mg/dL Final  . Creat 11/12/2018 0.86  0.70 - 1.25 mg/dL Final   Comment: For patients >60 years of age, the reference limit for Creatinine is approximately 13% higher for people identified as African-American. .   Havery Moros Ratio 62/22/9798 NOT APPLICABLE  6 - 22 (calc) Final  . Sodium 11/12/2018 139  135 - 146 mmol/L Final  . Potassium 11/12/2018 4.6  3.5 - 5.3 mmol/L Final  . Chloride 11/12/2018 104  98 - 110 mmol/L Final  . CO2 11/12/2018 24  20 - 32 mmol/L Final  . Calcium 11/12/2018 9.6  8.6 - 10.3 mg/dL Final  . Total Protein 11/12/2018 6.6  6.1 - 8.1 g/dL Final  . Albumin 11/12/2018 4.4  3.6 - 5.1 g/dL Final  . Globulin 11/12/2018 2.2  1.9 - 3.7 g/dL (calc) Final  . AG Ratio 11/12/2018 2.0   1.0 - 2.5 (calc) Final  . Total Bilirubin 11/12/2018 0.7  0.2 - 1.2 mg/dL Final  . Alkaline phosphatase (APISO) 11/12/2018 45  35 - 144 U/L Final  . AST 11/12/2018 15  10 - 35 U/L Final  . ALT 11/12/2018 26  9 - 46 U/L Final  . Hgb A1c MFr Bld 11/12/2018 6.9* <5.7 % of total Hgb Final   Comment: For someone without known diabetes, a hemoglobin A1c value of 6.5% or greater indicates that they may have  diabetes and this should be confirmed with a follow-up  test. . For someone with known diabetes, a value <7% indicates  that their diabetes is well controlled and a value  greater than or equal to 7% indicates suboptimal  control. A1c targets should be individualized based on  duration of diabetes, age, comorbid conditions, and  other considerations. . Currently, no consensus exists regarding use of hemoglobin A1c for diagnosis of diabetes for children. .   . Mean Plasma Glucose 11/12/2018 151  (calc) Final  . eAG (mmol/L) 11/12/2018 8.4  (calc) Final  . WBC 11/12/2018 6.1  3.8 - 10.8 Thousand/uL Final  . RBC 11/12/2018 5.26  4.20 - 5.80 Million/uL Final  . Hemoglobin 11/12/2018 15.8  13.2 - 17.1 g/dL Final  . HCT 11/12/2018 46.2  38.5 - 50.0 % Final  . MCV 11/12/2018 87.8  80.0 - 100.0 fL Final  . MCH 11/12/2018 30.0  27.0 - 33.0 pg Final  . MCHC 11/12/2018 34.2  32.0 - 36.0 g/dL Final  . RDW 11/12/2018 11.7  11.0 - 15.0 % Final  . Platelets 11/12/2018 211  140 - 400 Thousand/uL Final  . MPV 11/12/2018 11.2  7.5 - 12.5 fL Final  . Neutro Abs 11/12/2018 3,922  1,500 - 7,800 cells/uL Final  . Lymphs Abs 11/12/2018 1,513  850 - 3,900 cells/uL Final  . Absolute Monocytes 11/12/2018 573  200 - 950 cells/uL Final  . Eosinophils Absolute 11/12/2018 61  15 - 500 cells/uL Final  . Basophils Absolute 11/12/2018 31  0 - 200 cells/uL Final  . Neutrophils Relative % 11/12/2018 64.3  % Final  . Total Lymphocyte 11/12/2018 24.8  % Final  . Monocytes Relative 11/12/2018 9.4  % Final  .  Eosinophils Relative 11/12/2018 1.0  % Final  . Basophils Relative 11/12/2018 0.5  % Final  . Cholesterol 11/12/2018 119  <200 mg/dL Final  . HDL 11/12/2018 28* > OR = 40 mg/dL Final  . Triglycerides 11/12/2018 97  <150 mg/dL Final  . LDL Cholesterol (Calc) 11/12/2018 73  mg/dL (calc) Final   Comment: Reference range: <100 . Desirable range <100 mg/dL for primary prevention;   <70 mg/dL for patients with CHD or diabetic patients  with > or = 2 CHD risk factors. Marland Kitchen LDL-C is now calculated using the Martin-Hopkins  calculation, which is a validated novel method providing  better accuracy than the Friedewald equation in the  estimation of LDL-C.  Cresenciano Genre et al. Annamaria Helling. 5366;440(34): 2061-2068  (http://education.[facility].com/faq/FAQ164)   . Total CHOL/HDL Ratio 11/12/2018 4.3  <5.0 (calc) Final  . Non-HDL Cholesterol (Calc) 11/12/2018 91  <130 mg/dL (calc) Final   Comment: For patients with diabetes plus 1 major ASCVD risk  factor, treating to a non-HDL-C goal of <100 mg/dL  (LDL-C of <70 mg/dL) is considered a therapeutic  option.      Past Medical History:  Diagnosis Date  . Atrial flutter ([place])   . Diabetes mellitus without complication ([place])   . Hyperlipidemia   . Hypertension    Past Surgical History:  Procedure Laterality Date  . CARDIOVERSION N/A 07/26/2016   Procedure: CARDIOVERSION;  Surgeon: Adrian Prows, MD;  Location: [facility] ENDOSCOPY;  Service: Cardiovascular;  Laterality: N/A;   Current Outpatient Medications on File Prior to Visit  Medication Sig Dispense Refill  . amLODipine (NORVASC) 10 MG tablet Take 1 tablet (10 mg total) by mouth daily. 90 tablet 1  . CINNAMON PO Take 1,000 mg by mouth 2 (two) times daily.     . Cyanocobalamin (VITAMIN B 12 PO) Take 1,000 mg by mouth daily.    . fluticasone (FLONASE) 50 MCG/ACT nasal spray Place 2 sprays into both nostrils daily.    . INVOKANA 300 MG TABS tablet TAKE 1 TABLET BY MOUTH EVERY DAY BEFORE BREAKFAST 90 tablet  1  . lisinopril (PRINIVIL,ZESTRIL) 40 MG tablet Take 40 mg by mouth daily.  4  . metFORMIN (GLUCOPHAGE) 1000 MG tablet TAKE 1 TABLET BY MOUTH TWICE A DAY 180 tablet 1  . metoprolol succinate (TOPROL-XL) 50 MG 24 hr tablet TAKE 1 TABLET BY MOUTH EVERY DAY WITH OR IMMEDIATELY FOLLOWING A MEAL 90 tablet 1  . naproxen (NAPROSYN) 500 MG tablet Take 1 tablet (500 mg total) by mouth 2 (two) times daily with a meal. 30 tablet  0  . Omega-3 Fatty Acids (FISH OIL) 1200 MG CAPS Take 3 capsules by mouth daily.    . rosuvastatin (CRESTOR) 40 MG tablet Take 0.5 tablets (20 mg total) by mouth daily. 45 tablet 1  . sitaGLIPtin (JANUVIA) 100 MG tablet TAKE 1 TABLET BY MOUTH EVERY DAY 90 tablet 1  . spironolactone (ALDACTONE) 25 MG tablet Take 25 mg by mouth daily.  6  . SYRINGE-NEEDLE, DISP, 3 ML (LUER LOCK SAFETY SYRINGES) 23G X 1" 3 ML MISC Use with testosterone injections 50 each 3  . testosterone cypionate (DEPOTESTOSTERONE CYPIONATE) 200 MG/ML injection Inject 1 mL (200 mg total) into the muscle every 14 (fourteen) days. 10 mL 0  . tiZANidine (ZANAFLEX) 4 MG tablet Take 1 tablet (4 mg total) by mouth every 6 (six) hours as needed for muscle spasms. 30 tablet 0  . vitamin C (ASCORBIC ACID) 500 MG tablet Take 500 mg by mouth daily.     Current Facility-Administered Medications on File Prior to Visit  Medication Dose Route Frequency Provider Last Rate Last Dose  . testosterone cypionate (DEPOTESTOSTERONE CYPIONATE) injection 200 mg  200 mg Intramuscular Q14 Days Susy Frizzle, MD   200 mg at 07/20/17 1647    Allergies  Allergen Reactions  . Lipitor [Atorvastatin]     Myalgias  . Penicillin G Rash  . Penicillins Rash   Social History   Socioeconomic History  . Marital status: Married    Spouse name: Not on file  . Number of children: Not on file  . Years of education: Not on file  . Highest education level: Not on file  Occupational History  . Not on file  Social Needs  . Financial resource  strain: Not on file  . Food insecurity:    Worry: Not on file    Inability: Not on file  . Transportation needs:    Medical: Not on file    Non-medical: Not on file  Tobacco Use  . Smoking status: Never Smoker  . Smokeless tobacco: Former Systems developer    Types: Chew  . Tobacco comment: quit 2002  Substance and Sexual Activity  . Alcohol use: Yes    Comment: Occasional  . Drug use: No  . Sexual activity: Not on file  Lifestyle  . Physical activity:    Days per week: Not on file    Minutes per session: Not on file  . Stress: Not on file  Relationships  . Social connections:    Talks on phone: Not on file    Gets together: Not on file    Attends religious service: Not on file    Active member of club or organization: Not on file    Attends meetings of clubs or organizations: Not on file    Relationship status: Not on file  . Intimate partner violence:    Fear of current or ex partner: Not on file    Emotionally abused: Not on file    Physically abused: Not on file    Forced sexual activity: Not on file  Other Topics Concern  . Not on file  Social History Narrative  . Not on file      Review of Systems  All other systems reviewed and are negative.      Objective:   Physical Exam  Constitutional: He is oriented to person, place, and time. He appears well-developed and well-nourished. No distress.  HENT:  Head: Normocephalic and atraumatic.    Right Ear: External ear normal.  Left Ear: External ear normal.  Nose: Nose normal.  Mouth/Throat: Oropharynx is clear and moist. No oropharyngeal exudate.  Eyes: Pupils are equal, round, and reactive to light. Conjunctivae and EOM are normal. Right eye exhibits no discharge. Left eye exhibits no discharge. No scleral icterus.  Neck: Normal range of motion. Neck supple. No JVD present. No tracheal deviation present. No thyromegaly present.  Cardiovascular: Normal rate, regular rhythm and intact distal pulses. Exam reveals no  gallop and no friction rub.  Murmur heard. Pulmonary/Chest: Effort normal and breath sounds normal. No stridor. No respiratory distress. He has no wheezes. He has no rales.  Abdominal: Soft. Bowel sounds are normal. He exhibits no distension and no mass. There is no abdominal tenderness. There is no rebound and no guarding.  Musculoskeletal:        General: No edema.  Lymphadenopathy:    He has no cervical adenopathy.  Neurological: He is alert and oriented to person, place, and time. He has normal reflexes. He displays normal reflexes. No cranial nerve deficit. He exhibits normal muscle tone. Coordination normal.  Skin: Skin is warm. Rash noted. He is not diaphoretic.  Psychiatric: He has a normal mood and affect. His behavior is normal. Judgment and thought content normal.  Vitals reviewed.       Assessment & Plan:  General medical exam  Controlled type 2 diabetes mellitus without complication, without long-term current use of insulin (HCC)  Benign essential HTN  Pure hypercholesterolemia  Paroxysmal atrial fibrillation (HCC)  Prostate cancer screening - Plan: PSA  Colon cancer screening - Plan: Ambulatory referral to Gastroenterology  Skin abnormality - Plan: Ambulatory referral to Dermatology  Patient's physical exam today is completely normal.  I am very proud of the patient.  He is quit drinking.  However he is not exercising regularly.  His HDL cholesterol is low at 28.  I encouraged the patient to try to get 30 minutes a day 5 days a week of aerobic exercise.  I like to see him lose another 10 to 15 pounds however he is lost 10 pounds since his last visit and I am proud of the efforts he is making stop drinking alcohol.  Immunizations are up-to-date except for the shingles vaccine.  I encouraged the patient to get Shingrix.  I recommended discontinuing Invokana and replacing with farxiga 10 mg a day due to the results of the DECLARE study which showed decreased risk of  hospitalizations for those at risk for congestive heart failure.  Will discontinue Invokana.  We will add a PSA to his lab work to screen for prostate cancer.  I will schedule the patient to meet with a gastroenterologist to conduct his colonoscopy.  His blood pressure is excellent.  LDL cholesterol is at goal.  Hemoglobin A1c is acceptable at 6.9.  The patient does have a small hyperkeratotic actinic keratosis in front of his right ear that is tender and painful.  I have recommended cryotherapy for this.  Patient would like to see a dermatologist to do this and also for an annual skin exam.  Patient does have some atypical moles in the center of his back.  I do not believe there melanoma but I do believe they would benefit from a dermatologist evaluation.  Therefore I will consult dermatology.

## 2018-11-16 ENCOUNTER — Encounter: Payer: Self-pay | Admitting: Gastroenterology

## 2018-11-17 LAB — LIPID PANEL
Cholesterol: 119 mg/dL (ref <200)
HDL: 28 mg/dL — AB (ref >=40)
LDL Cholesterol (Calc): 73 mg/dL (calc)
Non-HDL Cholesterol (Calc): 91 mg/dL (calc) (ref <130)
TRIGLYCERIDES: 97 mg/dL (ref <150)
Total CHOL/HDL Ratio: 4.3 (calc) (ref <5.0)

## 2018-11-17 LAB — TEST AUTHORIZATION

## 2018-11-17 LAB — COMPREHENSIVE METABOLIC PANEL
AG Ratio: 2 (calc) (ref 1.0–2.5)
ALKALINE PHOSPHATASE (APISO): 45 U/L (ref 35–144)
ALT: 26 U/L (ref 9–46)
AST: 15 U/L (ref 10–35)
Albumin: 4.4 g/dL (ref 3.6–5.1)
BILIRUBIN TOTAL: 0.7 mg/dL (ref 0.2–1.2)
BUN: 16 mg/dL (ref 7–25)
CALCIUM: 9.6 mg/dL (ref 8.6–10.3)
CO2: 24 mmol/L (ref 20–32)
Chloride: 104 mmol/L (ref 98–110)
Creat: 0.86 mg/dL (ref 0.70–1.25)
Globulin: 2.2 g/dL (calc) (ref 1.9–3.7)
Glucose, Bld: 106 mg/dL — ABNORMAL HIGH (ref 65–99)
Potassium: 4.6 mmol/L (ref 3.5–5.3)
Sodium: 139 mmol/L (ref 135–146)
TOTAL PROTEIN: 6.6 g/dL (ref 6.1–8.1)

## 2018-11-17 LAB — CBC WITH DIFFERENTIAL/PLATELET
Absolute Monocytes: 573 cells/uL (ref 200–950)
BASOS PCT: 0.5 %
Basophils Absolute: 31 cells/uL (ref 0–200)
Eosinophils Absolute: 61 cells/uL (ref 15–500)
Eosinophils Relative: 1 %
HCT: 46.2 % (ref 38.5–50.0)
Hemoglobin: 15.8 g/dL (ref 13.2–17.1)
Lymphs Abs: 1513 cells/uL (ref 850–3900)
MCH: 30 pg (ref 27.0–33.0)
MCHC: 34.2 g/dL (ref 32.0–36.0)
MCV: 87.8 fL (ref 80.0–100.0)
MPV: 11.2 fL (ref 7.5–12.5)
Monocytes Relative: 9.4 %
NEUTROS ABS: 3922 {cells}/uL (ref 1500–7800)
Neutrophils Relative %: 64.3 %
PLATELETS: 211 10*3/uL (ref 140–400)
RBC: 5.26 10*6/uL (ref 4.20–5.80)
RDW: 11.7 % (ref 11.0–15.0)
Total Lymphocyte: 24.8 %
WBC: 6.1 10*3/uL (ref 3.8–10.8)

## 2018-11-17 LAB — HEMOGLOBIN A1C
Hgb A1c MFr Bld: 6.9 % of total Hgb — ABNORMAL HIGH (ref <5.7)
Mean Plasma Glucose: 151 (calc)
eAG (mmol/L): 8.4 (calc)

## 2018-11-17 LAB — PSA: PSA: 0.8 ng/mL (ref <=4.0)

## 2018-11-28 ENCOUNTER — Ambulatory Visit (AMBULATORY_SURGERY_CENTER): Payer: Self-pay

## 2018-11-28 VITALS — Ht 76.0 in | Wt 250.8 lb

## 2018-11-28 DIAGNOSIS — Z86010 Personal history of colonic polyps: Secondary | ICD-10-CM

## 2018-11-28 MED ORDER — NA SULFATE-K SULFATE-MG SULF 17.5-3.13-1.6 GM/177ML PO SOLN: 1.0000 | Freq: Once | ORAL | 0 refills | Status: AC

## 2018-11-28 NOTE — Progress Notes (Signed)
No egg or soy allergy known to patient  No issues with past sedation with any surgeries  or procedures, no intubation problems  No diet pills per patient No home 02 use per patient  No blood thinners per patient  Pt denies issues with constipation  Hx  A fib x 1 EMMI video sent to pt's e mail , pt declined

## 2018-11-29 ENCOUNTER — Encounter: Payer: Self-pay | Admitting: Gastroenterology

## 2018-12-12 ENCOUNTER — Ambulatory Visit (AMBULATORY_SURGERY_CENTER): Payer: 59 | Admitting: Gastroenterology

## 2018-12-12 ENCOUNTER — Encounter: Payer: Self-pay | Admitting: Gastroenterology

## 2018-12-12 ENCOUNTER — Other Ambulatory Visit: Payer: Self-pay

## 2018-12-12 VITALS — BP 109/65 | HR 65 | Temp 98.6°F | Resp 17 | Ht 76.0 in | Wt 246.0 lb

## 2018-12-12 DIAGNOSIS — D125 Benign neoplasm of sigmoid colon: Secondary | ICD-10-CM | POA: Diagnosis not present

## 2018-12-12 DIAGNOSIS — D120 Benign neoplasm of cecum: Secondary | ICD-10-CM | POA: Diagnosis not present

## 2018-12-12 DIAGNOSIS — Z1211 Encounter for screening for malignant neoplasm of colon: Secondary | ICD-10-CM

## 2018-12-12 DIAGNOSIS — D122 Benign neoplasm of ascending colon: Secondary | ICD-10-CM

## 2018-12-12 DIAGNOSIS — K635 Polyp of colon: Secondary | ICD-10-CM

## 2018-12-12 MED ORDER — SODIUM CHLORIDE 0.9 % IV SOLN: 500.0000 mL | Freq: Once | INTRAVENOUS | Status: DC

## 2018-12-12 NOTE — Progress Notes (Signed)
Pt's states no medical or surgical changes since previsit or office visit. 

## 2018-12-12 NOTE — Patient Instructions (Addendum)
YOU HAD AN ENDOSCOPIC PROCEDURE TODAY AT [facility]:   Refer to the procedure report that was given to you for any specific questions about what was found during the examination.  If the procedure report does not answer your questions, please call your gastroenterologist to clarify.  If you requested that your care partner not be given the details of your procedure findings, then the procedure report has been included in a sealed envelope for you to review at your convenience later.  YOU SHOULD EXPECT: Some feelings of bloating in the abdomen. Passage of more gas than usual.  Walking can help get rid of the air that was put into your GI tract during the procedure and reduce the bloating. If you had a lower endoscopy (such as a colonoscopy or flexible sigmoidoscopy) you may notice spotting of blood in your stool or on the toilet paper. If you underwent a bowel prep for your procedure, you may not have a normal bowel movement for a few days.  Please Note:  You might notice some irritation and congestion in your nose or some drainage.  This is from the oxygen used during your procedure.  There is no need for concern and it should clear up in a day or so.  SYMPTOMS TO REPORT IMMEDIATELY:   Following lower endoscopy (colonoscopy or flexible sigmoidoscopy):  Excessive amounts of blood in the stool  Significant tenderness or worsening of abdominal pains  Swelling of the abdomen that is new, acute  Fever of 100F or higher  For urgent or emergent issues, a gastroenterologist can be reached at any hour by calling [phone].   DIET:  We do recommend a small meal at first, but then you may proceed to your regular diet.  Drink plenty of fluids but you should avoid alcoholic beverages for 24 hours.  ACTIVITY:  You should plan to take it easy for the rest of today and you should NOT DRIVE or use heavy machinery until tomorrow (because of the sedation medicines used during the test).     FOLLOW UP: Our staff will call the number listed on your records the next business day following your procedure to check on you and address any questions or concerns that you may have regarding the information given to you following your procedure. If we do not reach you, we will leave a message.  However, if you are feeling well and you are not experiencing any problems, there is no need to return our call.  We will assume that you have returned to your regular daily activities without incident.  If any biopsies were taken you will be contacted by phone or by letter within the next 1-3 weeks.  Please call us at [phone] if you have not heard about the biopsies in 3 weeks.   Await for biopsy results Polyps (handout given) Diverticulosis (handout given)  No ibuprofen, naproxen or other non-steroidal anti-inflammatory drugs for 2 weeks after polyp removal. Tylenol okay if needed.   SIGNATURES/CONFIDENTIALITY: You and/or your care partner have signed paperwork which will be entered into your electronic medical record.  These signatures attest to the fact that that the information above on your After Visit Summary has been reviewed and is understood.  Full responsibility of the confidentiality of this discharge information lies with you and/or your care-partner.

## 2018-12-12 NOTE — Progress Notes (Signed)
Called to room to assist during endoscopic procedure.  Patient ID and intended procedure confirmed with present staff. Received instructions for my participation in the procedure from the performing physician.  

## 2018-12-12 NOTE — Progress Notes (Signed)
Report to PACU, RN, vss, BBS= Clear.  

## 2018-12-12 NOTE — Op Note (Signed)
[facility] Patient Name: Todd Ellis Procedure Date: 12/12/2018 9:16 AM MRN: [id number] Endoscopist: Remo Lipps P. Havery Moros , MD Age: 63 Referring MD:  Date of Birth: December 12, 1955 Gender: Male Account #: [id number] Procedure:                Colonoscopy Indications:              Screening for colorectal malignant neoplasm Medicines:                Monitored Anesthesia Care Procedure:                Pre-Anesthesia Assessment:                           - Prior to the procedure, a History and Physical                            was performed, and patient medications and                            allergies were reviewed. The patient's tolerance of                            previous anesthesia was also reviewed. The risks                            and benefits of the procedure and the sedation                            options and risks were discussed with the patient.                            All questions were answered, and informed consent                            was obtained. Prior Anticoagulants: The patient has                            taken no previous anticoagulant or antiplatelet                            agents. ASA Grade Assessment: II - A patient with                            mild systemic disease. After reviewing the risks                            and benefits, the patient was deemed in                            satisfactory condition to undergo the procedure.                           After obtaining informed consent, the colonoscope  was passed under direct vision. Throughout the                            procedure, the patient's blood pressure, pulse, and                            oxygen saturations were monitored continuously. The                            Colonoscope was introduced through the anus and                            advanced to the the cecum, identified by                            appendiceal orifice  and ileocecal valve. The                            colonoscopy was performed without difficulty. The                            patient tolerated the procedure well. The quality                            of the bowel preparation was adequate. The                            ileocecal valve, appendiceal orifice, and rectum                            were photographed. Scope In: 9:22:10 AM Scope Out: 9:58:58 AM Scope Withdrawal Time: 0 hours 33 minutes 21 seconds  Total Procedure Duration: 0 hours 36 minutes 48 seconds  Findings:                 The perianal and digital rectal examinations were                            normal.                           Two sessile polyps were found in the cecum. The                            polyps were 3 to 4 mm in size. These polyps were                            removed with a cold snare. Resection and retrieval                            were complete.                           A 12 mm polyp was found in the ascending colon. The  polyp was flat. The polyp was removed with a cold                            snare. Resection and retrieval were complete.                           A 6 mm polyp was found in the ascending colon. The                            polyp was pedunculated. The polyp was removed with                            a hot snare. Resection and retrieval were complete.                           Two sessile polyps were found in the ascending                            colon. The polyps were 3 to 4 mm in size. These                            polyps were removed with a cold snare. Resection                            and retrieval were complete.                           Two sessile polyps were found in the sigmoid colon.                            The polyps were 3 mm in size. These polyps were                            removed with a cold snare. Resection and retrieval                            were  complete.                           Multiple medium-mouthed diverticula were found in                            the left colon.                           The prep was fair initially pre-lavage. Several                            minutes spent lavaging the colon in the right colon                            to achieve adequate views for screening purposes.  The exam was otherwise without abnormality. Complications:            No immediate complications. Estimated blood loss:                            Minimal. Estimated Blood Loss:     Estimated blood loss was minimal. Impression:               - Two 3 to 4 mm polyps in the cecum, removed with a                            cold snare. Resected and retrieved.                           - One 12 mm polyp in the ascending colon, removed                            with a cold snare. Resected and retrieved.                           - One 6 mm polyp in the ascending colon, removed                            with a hot snare. Resected and retrieved.                           - Two 3 to 4 mm polyps in the ascending colon,                            removed with a cold snare. Resected and retrieved.                           - Two 3 mm polyps in the sigmoid colon, removed                            with a cold snare. Resected and retrieved.                           - Diverticulosis in the left colon.                           - The examination was otherwise normal. Recommendation:           - Patient has a contact number available for                            emergencies. The signs and symptoms of potential                            delayed complications were discussed with the                            patient. Return to normal activities tomorrow.  Written discharge instructions were provided to the                            patient.                           - Resume previous diet.                            - Continue present medications.                           - Await pathology results.                           - No ibuprofen, naproxen, or other non-steroidal                            anti-inflammatory drugs for 2 weeks after polyp                            removal. Remo Lipps P. Cerinity Zynda, MD 12/12/2018 10:04:57 AM This report has been signed electronically.

## 2018-12-13 ENCOUNTER — Telehealth: Payer: Self-pay

## 2018-12-13 NOTE — Telephone Encounter (Signed)
Attempted to reach pt. With follow-up call following endoscopic procedure 12/12/2018.  Pt. Not available.  Unable to LM.

## 2018-12-14 ENCOUNTER — Encounter: Payer: Self-pay | Admitting: Gastroenterology

## 2018-12-19 ENCOUNTER — Other Ambulatory Visit: Payer: Self-pay | Admitting: Family Medicine

## 2018-12-19 ENCOUNTER — Telehealth: Payer: Self-pay | Admitting: Family Medicine

## 2018-12-19 ENCOUNTER — Other Ambulatory Visit: Payer: Self-pay | Admitting: Cardiology

## 2018-12-19 DIAGNOSIS — R000 Tachycardia, unspecified: Secondary | ICD-10-CM

## 2018-12-19 NOTE — Telephone Encounter (Signed)
Was sent to pharm today  - Receipt confirmed by pharmacy (12/19/2018 9:36 AM EDT)

## 2018-12-19 NOTE — Telephone Encounter (Signed)
Refill on metoprolol to [address].

## 2018-12-25 ENCOUNTER — Other Ambulatory Visit: Payer: Self-pay

## 2018-12-25 MED ORDER — LISINOPRIL 40 MG PO TABS: 40.0000 mg | ORAL_TABLET | Freq: Every day | ORAL | 0 refills | Status: DC

## 2018-12-25 MED ORDER — SPIRONOLACTONE 25 MG PO TABS: 25.0000 mg | ORAL_TABLET | Freq: Every day | ORAL | 0 refills | Status: DC

## 2019-03-13 ENCOUNTER — Ambulatory Visit (INDEPENDENT_AMBULATORY_CARE_PROVIDER_SITE_OTHER): Payer: 59 | Admitting: Family Medicine

## 2019-03-13 ENCOUNTER — Encounter: Payer: Self-pay | Admitting: Family Medicine

## 2019-03-13 ENCOUNTER — Other Ambulatory Visit: Payer: Self-pay

## 2019-03-13 DIAGNOSIS — J329 Chronic sinusitis, unspecified: Secondary | ICD-10-CM

## 2019-03-13 DIAGNOSIS — J310 Chronic rhinitis: Secondary | ICD-10-CM

## 2019-03-13 NOTE — Progress Notes (Signed)
Patient ID: Todd Ellis, male    DOB: July 20, 1956, 63 y.o.   MRN: [id number]  PCP: Susy Frizzle, MD  Virtual Visit via telephone  Phone visit arranged with Vivia Ewing for 03/13/19 at 11:30 AM EDT  Services provided today were via telemedicine through telephone call. Start of phone call:  12:46 PM  I verified that I was speaking with the correct person using two identifiers. Patient reported their location during encounter was at home  Patient consented to telephone visit   I conducted telephone visit from [facility]  Referring Provider:   Susy Frizzle, MD   All participants in encounter:  Myself and the patient   I discussed the limitations, risks, security and privacy concerns of performing an evaluation and management service by telephone and the availability of in person appointments. I also discussed with the patient that there may be a patient responsible charge related to this service. The patient expressed understanding and agreed to proceed.  Chief Complaint  Patient presents with  . URI    nasal sx? allergies    Subjective:   Todd Ellis is a 63 y.o. male, presents to clinic with CC of few days of nasal drainage, sneezing, post-nasal drip making his throat a little scratchy and yesterday loosing his voice a little bit with mild productive cough.  He's never felt bad, never had fever, sweats, chills, myalgias.  Started after working outside a lot over the weekend.  Has hx of seasonal allergies.  "mowed and bush hogged for 7-8 hours on the tractor and I shoulda had a mask on" Today he feels much better, sneezing has gotten a little better.     Patient Active Problem List   Diagnosis Date Noted  . OSA on CPAP 10/13/2016  . Paroxysmal atrial fibrillation ([place]) 07/07/2016  . Fatigue due to excessive exertion 07/07/2016  . Snoring 07/07/2016  . Atrial flutter ([place])   . Hyperlipidemia   . Hypertension   .  Diabetes mellitus without complication ([place])     Prior to Admission medications   Medication Sig Start Date End Date Taking? Authorizing Provider  amLODipine (NORVASC) 10 MG tablet Take 1 tablet (10 mg total) by mouth daily. 11/12/18   Susy Frizzle, MD  CINNAMON PO Take 1,000 mg by mouth 2 (two) times daily.     [provider]  Cyanocobalamin (VITAMIN B 12 PO) Take 1,000 mg by mouth daily.    [provider]  fluticasone (FLONASE) 50 MCG/ACT nasal spray Place 2 sprays into both nostrils daily.    [provider]  INVOKANA 300 MG TABS tablet TAKE 1 TABLET BY MOUTH EVERY DAY BEFORE BREAKFAST 12/19/18   Susy Frizzle, MD  lisinopril (PRINIVIL,ZESTRIL) 40 MG tablet Take 1 tablet (40 mg total) by mouth daily. 12/25/18   Despina Hick, MD  metFORMIN (GLUCOPHAGE) 1000 MG tablet TAKE 1 TABLET BY MOUTH TWICE A DAY 09/07/18   Susy Frizzle, MD  metoprolol succinate (TOPROL-XL) 50 MG 24 hr tablet TAKE 1 TABLET BY MOUTH EVERY DAY WITH OR IMMEDIATELY FOLLOWING A MEAL 12/19/18   Susy Frizzle, MD  Omega-3 Fatty Acids (FISH OIL) 1200 MG CAPS Take 3 capsules by mouth daily.    [provider]  rosuvastatin (CRESTOR) 40 MG tablet TAKE 1/2 TABLET BY MOUTH DAILY 12/19/18   Susy Frizzle, MD  sitaGLIPtin (JANUVIA) 100 MG tablet TAKE 1 TABLET BY MOUTH EVERY DAY 12/19/18  Susy Frizzle, MD  spironolactone (ALDACTONE) 25 MG tablet Take 1 tablet (25 mg total) by mouth daily. 12/25/18   Despina Hick, MD  vitamin C (ASCORBIC ACID) 500 MG tablet Take 500 mg by mouth daily.    [provider]    Allergies  Allergen Reactions  . Lipitor [Atorvastatin]     Myalgias  . Penicillin G Rash  . Penicillins Rash    Review of Systems 10 Systems reviewed and are negative for acute change except as noted in the HPI.    Objective:    There were no vitals filed for this visit.  BP 120/77 - per pt report  Physical Exam  Limited due to telephone  encounter Pt with slightly congested sounding phonation, but speech clear, no audible wheeze or stridor.  Occasional coughing. Answers questions appropriately     Assessment & Plan:     ICD-10-CM   1. Rhinosinusitis J32.9    cannot determine allergy vs viral, discussed possible COVID precautions/testing, pt declined     He wishes to not do any testing (if available) and he will tx with antihistamines and flonase, also advised saline nasal spray and decongestants.  URGED f/up with any worsening sx, and advised about isolation/quarantine precautions because unable to determine if his sx or COVID vs other viral URI vs allergic nasal sx.    Pt continued to state that he "didn't feel bad" but I explained at least 3-4 x that COVID can occur with mild sx and can occur with NO sx.  He declined testing, but agreed to notify us if he had any worsening or prolonged sx.   I discussed the assessment and treatment plan with the patient. The patient was provided an opportunity to ask questions and all were answered. The patient agreed with the plan and demonstrated an understanding of the instructions.   The patient was advised to call back or seek an in-person evaluation if the symptoms worsen or if the condition fails to improve as anticipated.  Phone call concluded at 12:59 PM I provided 13 minutes of non-face-to-face time during this encounter.  Delsa Grana, PA-C 03/13/19 12:46 PM

## 2019-03-20 ENCOUNTER — Other Ambulatory Visit: Payer: Self-pay | Admitting: Cardiology

## 2019-03-20 ENCOUNTER — Telehealth: Payer: Self-pay | Admitting: Family Medicine

## 2019-03-20 NOTE — Telephone Encounter (Signed)
Thanks so much. 

## 2019-03-20 NOTE — Telephone Encounter (Signed)
I called pt to see if he developed any newer sx but he did not answer. Please advise.

## 2019-03-20 NOTE — Telephone Encounter (Signed)
Spoke with pt and he is very understanding. I advised him to call back if he starts developing any sx.

## 2019-03-20 NOTE — Telephone Encounter (Signed)
In our health system I can only get him COVID testing with symptoms and if he is higher risk.  I cannot get him tested now with no symptoms. He may be able to go to private or for profit clinics or labs to get done, but currently with his resolved URI sx and no direct exposure, we cannot currently order test.  I did offer last week but he declined then.   Please let him know thank you

## 2019-03-20 NOTE — Telephone Encounter (Signed)
Patient did a phone visit with leisa not too long ago  He says that leisa told him to call back if he would like to get a covid test done, and he does want to do this

## 2019-03-24 ENCOUNTER — Other Ambulatory Visit: Payer: Self-pay | Admitting: Cardiology

## 2019-03-25 ENCOUNTER — Other Ambulatory Visit: Payer: Self-pay | Admitting: Family Medicine

## 2019-06-11 ENCOUNTER — Other Ambulatory Visit: Payer: Self-pay | Admitting: Family Medicine

## 2019-06-11 DIAGNOSIS — R000 Tachycardia, unspecified: Secondary | ICD-10-CM

## 2019-06-16 ENCOUNTER — Other Ambulatory Visit: Payer: Self-pay | Admitting: Cardiology

## 2019-07-11 ENCOUNTER — Other Ambulatory Visit: Payer: Self-pay | Admitting: Family Medicine

## 2019-08-16 ENCOUNTER — Other Ambulatory Visit: Payer: Self-pay | Admitting: Family Medicine

## 2019-09-12 ENCOUNTER — Other Ambulatory Visit: Payer: Self-pay | Admitting: Cardiology

## 2019-09-30 ENCOUNTER — Other Ambulatory Visit: Payer: Self-pay | Admitting: Family Medicine

## 2019-09-30 ENCOUNTER — Other Ambulatory Visit: Payer: Self-pay | Admitting: Cardiology

## 2019-10-01 ENCOUNTER — Other Ambulatory Visit: Payer: Self-pay

## 2019-10-01 ENCOUNTER — Ambulatory Visit: Payer: 59 | Admitting: Family Medicine

## 2019-10-01 ENCOUNTER — Ambulatory Visit
Admission: RE | Admit: 2019-10-01 | Discharge: 2019-10-01 | Disposition: A | Discharge status: Home / Self Care | Payer: 59 | Source: Ambulatory Visit | Attending: Family Medicine | Admitting: Family Medicine

## 2019-10-01 VITALS — BP 120/78 | HR 61 | Temp 97.9°F | Resp 18 | Ht 76.0 in | Wt 253.0 lb

## 2019-10-01 DIAGNOSIS — M5412 Radiculopathy, cervical region: Secondary | ICD-10-CM

## 2019-10-01 MED ORDER — TIZANIDINE HCL 4 MG PO TABS: 4.0000 mg | ORAL_TABLET | Freq: Four times a day (QID) | ORAL | 0 refills | Status: DC | PRN

## 2019-10-01 MED ORDER — METFORMIN HCL 1000 MG PO TABS: 1000.0000 mg | ORAL_TABLET | Freq: Two times a day (BID) | ORAL | 1 refills | Status: DC

## 2019-10-01 MED ORDER — PREDNISONE 20 MG PO TABS: ORAL_TABLET | ORAL | 0 refills | Status: DC

## 2019-10-01 NOTE — Progress Notes (Signed)
Subjective:    Patient ID: Todd Ellis, male    DOB: 1955/12/09, 63 y.o.   MRN: [id number]  HPI Patient states for the last 2 months he has had a pain on the right side of his neck that comes and goes and radiates into his posterior right shoulder.  He denies any numbness or weakness in his right arm.  He denies any loss of grip strength.  He has normal reflexes at his biceps, brachioradialis, and triceps today.  Muscle strength is 5/5 equal and symmetric in the upper extremities.  However he does report muscle spasms in the right side of his neck and in the trapezius muscle when the pain is severe.  He has tried Tylenol and turmeric with minimal relief.  He denies any neck injury.  He denies any fevers or chills. Past Medical History:  Diagnosis Date  . Atrial fibrillation ([place])    pt reported he though the onset was maybe 3 years ago, no blood thinner per pt.  . Atrial flutter ([place])   . Diabetes mellitus without complication ([place])   . Heart murmur   . Hyperlipidemia   . Hypertension    Past Surgical History:  Procedure Laterality Date  . CARDIOVERSION N/A 07/26/2016   Procedure: CARDIOVERSION;  Surgeon: Adrian Prows, MD;  Location: [facility];  Service: Cardiovascular;  Laterality: N/A;  . COLONOSCOPY     Current Outpatient Medications on File Prior to Visit  Medication Sig Dispense Refill  . amLODipine (NORVASC) 10 MG tablet TAKE 1 TABLET BY MOUTH EVERY DAY 90 tablet 2  . CINNAMON PO Take 1,000 mg by mouth 2 (two) times daily.     . Cyanocobalamin (VITAMIN B 12 PO) Take 1,000 mg by mouth daily.    Marland Kitchen FARXIGA 10 MG TABS tablet TAKE 1 TABLET BY MOUTH EVERY DAY REPLACES INVOKANA 90 tablet 0  . fluticasone (FLONASE) 50 MCG/ACT nasal spray Place 2 sprays into both nostrils daily.    Marland Kitchen lisinopril (ZESTRIL) 40 MG tablet TAKE 1 TABLET BY MOUTH EVERY DAY 90 tablet 1  . metoprolol succinate (TOPROL-XL) 50 MG 24 hr tablet TAKE 1 TABLET BY MOUTH EVERY DAY WITH OR IMMEDIATELY FOLLOWING A  MEAL 90 tablet 1  . Omega-3 Fatty Acids (FISH OIL) 1200 MG CAPS Take 3 capsules by mouth daily.    . rosuvastatin (CRESTOR) 40 MG tablet TAKE 1/2 TABLET BY MOUTH EVERY DAY 45 tablet 1  . sitaGLIPtin (JANUVIA) 100 MG tablet TAKE 1 TABLET BY MOUTH EVERY DAY 90 tablet 1  . spironolactone (ALDACTONE) 25 MG tablet TAKE 1 TABLET BY MOUTH EVERY DAY 90 tablet 0  . vitamin C (ASCORBIC ACID) 500 MG tablet Take 500 mg by mouth daily.     Current Facility-Administered Medications on File Prior to Visit  Medication Dose Route Frequency Provider Last Rate Last Admin  . 0.9 %  sodium chloride infusion  500 mL Intravenous Once Armbruster, Carlota Raspberry, MD       Allergies  Allergen Reactions  . Lipitor [Atorvastatin]     Myalgias  . Penicillin G Rash  . Penicillins Rash   Social History   Socioeconomic History  . Marital status: Married    Spouse name: Not on file  . Number of children: Not on file  . Years of education: Not on file  . Highest education level: Not on file  Occupational History  . Not on file  Tobacco Use  . Smoking status: Never Smoker  . Smokeless tobacco: Former  User    Types: Chew  . Tobacco comment: quit 2002  Substance and Sexual Activity  . Alcohol use: Yes    Comment: Occasional  . Drug use: No  . Sexual activity: Not on file  Other Topics Concern  . Not on file  Social History Narrative  . Not on file   Social Determinants of Health   Financial Resource Strain:   . Difficulty of Paying Living Expenses: Not on file  Food Insecurity:   . Worried About Charity fundraiser in the Last Year: Not on file  . Ran Out of Food in the Last Year: Not on file  Transportation Needs:   . Lack of Transportation (Medical): Not on file  . Lack of Transportation (Non-Medical): Not on file  Physical Activity:   . Days of Exercise per Week: Not on file  . Minutes of Exercise per Session: Not on file  Stress:   . Feeling of Stress : Not on file  Social Connections:   .  Frequency of Communication with Friends and Family: Not on file  . Frequency of Social Gatherings with Friends and Family: Not on file  . Attends Religious Services: Not on file  . Active Member of Clubs or Organizations: Not on file  . Attends Archivist Meetings: Not on file  . Marital Status: Not on file  Intimate Partner Violence:   . Fear of Current or Ex-Partner: Not on file  . Emotionally Abused: Not on file  . Physically Abused: Not on file  . Sexually Abused: Not on file      Review of Systems  All other systems reviewed and are negative.      Objective:   Physical Exam Vitals reviewed.  Constitutional:      Appearance: Normal appearance. He is obese.  Cardiovascular:     Rate and Rhythm: Normal rate and regular rhythm.     Pulses: Normal pulses.     Heart sounds: Normal heart sounds.  Pulmonary:     Effort: Pulmonary effort is normal. No respiratory distress.     Breath sounds: Normal breath sounds. No wheezing or rales.  Musculoskeletal:     Cervical back: Neck supple. No signs of trauma, rigidity or crepitus. Muscular tenderness present. Decreased range of motion.  Lymphadenopathy:     Cervical: No cervical adenopathy.  Neurological:     Mental Status: He is alert.           Assessment & Plan:  Cervical radiculopathy - Plan: predniSONE (DELTASONE) 20 MG tablet, tiZANidine (ZANAFLEX) 4 MG tablet, DG Cervical Spine Complete  I suspect cervical radiculopathy and compensatory muscle spasms due to likely a bulging disc versus arthritis.  Begin with an x-ray of the cervical spine.  Start the patient on a prednisone taper pack for what I suspect may be a bulging disc and he can use tizanidine 4 mg every 6 hours as needed for muscle spasms.

## 2019-10-18 ENCOUNTER — Encounter: Payer: Self-pay | Admitting: Family Medicine

## 2019-10-20 ENCOUNTER — Other Ambulatory Visit: Payer: Self-pay | Admitting: Family Medicine

## 2019-12-03 ENCOUNTER — Other Ambulatory Visit: Payer: Self-pay | Admitting: Family Medicine

## 2019-12-03 DIAGNOSIS — R000 Tachycardia, unspecified: Secondary | ICD-10-CM

## 2019-12-11 ENCOUNTER — Other Ambulatory Visit: Payer: Self-pay | Admitting: Cardiology

## 2020-03-07 ENCOUNTER — Other Ambulatory Visit: Payer: Self-pay | Admitting: Cardiology

## 2020-03-10 ENCOUNTER — Other Ambulatory Visit: Payer: Self-pay

## 2020-03-10 MED ORDER — SPIRONOLACTONE 25 MG PO TABS: 25.0000 mg | ORAL_TABLET | Freq: Every day | ORAL | 0 refills | Status: DC

## 2020-04-02 ENCOUNTER — Other Ambulatory Visit: Payer: Self-pay

## 2020-04-02 ENCOUNTER — Other Ambulatory Visit: Payer: 59

## 2020-04-02 DIAGNOSIS — E7800 Pure hypercholesterolemia, unspecified: Secondary | ICD-10-CM

## 2020-04-02 DIAGNOSIS — Z125 Encounter for screening for malignant neoplasm of prostate: Secondary | ICD-10-CM

## 2020-04-02 DIAGNOSIS — E119 Type 2 diabetes mellitus without complications: Secondary | ICD-10-CM

## 2020-04-02 DIAGNOSIS — Z0000 Encounter for general adult medical examination without abnormal findings: Secondary | ICD-10-CM

## 2020-04-03 LAB — COMPLETE METABOLIC PANEL WITH GFR
AG Ratio: 2.3 (calc) (ref 1.0–2.5)
ALT: 27 U/L (ref 9–46)
AST: 19 U/L (ref 10–35)
Albumin: 4.5 g/dL (ref 3.6–5.1)
Alkaline phosphatase (APISO): 43 U/L (ref 35–144)
BUN: 15 mg/dL (ref 7–25)
CO2: 27 mmol/L (ref 20–32)
Calcium: 9.7 mg/dL (ref 8.6–10.3)
Chloride: 103 mmol/L (ref 98–110)
Creat: 0.97 mg/dL (ref 0.70–1.25)
GFR, Est African American: 97 mL/min/{1.73_m2} (ref >=60)
GFR, Est Non African American: 83 mL/min/{1.73_m2} (ref >=60)
Globulin: 2 g/dL (calc) (ref 1.9–3.7)
Glucose, Bld: 125 mg/dL — ABNORMAL HIGH (ref 65–99)
Potassium: 4.7 mmol/L (ref 3.5–5.3)
Sodium: 139 mmol/L (ref 135–146)
Total Bilirubin: 0.8 mg/dL (ref 0.2–1.2)
Total Protein: 6.5 g/dL (ref 6.1–8.1)

## 2020-04-03 LAB — CBC WITH DIFFERENTIAL/PLATELET
Absolute Monocytes: 475 cells/uL (ref 200–950)
Basophils Absolute: 32 cells/uL (ref 0–200)
Basophils Relative: 0.6 %
Eosinophils Absolute: 38 cells/uL (ref 15–500)
Eosinophils Relative: 0.7 %
HCT: 45.9 % (ref 38.5–50.0)
Hemoglobin: 15.2 g/dL (ref 13.2–17.1)
Lymphs Abs: 1409 cells/uL (ref 850–3900)
MCH: 30 pg (ref 27.0–33.0)
MCHC: 33.1 g/dL (ref 32.0–36.0)
MCV: 90.7 fL (ref 80.0–100.0)
MPV: 11.5 fL (ref 7.5–12.5)
Monocytes Relative: 8.8 %
Neutro Abs: 3445 cells/uL (ref 1500–7800)
Neutrophils Relative %: 63.8 %
Platelets: 190 10*3/uL (ref 140–400)
RBC: 5.06 10*6/uL (ref 4.20–5.80)
RDW: 12 % (ref 11.0–15.0)
Total Lymphocyte: 26.1 %
WBC: 5.4 10*3/uL (ref 3.8–10.8)

## 2020-04-03 LAB — HEMOGLOBIN A1C
Hgb A1c MFr Bld: 7.1 % of total Hgb — ABNORMAL HIGH (ref <5.7)
Mean Plasma Glucose: 157 (calc)
eAG (mmol/L): 8.7 (calc)

## 2020-04-03 LAB — PSA: PSA: 0.8 ng/mL (ref <=4.0)

## 2020-04-03 LAB — LIPID PANEL
Cholesterol: 139 mg/dL (ref <200)
HDL: 34 mg/dL — ABNORMAL LOW (ref >=40)
LDL Cholesterol (Calc): 85 mg/dL (calc)
Non-HDL Cholesterol (Calc): 105 mg/dL (calc) (ref <130)
Total CHOL/HDL Ratio: 4.1 (calc) (ref <5.0)
Triglycerides: 102 mg/dL (ref <150)

## 2020-04-05 ENCOUNTER — Other Ambulatory Visit: Payer: Self-pay | Admitting: Family Medicine

## 2020-04-07 ENCOUNTER — Ambulatory Visit: Payer: 59 | Admitting: Family Medicine

## 2020-04-07 ENCOUNTER — Other Ambulatory Visit: Payer: Self-pay

## 2020-04-07 VITALS — BP 110/70 | HR 64 | Temp 97.6°F | Ht 76.0 in | Wt 240.0 lb

## 2020-04-07 DIAGNOSIS — I480 Paroxysmal atrial fibrillation: Secondary | ICD-10-CM

## 2020-04-07 DIAGNOSIS — E119 Type 2 diabetes mellitus without complications: Secondary | ICD-10-CM

## 2020-04-07 DIAGNOSIS — M5030 Other cervical disc degeneration, unspecified cervical region: Secondary | ICD-10-CM | POA: Diagnosis not present

## 2020-04-07 DIAGNOSIS — Z125 Encounter for screening for malignant neoplasm of prostate: Secondary | ICD-10-CM

## 2020-04-07 DIAGNOSIS — I10 Essential (primary) hypertension: Secondary | ICD-10-CM

## 2020-04-07 DIAGNOSIS — Z0001 Encounter for general adult medical examination with abnormal findings: Secondary | ICD-10-CM | POA: Diagnosis not present

## 2020-04-07 DIAGNOSIS — Z0000 Encounter for general adult medical examination without abnormal findings: Secondary | ICD-10-CM

## 2020-04-07 DIAGNOSIS — E7800 Pure hypercholesterolemia, unspecified: Secondary | ICD-10-CM

## 2020-04-07 MED ORDER — TIZANIDINE HCL 4 MG PO TABS: 4.0000 mg | ORAL_TABLET | Freq: Four times a day (QID) | ORAL | 1 refills | Status: DC | PRN
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
## 2020-04-09 ENCOUNTER — Telehealth: Payer: Self-pay | Admitting: Family Medicine

## 2020-04-09 ENCOUNTER — Other Ambulatory Visit: Payer: Self-pay

## 2020-04-09 NOTE — Telephone Encounter (Signed)
Pt would like Farxiga 90 day prescription instead of 30 day.

## 2020-04-09 NOTE — Telephone Encounter (Signed)
CB# [phone] Wilder Glade  Need refill please refill  for 90 supply to [facility] [address]

## 2020-04-09 NOTE — Telephone Encounter (Signed)
Pt has pending order from Dr. Dennard Schaumann already.

## 2020-04-14 ENCOUNTER — Other Ambulatory Visit: Payer: Self-pay

## 2020-04-14 ENCOUNTER — Other Ambulatory Visit: Payer: Self-pay | Admitting: Cardiology

## 2020-04-14 MED ORDER — LISINOPRIL 40 MG PO TABS: 40.0000 mg | ORAL_TABLET | Freq: Every day | ORAL | 0 refills | Status: DC

## 2020-04-15 ENCOUNTER — Ambulatory Visit: Payer: 59 | Admitting: Cardiology

## 2020-04-15 ENCOUNTER — Other Ambulatory Visit: Payer: Self-pay

## 2020-04-15 ENCOUNTER — Encounter: Payer: Self-pay | Admitting: Cardiology

## 2020-04-15 VITALS — BP 130/80 | HR 68 | Resp 16 | Ht 76.0 in | Wt 245.0 lb

## 2020-04-15 DIAGNOSIS — G4733 Obstructive sleep apnea (adult) (pediatric): Secondary | ICD-10-CM

## 2020-04-15 DIAGNOSIS — I10 Essential (primary) hypertension: Secondary | ICD-10-CM

## 2020-04-15 DIAGNOSIS — E7800 Pure hypercholesterolemia, unspecified: Secondary | ICD-10-CM

## 2020-04-15 DIAGNOSIS — Z8679 Personal history of other diseases of the circulatory system: Secondary | ICD-10-CM

## 2020-04-15 MED ORDER — LISINOPRIL 40 MG PO TABS: 40.0000 mg | ORAL_TABLET | Freq: Every day | ORAL | 3 refills | Status: DC

## 2020-04-15 NOTE — Progress Notes (Signed)
Primary Physician/Referring:  Susy Frizzle, MD  Patient ID: Todd Ellis, male    DOB: 1956/01/01, 64 y.o.   MRN: [id number]  Chief Complaint  Patient presents with  . Hypertension   HPI:   Visit date not found  Todd Ellis  is a 64 y.o. Caucasian male with history of atrial flutter cardioverted to sinus rhythm in 2017, hypertension, diabetes mellitus type 2, hyperlipidemia, mild obstructive sleep apnea not on CPAP was last seen by my partner Dr. Woody Seller 2 years ago, presents here for follow-up.  Patient had asymptomatic COVID-19 infection in February 2021.  He remains asymptomatic, has continued to lose weight and has been very careful with his diet.  He has not been able to tolerate CPAP.  Past Medical History:  Diagnosis Date  . Atrial fibrillation ([place])    pt reported he though the onset was maybe 3 years ago, no blood thinner per pt.  . Atrial flutter ([place])   . Diabetes mellitus without complication ([place])   . Heart murmur   . Hyperlipidemia   . Hypertension    Past Surgical History:  Procedure Laterality Date  . CARDIOVERSION N/A 07/26/2016   Procedure: CARDIOVERSION;  Surgeon: Adrian Prows, MD;  Location: [facility] ENDOSCOPY;  Service: Cardiovascular;  Laterality: N/A;  . COLONOSCOPY     Family History  Problem Relation Age of Onset  . Colon cancer Neg Hx   . Esophageal cancer Neg Hx   . Rectal cancer Neg Hx   . Stomach cancer Neg Hx     Social History   Tobacco Use  . Smoking status: Never Smoker  . Smokeless tobacco: Former Systems developer    Types: Chew  . Tobacco comment: quit 2002  Substance Use Topics  . Alcohol use: Yes    Comment: Occasional   Marital Status: Married  ROS  Review of Systems  Cardiovascular: Negative for chest pain, dyspnea on exertion and leg swelling.  Gastrointestinal: Negative for melena.   Objective  Blood pressure 130/80, pulse 68, resp. rate 16, height 6\' 4"  (1.93 m), weight 245 lb (111.1 kg), SpO2 96 %.  Vitals with BMI  04/15/2020 04/07/2020 10/01/2019  Height 6\' 4"  6\' 4"  6\' 4"   Weight 245 lbs 240 lbs 253 lbs  BMI 29.83 76.16 07.37  Systolic 106 269 485  Diastolic 80 70 78  Pulse 68 64 61     Physical Exam Cardiovascular:     Rate and Rhythm: Normal rate and regular rhythm.     Pulses: Intact distal pulses.     Heart sounds: Normal heart sounds. No murmur heard.  No gallop.      Comments: No leg edema, no JVD. Pulmonary:     Effort: Pulmonary effort is normal.     Breath sounds: Normal breath sounds.  Abdominal:     General: Bowel sounds are normal.     Palpations: Abdomen is soft.    Laboratory examination:   Recent Labs    04/02/20 0826  NA 139  K 4.7  CL 103  CO2 27  GLUCOSE 125*  BUN 15  CREATININE 0.97  CALCIUM 9.7  GFRNONAA 83  GFRAA 97   estimated creatinine clearance is 107.8 mL/min (by C-G formula based on SCr of 0.97 mg/dL).  CMP Latest Ref Rng & Units 04/02/2020 11/12/2018 02/28/2018  Glucose 65 - 99 mg/dL 125(H) 106(H) 153(H)  BUN 7 - 25 mg/dL 15 16 15   Creatinine 0.70 - 1.25 mg/dL 0.97 0.86 0.85  Sodium 135 -  146 mmol/L 139 139 138  Potassium 3.5 - 5.3 mmol/L 4.7 4.6 4.3  Chloride 98 - 110 mmol/L 103 104 103  CO2 20 - 32 mmol/L 27 24 28   Calcium 8.6 - 10.3 mg/dL 9.7 9.6 9.6  Total Protein 6.1 - 8.1 g/dL 6.5 6.6 6.5  Total Bilirubin 0.2 - 1.2 mg/dL 0.8 0.7 0.7  Alkaline Phos 40 - 115 U/L - - -  AST 10 - 35 U/L 19 15 14   ALT 9 - 46 U/L 27 26 24    CBC Latest Ref Rng & Units 04/02/2020 11/12/2018 02/28/2018  WBC 3.8 - 10.8 Thousand/uL 5.4 6.1 5.4  Hemoglobin 13.2 - 17.1 g/dL 15.2 15.8 15.9  Hematocrit 38 - 50 % 45.9 46.2 45.4  Platelets 140 - 400 Thousand/uL 190 211 179   Lipid Panel Recent Labs    04/02/20 0826  CHOL 139  TRIG 102  LDLCALC 85  HDL 34*  CHOLHDL 4.1    HEMOGLOBIN A1C Lab Results  Component Value Date   HGBA1C 7.1 (H) 04/02/2020   MPG 157 04/02/2020   TSH No results for input(s): TSH in the last 8760 hours.  Medications and allergies    Allergies  Allergen Reactions  . Lipitor [Atorvastatin]     Myalgias  . Penicillin G Rash  . Penicillins Rash     Current Outpatient Medications  Medication Instructions  . amLODipine (NORVASC) 10 MG tablet TAKE 1 TABLET BY MOUTH EVERY DAY  . CINNAMON PO 1,000 mg, Oral, 2 times daily  . Cyanocobalamin (VITAMIN B 12 PO) 1,000 mg, Oral, Daily  . FARXIGA 10 MG TABS tablet TAKE 1 TABLET BY MOUTH EVERY DAY REPLACES INVOKANA  . fluticasone (FLONASE) 50 MCG/ACT nasal spray 2 sprays, Daily  . lisinopril (ZESTRIL) 40 mg, Oral, Daily  . metFORMIN (GLUCOPHAGE) 1,000 mg, Oral, 2 times daily  . metoprolol succinate (TOPROL-XL) 50 MG 24 hr tablet TAKE 1 TABLET BY MOUTH EVERY DAY WITH OR IMMEDIATELY FOLLOWING A MEAL  . Omega-3 Fatty Acids (FISH OIL) 1200 MG CAPS 3 capsules, Daily  . rosuvastatin (CRESTOR) 40 MG tablet TAKE 1/2 TABLET BY MOUTH EVERY DAY  . sitaGLIPtin (JANUVIA) 100 MG tablet TAKE 1 TABLET BY MOUTH EVERY DAY  . spironolactone (ALDACTONE) 25 mg, Oral, Daily  . tiZANidine (ZANAFLEX) 4 mg, Oral, Every 6 hours PRN  . vitamin C (ASCORBIC ACID) 500 mg, Oral, Daily    Radiology:   No results found.  Cardiac Studies:   Lexiscan sestamibi stress test 07/04/2016: 1. Resting EKG demonstrates atrial flutter with variable controlled ventricular response, nonspecific ST-T wave abnormality. Stress EKG is nondiagnostic for ischemia as except pharmacologic stress test. 2. Myocardial perfusion imaging is normal. Overall left ventricular systolic function was normal without regional wall motion abnormalities. The LV was normal in size both in rest.  Direct current cardioversion- 07/26/2016: 50J x2, Aflutter to A. Fib then NSR with 120x1 J  Echocardiogram 06/08/2017: Left ventricle cavity is normal in size. Moderate concentric hypertrophy of the left ventricle. Normal global wall motion. Indeterminate diastolic filling pattern. Calculated EF 59%. Left atrial cavity is mildly dilated. Mild  tricuspid regurgitation. Pulmonary artery systolic pressure is estimated at 20-25 mm Hg. Mild to moderate pulmonic regurgitation. Improvement in LV systolic function compared to prior study dated 06/29/2016.  Event Monitor 30 days 01/01/2018:  Unscheduled transmission 01/01/2018: 8 beat run of NSVT @ 110 to 120 bpm at 11 am. NO symptoms reported. Otherwise event monitor for 30 days reveals normal sinus rhythm, no symptoms reported  overall.  EKG  EKG 04/15/2020: Normal sinus rhythm at the rate of 61 bpm, left atrial enlargement, otherwise normal EKG.   Assessment     ICD-10-CM   1. Essential hypertension  I10 EKG 12-Lead  2. Pure hypercholesterolemia  E78.00   3. OSA on CPAP  G47.33    Z99.89   4. History of atrial flutter. 07/26/2016: 50J x2, Aflutter to A. Fib then NSR with 120x1 J  Z86.79     CHA2DS2-VASc score- 2 with yearly risk of stroke- 2.2%. There are no discontinued medications.   Recommendations:   Visit date not found  Todd Ellis  is a 64 y.o. Caucasian male with history of atrial flutter cardioverted to sinus rhythm in 2017, hypertension, diabetes mellitus type 2, hyperlipidemia, mild obstructive sleep apnea not on CPAP was last seen by my partner Dr. Woody Seller 2 years ago, presents here for follow-up.  He had COVID-19 infection in February 2021 but fortunately remained asymptomatic.   Patient had asymptomatic COVID-19 infection in February 2021.  He remains asymptomatic, has continued to lose weight and has been very careful with his diet.  I discussed with him that he clearly is at risk for developing recurrence of atrial fibrillation in view of being overweight and also hypertension and diabetes.  Improved weight but his BMI closer to 25.  Has remains a significant concern from cardiac standpoint I will see him back on a as needed basis.    Adrian Prows, MD, [facility] 04/15/2020, [place] PM Office: [phone]

## 2020-05-01 ENCOUNTER — Other Ambulatory Visit: Payer: Self-pay | Admitting: Family Medicine

## 2020-05-01 DIAGNOSIS — M5030 Other cervical disc degeneration, unspecified cervical region: Secondary | ICD-10-CM

## 2020-05-01 NOTE — Telephone Encounter (Signed)
Ok to refill 

## 2020-05-11 ENCOUNTER — Other Ambulatory Visit: Payer: Self-pay | Admitting: Family Medicine

## 2020-05-11 DIAGNOSIS — I10 Essential (primary) hypertension: Secondary | ICD-10-CM

## 2020-05-26 ENCOUNTER — Other Ambulatory Visit: Payer: Self-pay | Admitting: Family Medicine

## 2020-05-26 DIAGNOSIS — M5030 Other cervical disc degeneration, unspecified cervical region: Secondary | ICD-10-CM

## 2020-05-26 NOTE — Telephone Encounter (Signed)
Ok to refill 

## 2020-05-31 ENCOUNTER — Other Ambulatory Visit: Payer: Self-pay | Admitting: Family Medicine

## 2020-05-31 DIAGNOSIS — R000 Tachycardia, unspecified: Secondary | ICD-10-CM

## 2020-06-03 ENCOUNTER — Other Ambulatory Visit: Payer: Self-pay | Admitting: Cardiology

## 2020-06-25 ENCOUNTER — Other Ambulatory Visit: Payer: Self-pay | Admitting: Family Medicine

## 2020-06-25 DIAGNOSIS — M5030 Other cervical disc degeneration, unspecified cervical region: Secondary | ICD-10-CM

## 2020-06-25 NOTE — Telephone Encounter (Signed)
Ok to refill 

## 2020-07-08 ENCOUNTER — Other Ambulatory Visit: Payer: Self-pay

## 2020-07-08 MED ORDER — DAPAGLIFLOZIN PROPANEDIOL 10 MG PO TABS: ORAL_TABLET | ORAL | 2 refills | Status: DC

## 2020-07-10 ENCOUNTER — Other Ambulatory Visit: Payer: Self-pay | Admitting: Family Medicine

## 2020-07-10 MED ORDER — METFORMIN HCL 1000 MG PO TABS: 1000.0000 mg | ORAL_TABLET | Freq: Two times a day (BID) | ORAL | 1 refills | Status: DC

## 2020-07-10 NOTE — Telephone Encounter (Signed)
Prescription sent to pharmacy.

## 2020-07-10 NOTE — Telephone Encounter (Signed)
Fax for refill  Pharmacy:[facility] [place].  Medication:metFORMIN (GLUCOPHAGE) 1000 MG tablet   Qty:180  QVO:HCSP 1 tablet (1,000 mg total) by mouth 2 (two) times daily.

## 2020-07-22 ENCOUNTER — Other Ambulatory Visit: Payer: Self-pay | Admitting: Family Medicine

## 2020-07-22 DIAGNOSIS — M5030 Other cervical disc degeneration, unspecified cervical region: Secondary | ICD-10-CM

## 2020-10-13 ENCOUNTER — Other Ambulatory Visit: Payer: Self-pay | Admitting: Sports Medicine

## 2020-10-13 DIAGNOSIS — M542 Cervicalgia: Secondary | ICD-10-CM

## 2020-10-24 ENCOUNTER — Other Ambulatory Visit: Payer: 59

## 2020-10-26 ENCOUNTER — Other Ambulatory Visit: Payer: Self-pay | Admitting: Student

## 2020-11-07 ENCOUNTER — Other Ambulatory Visit: Payer: Self-pay

## 2020-11-07 ENCOUNTER — Ambulatory Visit
Admission: RE | Admit: 2020-11-07 | Discharge: 2020-11-07 | Disposition: A | Discharge status: Home / Self Care | Payer: 59 | Source: Ambulatory Visit | Attending: Sports Medicine | Admitting: Sports Medicine

## 2020-11-07 DIAGNOSIS — M542 Cervicalgia: Secondary | ICD-10-CM

## 2020-11-12 DIAGNOSIS — I10 Essential (primary) hypertension: Secondary | ICD-10-CM | POA: Insufficient documentation

## 2020-11-12 DIAGNOSIS — Z6831 Body mass index (BMI) 31.0-31.9, adult: Secondary | ICD-10-CM | POA: Insufficient documentation

## 2020-11-17 DIAGNOSIS — M47812 Spondylosis without myelopathy or radiculopathy, cervical region: Secondary | ICD-10-CM | POA: Insufficient documentation

## 2020-11-30 ENCOUNTER — Other Ambulatory Visit: Payer: Self-pay

## 2020-11-30 ENCOUNTER — Ambulatory Visit: Payer: 59 | Admitting: Family Medicine

## 2020-11-30 VITALS — BP 132/80 | HR 70 | Temp 97.7°F | Resp 16 | Ht 76.0 in | Wt 243.0 lb

## 2020-11-30 DIAGNOSIS — I10 Essential (primary) hypertension: Secondary | ICD-10-CM

## 2020-11-30 DIAGNOSIS — E119 Type 2 diabetes mellitus without complications: Secondary | ICD-10-CM | POA: Diagnosis not present

## 2020-11-30 DIAGNOSIS — I480 Paroxysmal atrial fibrillation: Secondary | ICD-10-CM

## 2020-11-30 DIAGNOSIS — E7800 Pure hypercholesterolemia, unspecified: Secondary | ICD-10-CM

## 2020-11-30 MED ORDER — SHINGRIX 50 MCG/0.5ML IM SUSR: 0.5000 mL | Freq: Once | INTRAMUSCULAR | 1 refills | Status: AC

## 2020-11-30 NOTE — Progress Notes (Signed)
Subjective:    Patient ID: Todd Ellis, male    DOB: May 01, 1956, 65 y.o.   MRN: [id number]   Patient is here today only to get his shingles vaccine.  Unfortunately we do not carry the shingles vaccine.  However he is overdue for his diabetes check as his last A1c was back in July.  I would like to do lab work while he is here today.  His blood pressure is normal at 132/80.  He does have a history of atrial fibrillation however this is been more than 3 years ago and today he is in normal sinus rhythm.  He is not on any anticoagulant.  He does take an aspirin however he is temporarily discontinue that as he is scheduled to have an epidural steroid injection in his neck.  He denies any polyuria, polydipsia, blurry vision.  Diabetic foot exam is normal today.  He has excellent pulses in both feet and normal sensation to 10 g monofilament.  He denies any neuropathy.  He does have a 1/6 to 2/6 systolic ejection murmur.  Last echocardiogram of his heart that I can find in records was 2018.  At that time he had mild tricuspid regurg.  The murmur is extremely soft today and he is totally asymptomatic without any chest pain shortness of breath syncope or near syncope. Past Medical History:  Diagnosis Date  . Atrial fibrillation ([place])    pt reported he though the onset was maybe 3 years ago, no blood thinner per pt.  . Atrial flutter ([place])   . Diabetes mellitus without complication ([place])   . Heart murmur   . Hyperlipidemia   . Hypertension    Past Surgical History:  Procedure Laterality Date  . CARDIOVERSION N/A 07/26/2016   Procedure: CARDIOVERSION;  Surgeon: Adrian Prows, MD;  Location: [facility] ENDOSCOPY;  Service: Cardiovascular;  Laterality: N/A;  . COLONOSCOPY     Current Outpatient Medications on File Prior to Visit  Medication Sig Dispense Refill  . amLODipine (NORVASC) 10 MG tablet TAKE 1 TABLET BY MOUTH EVERY DAY 90 tablet 2  . CINNAMON PO Take 1,000 mg by mouth 2 (two) times daily.     .  Cyanocobalamin (VITAMIN B 12 PO) Take 1,000 mg by mouth daily.    . dapagliflozin propanediol (FARXIGA) 10 MG TABS tablet TAKE 1 TABLET BY MOUTH EVERY DAY REPLACES INVOKANA 90 tablet 2  . fluticasone (FLONASE) 50 MCG/ACT nasal spray Place 2 sprays into both nostrils daily. (Patient not taking: Reported on 04/15/2020)    . lisinopril (ZESTRIL) 40 MG tablet Take 1 tablet (40 mg total) by mouth daily. 90 tablet 3  . metFORMIN (GLUCOPHAGE) 1000 MG tablet Take 1 tablet (1,000 mg total) by mouth 2 (two) times daily. 180 tablet 1  . metoprolol succinate (TOPROL-XL) 50 MG 24 hr tablet TAKE 1 TABLET BY MOUTH EVERY DAY WITH OR IMMEDIATELY FOLLOWING A MEAL 90 tablet 1  . Omega-3 Fatty Acids (FISH OIL) 1200 MG CAPS Take 3 capsules by mouth daily.    . rosuvastatin (CRESTOR) 40 MG tablet TAKE 1/2 TABLET BY MOUTH EVERY DAY 45 tablet 1  . sitaGLIPtin (JANUVIA) 100 MG tablet TAKE 1 TABLET BY MOUTH EVERY DAY 90 tablet 1  . spironolactone (ALDACTONE) 25 MG tablet TAKE 1 TABLET BY MOUTH EVERY DAY 90 tablet 3  . tiZANidine (ZANAFLEX) 4 MG tablet TAKE 1 TABLET (4 MG TOTAL) BY MOUTH EVERY 6 (SIX) HOURS AS NEEDED FOR MUSCLE SPASMS. 90 tablet 0  . vitamin  C (ASCORBIC ACID) 500 MG tablet Take 500 mg by mouth daily.     Current Facility-Administered Medications on File Prior to Visit  Medication Dose Route Frequency Provider Last Rate Last Admin  . 0.9 %  sodium chloride infusion  500 mL Intravenous Once Armbruster, Carlota Raspberry, MD        Allergies  Allergen Reactions  . Lipitor [Atorvastatin]     Myalgias  . Penicillin G Rash  . Penicillins Rash   Social History   Socioeconomic History  . Marital status: Married    Spouse name: Not on file  . Number of children: 1  . Years of education: Not on file  . Highest education level: Not on file  Occupational History  . Not on file  Tobacco Use  . Smoking status: Never Smoker  . Smokeless tobacco: Former Systems developer    Types: Chew  . Tobacco comment: quit 2002  Vaping  Use  . Vaping Use: Never used  Substance and Sexual Activity  . Alcohol use: Yes    Comment: Occasional  . Drug use: No  . Sexual activity: Not on file  Other Topics Concern  . Not on file  Social History Narrative  . Not on file   Social Determinants of Health   Financial Resource Strain: Not on file  Food Insecurity: Not on file  Transportation Needs: Not on file  Physical Activity: Not on file  Stress: Not on file  Social Connections: Not on file  Intimate Partner Violence: Not on file      Review of Systems  All other systems reviewed and are negative.      Objective:   Physical Exam Vitals reviewed.  Constitutional:      General: He is not in acute distress.    Appearance: He is well-developed. He is not diaphoretic.  HENT:     Head: Normocephalic and atraumatic.     Right Ear: External ear normal.     Left Ear: External ear normal.     Nose: Nose normal.     Mouth/Throat:     Pharynx: No oropharyngeal exudate.  Eyes:     General: No scleral icterus.       Right eye: No discharge.        Left eye: No discharge.     Conjunctiva/sclera: Conjunctivae normal.     Pupils: Pupils are equal, round, and reactive to light.  Neck:     Thyroid: No thyromegaly.     Vascular: No JVD.     Trachea: No tracheal deviation.  Cardiovascular:     Rate and Rhythm: Normal rate and regular rhythm.     Heart sounds: Murmur heard.  No friction rub. No gallop.   Pulmonary:     Effort: Pulmonary effort is normal. No respiratory distress.     Breath sounds: Normal breath sounds. No stridor. No wheezing or rales.  Abdominal:     General: Bowel sounds are normal. There is no distension.     Palpations: Abdomen is soft. There is no mass.     Tenderness: There is no abdominal tenderness. There is no guarding or rebound.  Musculoskeletal:     Cervical back: Normal range of motion and neck supple.  Lymphadenopathy:     Cervical: No cervical adenopathy.  Skin:    General: Skin  is warm.  Neurological:     Mental Status: He is alert and oriented to person, place, and time.     Cranial Nerves: No cranial  nerve deficit.     Motor: No abnormal muscle tone.     Coordination: Coordination normal.     Deep Tendon Reflexes: Reflexes are normal and symmetric.  Psychiatric:        Behavior: Behavior normal.        Thought Content: Thought content normal.        Judgment: Judgment normal.         Assessment & Plan:  Paroxysmal atrial fibrillation (HCC)  Controlled type 2 diabetes mellitus without complication, without long-term current use of insulin (HCC) - Plan: Hemoglobin A1c, CBC with Differential/Platelet, COMPLETE METABOLIC PANEL WITH GFR, Lipid panel, Microalbumin, urine  Benign essential HTN  Pure hypercholesterolemia  I am sorry that we do not have the shingles shot but I will be glad to call in a prescription for that.  However I would like to get lab work while the patient is here today including an A1c.  Goal A1c is less than 7.  I would also like to check a CMP and a fasting lipid panel.  Ideally I like his LDL cholesterol to be less than 100.  I would like to check a urine microalbumin to ensure that his albumin to creatinine ratio is less than 30.  His blood pressure today is at goal.  He does have a history of atrial fibrillation in the past however he has been in normal sinus rhythm now for several years and is only treated with a baby aspirin.  Patient does have a faint cardiac murmur likely due to tricuspid regurg but he is asymptomatic and the murmur is extremely soft so we will just continue to monitor clinically

## 2020-12-01 LAB — COMPLETE METABOLIC PANEL WITH GFR
AG Ratio: 2.1 (calc) (ref 1.0–2.5)
ALT: 23 U/L (ref 9–46)
AST: 16 U/L (ref 10–35)
Albumin: 4.4 g/dL (ref 3.6–5.1)
Alkaline phosphatase (APISO): 53 U/L (ref 35–144)
BUN: 17 mg/dL (ref 7–25)
CO2: 27 mmol/L (ref 20–32)
Calcium: 9.5 mg/dL (ref 8.6–10.3)
Chloride: 102 mmol/L (ref 98–110)
Creat: 0.9 mg/dL (ref 0.70–1.25)
GFR, Est African American: 105 mL/min/{1.73_m2} (ref >=60)
GFR, Est Non African American: 91 mL/min/{1.73_m2} (ref >=60)
Globulin: 2.1 g/dL (calc) (ref 1.9–3.7)
Glucose, Bld: 164 mg/dL — ABNORMAL HIGH (ref 65–99)
Potassium: 4.4 mmol/L (ref 3.5–5.3)
Sodium: 138 mmol/L (ref 135–146)
Total Bilirubin: 0.6 mg/dL (ref 0.2–1.2)
Total Protein: 6.5 g/dL (ref 6.1–8.1)

## 2020-12-01 LAB — HEMOGLOBIN A1C
Hgb A1c MFr Bld: 8.1 % of total Hgb — ABNORMAL HIGH (ref <5.7)
Mean Plasma Glucose: 186 mg/dL
eAG (mmol/L): 10.3 mmol/L

## 2020-12-01 LAB — CBC WITH DIFFERENTIAL/PLATELET
Absolute Monocytes: 519 cells/uL (ref 200–950)
Basophils Absolute: 29 cells/uL (ref 0–200)
Basophils Relative: 0.5 %
Eosinophils Absolute: 80 cells/uL (ref 15–500)
Eosinophils Relative: 1.4 %
HCT: 45.6 % (ref 38.5–50.0)
Hemoglobin: 15.6 g/dL (ref 13.2–17.1)
Lymphs Abs: 1545 cells/uL (ref 850–3900)
MCH: 31.1 pg (ref 27.0–33.0)
MCHC: 34.2 g/dL (ref 32.0–36.0)
MCV: 90.8 fL (ref 80.0–100.0)
MPV: 11 fL (ref 7.5–12.5)
Monocytes Relative: 9.1 %
Neutro Abs: 3528 cells/uL (ref 1500–7800)
Neutrophils Relative %: 61.9 %
Platelets: 185 10*3/uL (ref 140–400)
RBC: 5.02 10*6/uL (ref 4.20–5.80)
RDW: 11.8 % (ref 11.0–15.0)
Total Lymphocyte: 27.1 %
WBC: 5.7 10*3/uL (ref 3.8–10.8)

## 2020-12-01 LAB — LIPID PANEL
Cholesterol: 164 mg/dL (ref <200)
HDL: 44 mg/dL (ref >=40)
LDL Cholesterol (Calc): 95 mg/dL (calc)
Non-HDL Cholesterol (Calc): 120 mg/dL (calc) (ref <130)
Total CHOL/HDL Ratio: 3.7 (calc) (ref <5.0)
Triglycerides: 156 mg/dL — ABNORMAL HIGH (ref <150)

## 2020-12-01 LAB — MICROALBUMIN, URINE: Microalb, Ur: 0.2 mg/dL

## 2020-12-05 ENCOUNTER — Other Ambulatory Visit: Payer: Self-pay | Admitting: Family Medicine

## 2020-12-05 DIAGNOSIS — R000 Tachycardia, unspecified: Secondary | ICD-10-CM

## 2020-12-14 ENCOUNTER — Other Ambulatory Visit: Payer: Self-pay | Admitting: Family Medicine

## 2021-01-12 ENCOUNTER — Other Ambulatory Visit: Payer: Self-pay | Admitting: Family Medicine

## 2021-01-12 DIAGNOSIS — M5030 Other cervical disc degeneration, unspecified cervical region: Secondary | ICD-10-CM

## 2021-01-12 NOTE — Telephone Encounter (Signed)
Ok to refill 

## 2021-02-05 ENCOUNTER — Other Ambulatory Visit: Payer: Self-pay | Admitting: *Deleted

## 2021-02-05 DIAGNOSIS — M5030 Other cervical disc degeneration, unspecified cervical region: Secondary | ICD-10-CM

## 2021-02-05 DIAGNOSIS — I10 Essential (primary) hypertension: Secondary | ICD-10-CM

## 2021-02-05 MED ORDER — AMLODIPINE BESYLATE 10 MG PO TABS: 1.0000 | ORAL_TABLET | Freq: Every day | ORAL | 2 refills | Status: DC

## 2021-02-05 NOTE — Telephone Encounter (Signed)
Ok to refill 

## 2021-02-08 MED ORDER — TIZANIDINE HCL 4 MG PO TABS
4.0000 mg | ORAL_TABLET | Freq: Four times a day (QID) | ORAL | 0 refills | Status: DC | PRN
Start: 2021-02-08 — End: 2021-02-25

## 2021-02-19 ENCOUNTER — Other Ambulatory Visit: Payer: Self-pay | Admitting: *Deleted

## 2021-02-19 MED ORDER — METFORMIN HCL 1000 MG PO TABS: 1000.0000 mg | ORAL_TABLET | Freq: Two times a day (BID) | ORAL | 1 refills | Status: DC

## 2021-02-24 ENCOUNTER — Other Ambulatory Visit: Payer: Self-pay | Admitting: Family Medicine

## 2021-02-24 DIAGNOSIS — M5030 Other cervical disc degeneration, unspecified cervical region: Secondary | ICD-10-CM

## 2021-02-24 NOTE — Telephone Encounter (Signed)
Ok to refill 

## 2021-03-12 ENCOUNTER — Other Ambulatory Visit: Payer: Self-pay | Admitting: Family Medicine

## 2021-03-12 DIAGNOSIS — M5030 Other cervical disc degeneration, unspecified cervical region: Secondary | ICD-10-CM

## 2021-04-05 ENCOUNTER — Other Ambulatory Visit: Payer: Self-pay | Admitting: Family Medicine

## 2021-04-15 ENCOUNTER — Encounter: Payer: Self-pay | Admitting: Cardiology

## 2021-04-15 ENCOUNTER — Ambulatory Visit: Payer: 59 | Admitting: Cardiology

## 2021-04-15 ENCOUNTER — Other Ambulatory Visit: Payer: Self-pay

## 2021-04-15 VITALS — BP 126/77 | HR 66 | Temp 98.0°F | Resp 16 | Ht 76.0 in | Wt 255.4 lb

## 2021-04-15 DIAGNOSIS — E1165 Type 2 diabetes mellitus with hyperglycemia: Secondary | ICD-10-CM

## 2021-04-15 DIAGNOSIS — Z8679 Personal history of other diseases of the circulatory system: Secondary | ICD-10-CM

## 2021-04-15 DIAGNOSIS — I10 Essential (primary) hypertension: Secondary | ICD-10-CM

## 2021-04-15 DIAGNOSIS — E7800 Pure hypercholesterolemia, unspecified: Secondary | ICD-10-CM

## 2021-04-15 NOTE — Progress Notes (Addendum)
Primary Physician/Referring:  Susy Frizzle, MD  Patient ID: Todd Ellis, male    DOB: 02-Jul-1956, 65 y.o.   MRN: [id number]  Chief Complaint  Patient presents with   Atrial Fibrillation   Hypertension   Follow-up    1 year   HPI:    Todd Ellis  is a 65 y.o. Caucasian male with history of atrial flutter cardioverted to sinus rhythm in 2017, hypertension, diabetes mellitus type 2, hyperlipidemia, mild obstructive sleep apnea not on CPAP was last seen by my partner Dr. Woody Seller 2 years ago, presents here for follow-up.  Patient presents today for follow up of atrial fibrillation and HTN. Patient reports doing well and denies fatigue and palpitations, which were his primary symptoms during his episode of a-fib in 2017. The patient also denies chest pain, shortness of breath, pre-syncope, syncope, dizziness, edema, or claudication. The patient only endorses occasional heart burn. The patient has not implemented any of the lifestyle modifications that were recommended at his last visit to decrease the chance of recurrence of a-fib. He has made no diet changes. He usually walks and push mows, but recent heat and issues with a bulging disk in his neck have limited his activity.   BP in office today is 126/77. Patient is currently on Amlodipine 10 mg, Metoprolol 50 mg, Spironolactone 25 mg, and Lisinopril 40mg . Patient does check his BP at home and states that it is usually around 120/75 at home.    Past Medical History:  Diagnosis Date   Atrial fibrillation ([place])    pt reported he though the onset was maybe 3 years ago, no blood thinner per pt.   Atrial flutter ([place])    Diabetes mellitus without complication ([place])    Heart murmur    Hyperlipidemia    Hypertension    Past Surgical History:  Procedure Laterality Date   CARDIOVERSION N/A 07/26/2016   Procedure: CARDIOVERSION;  Surgeon: Adrian Prows, MD;  Location: [facility] ENDOSCOPY;  Service: Cardiovascular;  Laterality: N/A;    COLONOSCOPY     Family History  Problem Relation Age of Onset   Hypertension Mother    Colon cancer Neg Hx    Esophageal cancer Neg Hx    Rectal cancer Neg Hx    Stomach cancer Neg Hx     Social History   Tobacco Use   Smoking status: Never   Smokeless tobacco: Former    Types: Chew    Quit date: 11/28/2000   Tobacco comments:    quit 2002  Substance Use Topics   Alcohol use: Yes    Comment: Occasional   Marital Status: Married  ROS  Review of Systems  Constitutional: Positive for weight gain. Negative for malaise/fatigue.  Cardiovascular:  Negative for chest pain, claudication, dyspnea on exertion, leg swelling, near-syncope, palpitations and syncope.  Gastrointestinal:  Positive for heartburn.  Neurological:  Negative for dizziness.  Objective  Blood pressure 126/77, pulse 66, temperature 98 F (36.7 C), temperature source Temporal, resp. rate 16, height 6\' 4"  (1.93 m), weight 255 lb 6.4 oz (115.8 kg), SpO2 98 %.  Vitals with BMI 04/15/2021 11/30/2020 04/15/2020  Height 6\' 4"  6\' 4"  6\' 4"   Weight 255 lbs 6 oz 243 lbs 245 lbs  BMI 31.1 86.57 84.69  Systolic 629 528 413  Diastolic 77 80 80  Pulse 66 70 68     Physical Exam Cardiovascular:     Rate and Rhythm: Normal rate and regular rhythm.     Pulses: Intact  distal pulses.     Heart sounds: Normal heart sounds. No murmur heard.   No gallop.     Comments: No leg edema, no JVD. Pulmonary:     Effort: Pulmonary effort is normal.     Breath sounds: Normal breath sounds.   Laboratory examination:   Lab work on 11/30/2020 revealed that the patient had hyperglycemia and an increased A1C of 8.1% (was 7.1% on 04/02/2020). Triglycerides were also elevated.   Recent Labs    11/30/20 0837  NA 138  K 4.4  CL 102  CO2 27  GLUCOSE 164*  BUN 17  CREATININE 0.90  CALCIUM 9.5  GFRNONAA 91  GFRAA 105   CrCl cannot be calculated (Patient's most recent lab result is older than the maximum 21 days allowed.).  CMP Latest Ref  Rng & Units 11/30/2020 04/02/2020 11/12/2018  Glucose 65 - 99 mg/dL 164(H) 125(H) 106(H)  BUN 7 - 25 mg/dL 17 15 16   Creatinine 0.70 - 1.25 mg/dL 0.90 0.97 0.86  Sodium 135 - 146 mmol/L 138 139 139  Potassium 3.5 - 5.3 mmol/L 4.4 4.7 4.6  Chloride 98 - 110 mmol/L 102 103 104  CO2 20 - 32 mmol/L 27 27 24   Calcium 8.6 - 10.3 mg/dL 9.5 9.7 9.6  Total Protein 6.1 - 8.1 g/dL 6.5 6.5 6.6  Total Bilirubin 0.2 - 1.2 mg/dL 0.6 0.8 0.7  Alkaline Phos 40 - 115 U/L - - -  AST 10 - 35 U/L 16 19 15   ALT 9 - 46 U/L 23 27 26    CBC Latest Ref Rng & Units 11/30/2020 04/02/2020 11/12/2018  WBC 3.8 - 10.8 Thousand/uL 5.7 5.4 6.1  Hemoglobin 13.2 - 17.1 g/dL 15.6 15.2 15.8  Hematocrit 38.5 - 50.0 % 45.6 45.9 46.2  Platelets 140 - 400 Thousand/uL 185 190 211   Lipid Panel Recent Labs    11/30/20 0837  CHOL 164  TRIG 156*  LDLCALC 95  HDL 44  CHOLHDL 3.7    HEMOGLOBIN A1C Lab Results  Component Value Date   HGBA1C 8.1 (H) 11/30/2020   MPG 186 11/30/2020   TSH No results for input(s): TSH in the last 8760 hours.  Medications and allergies   Allergies  Allergen Reactions   Lipitor [Atorvastatin]     Myalgias   Penicillin G Rash   Penicillins Rash     Current Outpatient Medications  Medication Instructions   amLODipine (NORVASC) 10 mg, Oral, Daily   CINNAMON PO 1,000 mg, Oral, 2 times daily   Cyanocobalamin (VITAMIN B 12 PO) 1,000 mg, Oral, Daily   FARXIGA 10 MG TABS tablet TAKE 1 TABLET BY MOUTH EVERY DAY REPLACES INVOKANA   fluticasone (FLONASE) 50 MCG/ACT nasal spray 2 sprays, Each Nare, Daily   lisinopril (ZESTRIL) 40 mg, Oral, Daily   meloxicam (MOBIC) 7.5 mg, Oral, Daily PRN   metFORMIN (GLUCOPHAGE) 1,000 mg, Oral, 2 times daily   metoprolol succinate (TOPROL-XL) 50 MG 24 hr tablet TAKE 1 TABLET BY MOUTH EVERY DAY WITH OR IMMEDIATELY FOLLOWING A MEAL   Omega-3 Fatty Acids (FISH OIL) 1200 MG CAPS 3 capsules, Daily   rosuvastatin (CRESTOR) 40 MG tablet TAKE 1/2 TABLET BY MOUTH EVERY  DAY   sitaGLIPtin (JANUVIA) 100 MG tablet TAKE 1 TABLET BY MOUTH EVERY DAY   spironolactone (ALDACTONE) 25 MG tablet TAKE 1 TABLET BY MOUTH EVERY DAY   tiZANidine (ZANAFLEX) 4 MG tablet TAKE 1 TABLET BY MOUTH EVERY 6 HOURS AS NEEDED FOR MUSCLE SPASMS.   vitamin C (  ASCORBIC ACID) 500 mg, Oral, Daily    Radiology:   No results found.  Cardiac Studies:   Lexiscan sestamibi stress test 07/04/2016: 1. Resting EKG demonstrates atrial flutter with variable controlled ventricular response, nonspecific ST-T wave abnormality. Stress EKG is nondiagnostic for ischemia as except pharmacologic stress test. 2. Myocardial perfusion imaging is normal. Overall left ventricular systolic function was normal without regional wall motion abnormalities. The LV was normal in size both in rest.  Direct current cardioversion- 07/26/2016: 50J x2, Aflutter to A. Fib then NSR with 120x1 J  Echocardiogram 06/08/2017: Left ventricle cavity is normal in size. Moderate concentric hypertrophy of the left ventricle. Normal global wall motion. Indeterminate diastolic filling pattern. Calculated EF 59%. Left atrial cavity is mildly dilated. Mild tricuspid regurgitation. Pulmonary artery systolic pressure is estimated at 20-25 mm Hg. Mild to moderate pulmonic regurgitation. Improvement in LV systolic function compared to prior study dated 06/29/2016.  Event Monitor 30 days 01/01/2018:  Unscheduled transmission 01/01/2018: 8 beat run of NSVT @ 110 to 120 bpm at 11 am. NO symptoms reported. Otherwise event monitor for 30 days reveals normal sinus rhythm, no symptoms reported overall.  EKG  EKG 04/15/2021: Sinus rhythm with first-degree AV block at rate of 63 bpm, left atrial enlargement, left axis deviation, left anterior fascicular block.  No evidence of ischemia, normal QT interval.  Compared to 04/15/2020, no significant change   EKG 04/15/2020: Normal sinus rhythm at the rate of 61 bpm, left atrial enlargement, otherwise  normal EKG.  Assessment     ICD-10-CM   1. Essential hypertension  I10 EKG 12-Lead    2. History of atrial flutter. 07/26/2016: 50J x2, Aflutter to A. Fib then NSR with 120x1 J  Z86.79     3. Pure hypercholesterolemia  E78.00     4. Type 2 diabetes mellitus with hyperglycemia, without long-term current use of insulin (HCC)  E11.65       CHA2DS2-VASc score- 2 with yearly risk of stroke- 2.2%. There are no discontinued medications.   Recommendations:   Visit date not found  Todd Ellis  is a 65 y.o. Caucasian male with history of atrial flutter cardioverted to sinus rhythm in 2017, hypertension, diabetes mellitus type 2, hyperlipidemia, mild obstructive sleep apnea not on CPAP was last seen by my partner Dr. Woody Seller 2 years ago, presents here for follow-up.  He had COVID-19 infection in February 2021 but fortunately remained asymptomatic.  Patient has been stable from an atrial fibrillation standpoint with no recurrences. Patient remains asymptomatic and a-fib was not present on today's EKG. EKG had no significant changes compared to one year ago. Patient is stable from a a-fib standpoint and should follow up as needed if symptoms reoccur or another episode of a-fib is detected.   Lab work on 11/30/2020 revealed that the patient's diabetes was not well controlled. Due to this and the patient's risk of a-fib recurrence, lifestyle modifications were discussed at length with the patient. Patient was encouraged to invest in an elliptical or stationary bicycle so that he can exercise without heat being a factor. Healthier diet was also encouraged. Patient was also encouraged to discuss and begin the Liraglutide injection with his PCP to assist with his weight loss and diabetes control.   Patient's BP remains stable and well controlled on his current regimen. Patient should continue his current medications and continue to perform at home BP monitoring.   Patient may follow up as needed.      Adrian Prows, PA-S 04/15/2021,  5:05 PM Office: [phone]

## 2021-05-05 ENCOUNTER — Other Ambulatory Visit: Payer: Self-pay | Admitting: Cardiology

## 2021-05-05 NOTE — Telephone Encounter (Signed)
A medication warning came up that patient also takes Spironolactone, is it ok for me to refill this medication? Please advise.

## 2021-05-05 NOTE — Telephone Encounter (Signed)
Yes you may. He has been on this since 2011, labs stable

## 2021-05-25 IMAGING — MR MR CERVICAL SPINE W/O CM
4 of 5 series · 27 of 48 positions shown · non-contrast
Comparison: Radiography 10/01/2019

CLINICAL DATA: Right-sided neck pain radiating to the right
shoulder, worsening over the last few years.

EXAM:
MRI CERVICAL SPINE WITHOUT CONTRAST
TECHNIQUE: Multiplanar, multisequence MR imaging of the cervical spine was
performed. No intravenous contrast was administered.

[Series 5: T2 · sagittal · 3.0mm · 0.55mm/px · 6 of 13 slices shown (1 of 2)]
[im 1/13]
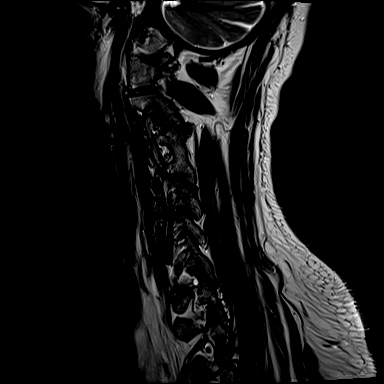
[im 3/13]
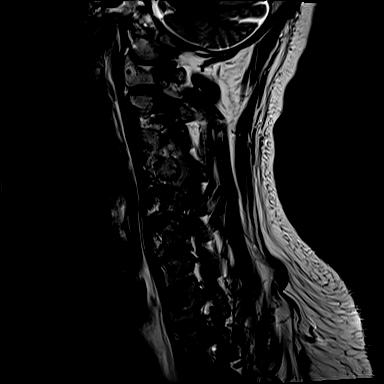
[im 5/13]
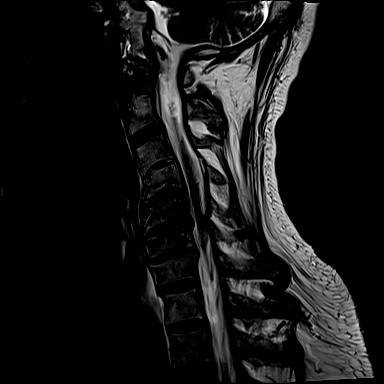
[im 8/13]
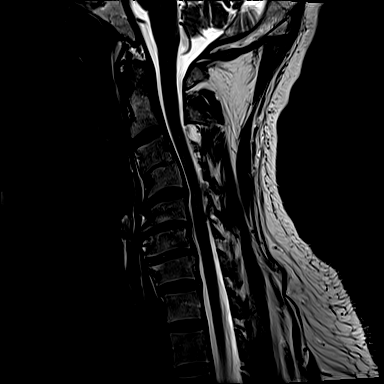
[im 10/13]
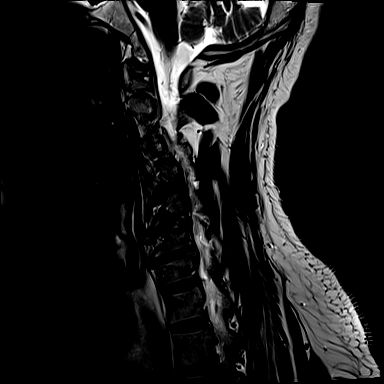
[im 13/13]
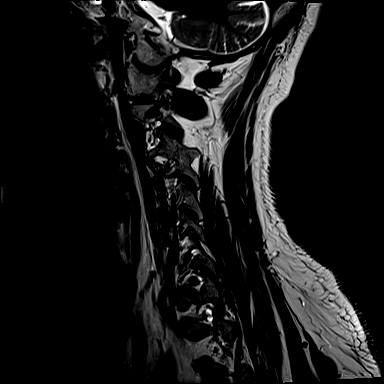

[Series 6: T1 · sagittal · 3.0mm · 0.66mm/px · 7 of 13 slices shown]
[im 1/13]
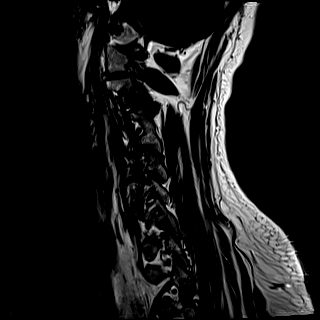
[im 3/13]
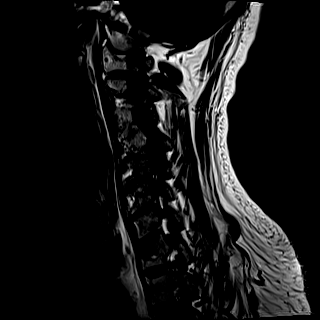
[im 5/13]
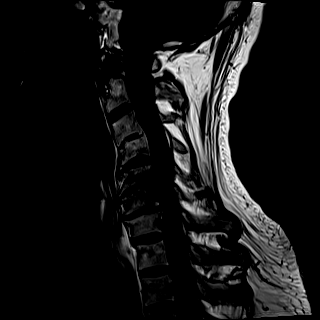
[im 7/13]
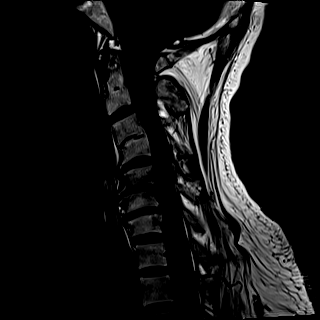
[im 9/13]
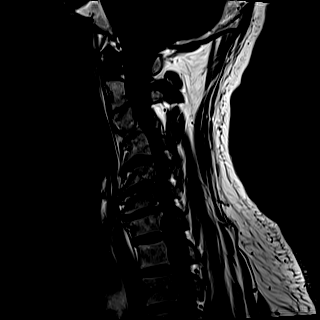
[im 11/13]
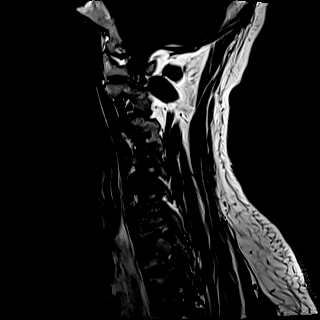
[im 13/13]
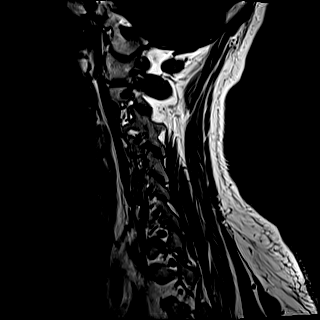

[Series 7: STIR · sagittal · 3.0mm · 0.33mm/px · 6 of 13 slices shown]
[im 1/13]
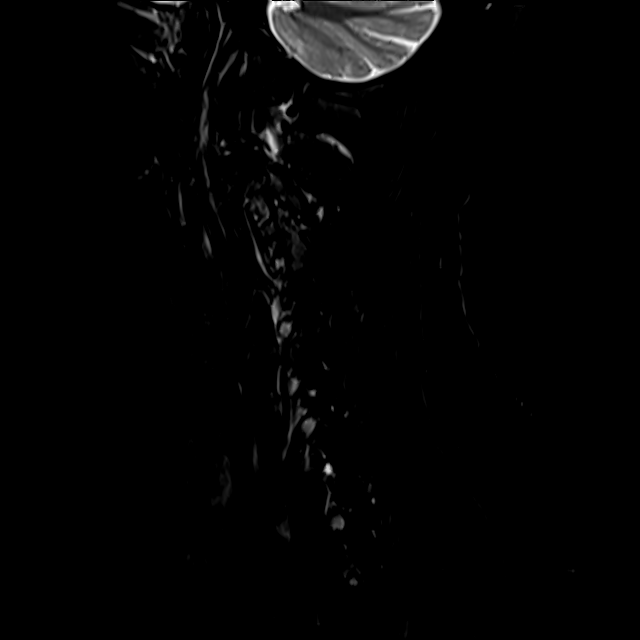
[im 3/13]
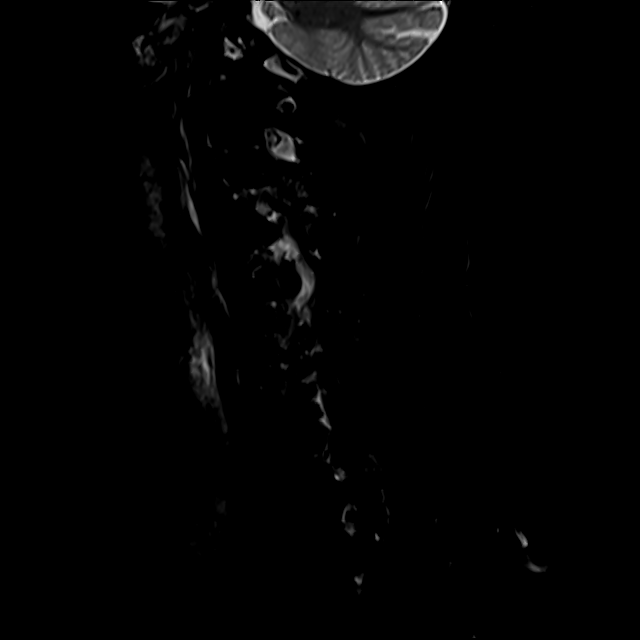
[im 5/13]
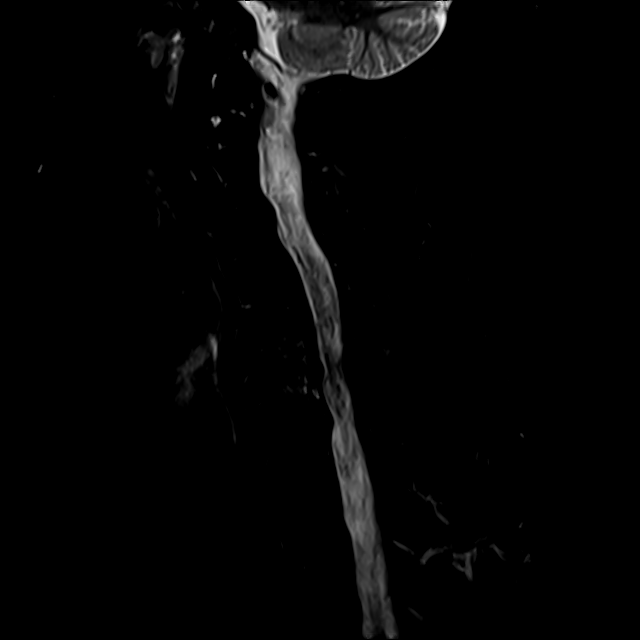
[im 7/13]
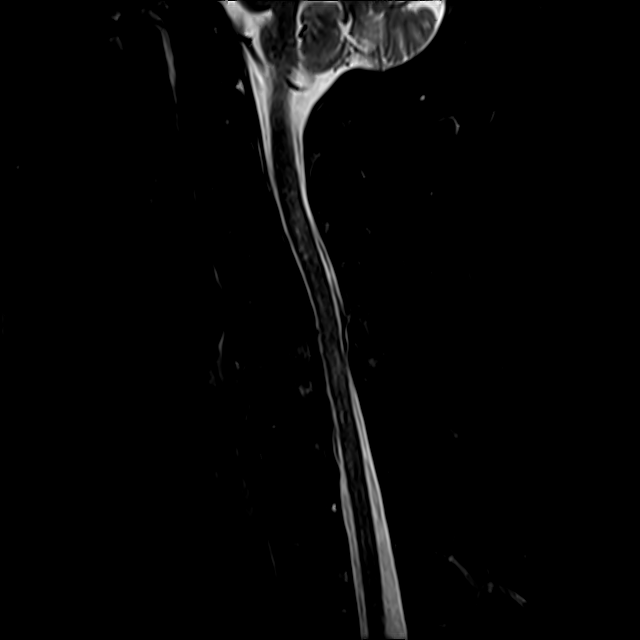
[im 9/13]
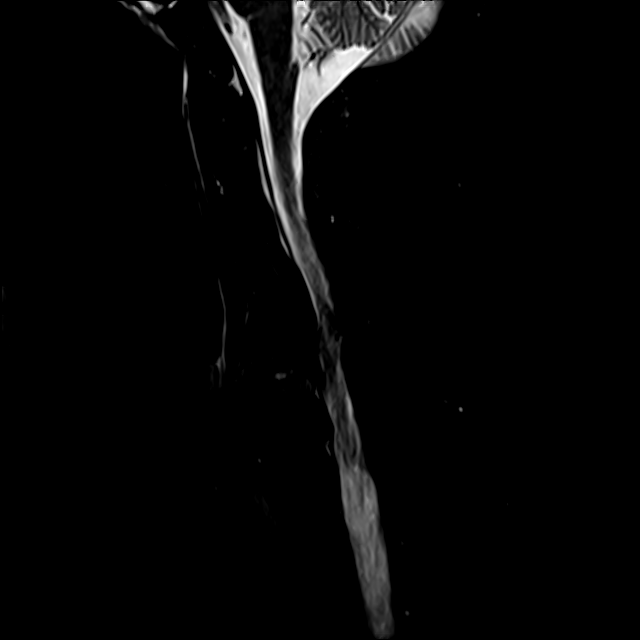
[im 11/13]
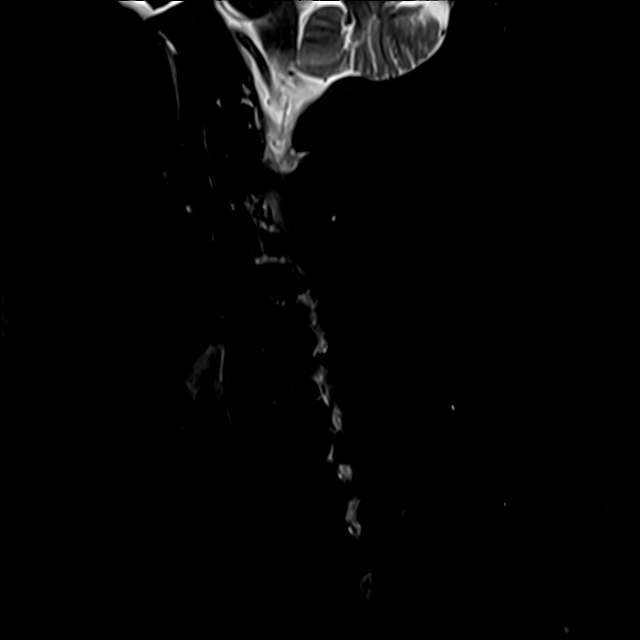

[Series 8: T2 · axial · 3.0mm · 0.50mm/px · z∈[-24,+80]mm · 8 of 28 slices shown (2 of 2)]
[im 1/28]
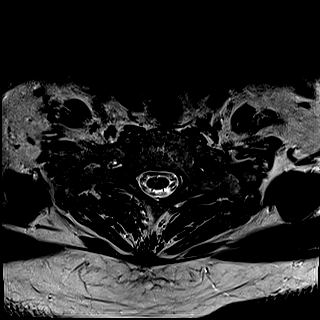
[im 5/28]
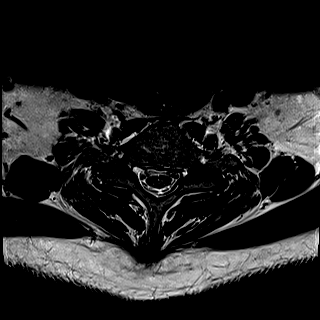
[im 9/28]
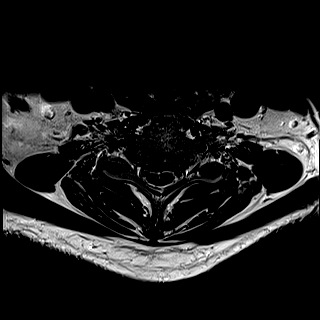
[im 13/28]
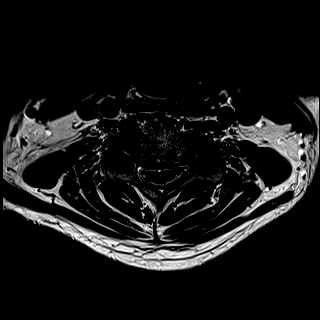
[im 15/28]
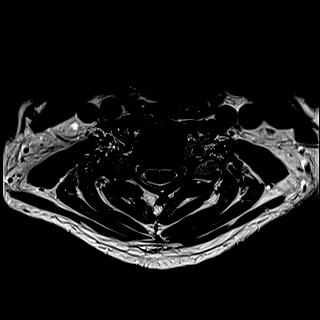
[im 19/28]
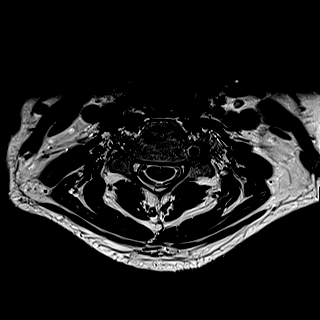
[im 23/28]
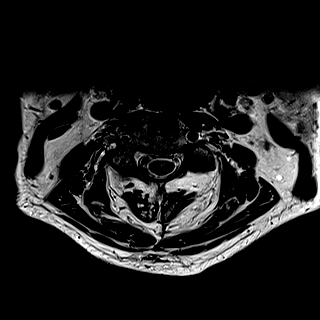
[im 28/28]
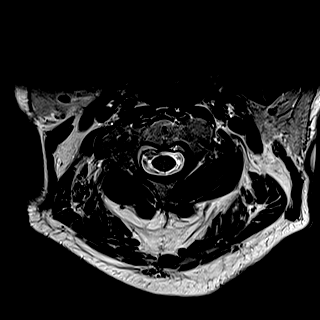

[27 of 48 positions shown; findings below may reference images not displayed]

FINDINGS: Alignment: Straightening and slight kyphotic curvature of the
cervical spine.

Vertebrae: Congenital failure of segmentation at C3-4. Discogenic
endplate changes at C5 and C6 with edema, which could relate to
regional neck pain.

Cord: No cord compression or primary cord lesion.

Posterior Fossa, vertebral arteries, paraspinal tissues: Negative

Disc levels:

Foramen magnum is widely patent.  C1-2 is normal.

C2-3: Facet arthropathy on the right with edematous change. This
joint is quite likely painful. Mild foraminal encroachment on the
right could affect the right C3 nerve.

C3-4: Congenital failure of segmentation as noted above. Sufficient
patency of the canal and foramina.

C4-5: Mild bulging of the disc. Mild facet osteoarthritis. No
compressive canal stenosis. AP diameter in the midline measures
mm. Mild bilateral foraminal narrowing, not grossly compressive.

C5-6: Disc degeneration with endplate osteophytes and bulging of the
disc. Narrowing of the canal with AP diameter in the midline 7.6 mm.
Effacement of the subarachnoid space but no compression cord.
Bilateral foraminal stenosis could affect either C6 nerve.
Discogenic marrow edema likely associated with regional neck.

C6-7: Endplate osteophytes and bulging of the disc. Narrowing of the
subarachnoid space but no compression of cord. AP diameter in the
midline 8 mm. Mild bilateral foraminal narrowing without definite
nerve compression.

C7-T1: Mild bulging of the disc.  No canal or foraminal stenosis.
IMPRESSION: 1. Congenital failure of segmentation at C3-4.
2. Facet arthropathy on the right at C2-3 with edematous change.
This is quite likely painful. Mild foraminal encroachment on the
right could affect the right C3 nerve.
3. Degenerative spondylosis at C5-6 with canal narrowing, AP
diameter of 7.6 mm. Effacement of the subarachnoid space but no
compression of the cord. Bilateral foraminal stenosis could compress
either or both C6 nerves. Discogenic endplate edema could be
associated with regional neck pain.
4. Degenerative spondylosis at C6-7 with mild bilateral foraminal
narrowing.
5. C4-5 spondylosis and mild facet osteoarthritis. Mild bilateral
foraminal narrowing, not grossly compressive.

## 2021-06-13 ENCOUNTER — Other Ambulatory Visit: Payer: Self-pay | Admitting: Family Medicine

## 2021-06-18 ENCOUNTER — Other Ambulatory Visit: Payer: Self-pay | Admitting: Cardiology

## 2021-06-19 ENCOUNTER — Other Ambulatory Visit: Payer: Self-pay | Admitting: Family Medicine

## 2021-06-19 DIAGNOSIS — R000 Tachycardia, unspecified: Secondary | ICD-10-CM

## 2021-06-23 ENCOUNTER — Other Ambulatory Visit: Payer: Self-pay | Admitting: Family Medicine

## 2021-07-27 ENCOUNTER — Other Ambulatory Visit: Payer: Self-pay

## 2021-07-27 ENCOUNTER — Other Ambulatory Visit: Payer: 59

## 2021-07-27 DIAGNOSIS — E7800 Pure hypercholesterolemia, unspecified: Secondary | ICD-10-CM

## 2021-07-27 DIAGNOSIS — Z13220 Encounter for screening for lipoid disorders: Secondary | ICD-10-CM

## 2021-07-27 DIAGNOSIS — I10 Essential (primary) hypertension: Secondary | ICD-10-CM

## 2021-07-27 DIAGNOSIS — I480 Paroxysmal atrial fibrillation: Secondary | ICD-10-CM

## 2021-07-27 DIAGNOSIS — Z136 Encounter for screening for cardiovascular disorders: Secondary | ICD-10-CM

## 2021-07-27 DIAGNOSIS — Z125 Encounter for screening for malignant neoplasm of prostate: Secondary | ICD-10-CM

## 2021-07-27 DIAGNOSIS — E119 Type 2 diabetes mellitus without complications: Secondary | ICD-10-CM

## 2021-07-28 LAB — LIPID PANEL
Cholesterol: 160 mg/dL (ref <200)
HDL: 52 mg/dL (ref >=40)
LDL Cholesterol (Calc): 92 mg/dL (calc)
Non-HDL Cholesterol (Calc): 108 mg/dL (calc) (ref <130)
Total CHOL/HDL Ratio: 3.1 (calc) (ref <5.0)
Triglycerides: 70 mg/dL (ref <150)

## 2021-07-28 LAB — COMPLETE METABOLIC PANEL WITH GFR
AG Ratio: 2 (calc) (ref 1.0–2.5)
ALT: 16 U/L (ref 9–46)
AST: 10 U/L (ref 10–35)
Albumin: 4.3 g/dL (ref 3.6–5.1)
Alkaline phosphatase (APISO): 50 U/L (ref 35–144)
BUN: 17 mg/dL (ref 7–25)
CO2: 25 mmol/L (ref 20–32)
Calcium: 9.4 mg/dL (ref 8.6–10.3)
Chloride: 102 mmol/L (ref 98–110)
Creat: 0.86 mg/dL (ref 0.70–1.35)
Globulin: 2.1 g/dL (calc) (ref 1.9–3.7)
Glucose, Bld: 140 mg/dL — ABNORMAL HIGH (ref 65–99)
Potassium: 4.5 mmol/L (ref 3.5–5.3)
Sodium: 137 mmol/L (ref 135–146)
Total Bilirubin: 0.5 mg/dL (ref 0.2–1.2)
Total Protein: 6.4 g/dL (ref 6.1–8.1)
eGFR: 97 mL/min/{1.73_m2} (ref >=60)

## 2021-07-28 LAB — CBC WITH DIFFERENTIAL/PLATELET
Absolute Monocytes: 520 cells/uL (ref 200–950)
Basophils Absolute: 33 cells/uL (ref 0–200)
Basophils Relative: 0.5 %
Eosinophils Absolute: 78 cells/uL (ref 15–500)
Eosinophils Relative: 1.2 %
HCT: 46.3 % (ref 38.5–50.0)
Hemoglobin: 15.3 g/dL (ref 13.2–17.1)
Lymphs Abs: 1495 cells/uL (ref 850–3900)
MCH: 30.1 pg (ref 27.0–33.0)
MCHC: 33 g/dL (ref 32.0–36.0)
MCV: 91 fL (ref 80.0–100.0)
MPV: 11 fL (ref 7.5–12.5)
Monocytes Relative: 8 %
Neutro Abs: 4375 cells/uL (ref 1500–7800)
Neutrophils Relative %: 67.3 %
Platelets: 176 10*3/uL (ref 140–400)
RBC: 5.09 10*6/uL (ref 4.20–5.80)
RDW: 11.9 % (ref 11.0–15.0)
Total Lymphocyte: 23 %
WBC: 6.5 10*3/uL (ref 3.8–10.8)

## 2021-07-28 LAB — PSA: PSA: 0.93 ng/mL (ref <=4.00)

## 2021-07-28 LAB — HEMOGLOBIN A1C
Hgb A1c MFr Bld: 6.7 % of total Hgb — ABNORMAL HIGH (ref <5.7)
Mean Plasma Glucose: 146 mg/dL
eAG (mmol/L): 8.1 mmol/L

## 2021-07-29 ENCOUNTER — Encounter: Payer: Self-pay | Admitting: Family Medicine

## 2021-07-29 ENCOUNTER — Telehealth: Payer: Self-pay | Admitting: *Deleted

## 2021-07-29 ENCOUNTER — Other Ambulatory Visit: Payer: Self-pay

## 2021-07-29 ENCOUNTER — Ambulatory Visit (INDEPENDENT_AMBULATORY_CARE_PROVIDER_SITE_OTHER): Payer: 59 | Admitting: Family Medicine

## 2021-07-29 VITALS — BP 130/80 | HR 62 | Temp 98.0°F | Ht 76.0 in | Wt 251.6 lb

## 2021-07-29 DIAGNOSIS — E119 Type 2 diabetes mellitus without complications: Secondary | ICD-10-CM

## 2021-07-29 DIAGNOSIS — I10 Essential (primary) hypertension: Secondary | ICD-10-CM | POA: Diagnosis not present

## 2021-07-29 DIAGNOSIS — R000 Tachycardia, unspecified: Secondary | ICD-10-CM | POA: Diagnosis not present

## 2021-07-29 DIAGNOSIS — Z0000 Encounter for general adult medical examination without abnormal findings: Secondary | ICD-10-CM | POA: Diagnosis not present

## 2021-07-29 DIAGNOSIS — I480 Paroxysmal atrial fibrillation: Secondary | ICD-10-CM

## 2021-07-29 DIAGNOSIS — Z23 Encounter for immunization: Secondary | ICD-10-CM

## 2021-07-29 DIAGNOSIS — M5030 Other cervical disc degeneration, unspecified cervical region: Secondary | ICD-10-CM

## 2021-07-29 DIAGNOSIS — E7800 Pure hypercholesterolemia, unspecified: Secondary | ICD-10-CM

## 2021-07-29 DIAGNOSIS — Z125 Encounter for screening for malignant neoplasm of prostate: Secondary | ICD-10-CM

## 2021-07-29 MED ORDER — METOPROLOL SUCCINATE ER 50 MG PO TB24
ORAL_TABLET | ORAL | 3 refills | Status: DC
Start: 2021-07-29 — End: 2022-09-12

## 2021-07-29 MED ORDER — MELOXICAM 15 MG PO TABS: 15.0000 mg | ORAL_TABLET | Freq: Every day | ORAL | 0 refills | Status: DC

## 2021-07-29 MED ORDER — SPIRONOLACTONE 25 MG PO TABS: 25.0000 mg | ORAL_TABLET | Freq: Every day | ORAL | 3 refills | Status: DC

## 2021-07-29 MED ORDER — AMLODIPINE BESYLATE 10 MG PO TABS: 10.0000 mg | ORAL_TABLET | Freq: Every day | ORAL | 3 refills | Status: DC

## 2021-07-29 MED ORDER — DAPAGLIFLOZIN PROPANEDIOL 10 MG PO TABS: ORAL_TABLET | ORAL | 3 refills | Status: DC

## 2021-07-29 MED ORDER — LISINOPRIL 40 MG PO TABS
40.0000 mg | ORAL_TABLET | Freq: Every day | ORAL | 3 refills | Status: DC
Start: 2021-07-29 — End: 2022-08-11

## 2021-07-29 MED ORDER — ROSUVASTATIN CALCIUM 40 MG PO TABS
20.0000 mg | ORAL_TABLET | Freq: Every day | ORAL | 3 refills | Status: DC
Start: 2021-07-29 — End: 2022-09-12

## 2021-07-29 MED ORDER — SITAGLIPTIN PHOSPHATE 100 MG PO TABS: ORAL_TABLET | ORAL | 3 refills | Status: DC

## 2021-07-29 MED ORDER — METFORMIN HCL 1000 MG PO TABS: 1000.0000 mg | ORAL_TABLET | Freq: Two times a day (BID) | ORAL | 3 refills | Status: DC

## 2021-07-29 MED ORDER — SULFAMETHOXAZOLE-TRIMETHOPRIM 800-160 MG PO TABS: 1.0000 | ORAL_TABLET | Freq: Two times a day (BID) | ORAL | 0 refills | Status: DC

## 2021-07-29 NOTE — Progress Notes (Signed)
Subjective:    Patient ID: Todd Ellis, male    DOB: 06-29-1956, 65 y.o.   MRN: [id number]  Medication Refill Patient is here today for his complete physical exam.  He had his colonoscopy performed 2020.  They found several polyps and his gastroenterologist recommended a repeat colonoscopy in 2023.  Patient continues to endorse right-sided neck pain.  Please see the MRI results.  He has degenerative disc disease in the cervical spine with multiple areas of possible nerve impingement.  He underwent 2 separate epidural steroid injections that gave him no relief.  He is taking 1 extra strength Tylenol twice daily.  He is not taking meloxicam.  He continues to report right-sided neck pain.  He also has an erythematous swollen painful patch of skin on the right side of his neck where he suffered an insect bite.  There is no fluctuance.  There is no abscess but it does appear to be infected.  Otherwise he is doing well.  He is due for the flu shot today.  He is also due for a COVID booster. Immunization History  Administered Date(s) Administered   Influenza Inj Mdck Quad Pf 06/14/2018   Influenza,inj,Quad PF,6+ Mos 07/20/2017, 06/14/2018, 07/11/2019   PFIZER(Purple Top)SARS-COV-2 Vaccination 01/28/2020, 02/18/2020   Pneumococcal Polysaccharide-23 04/16/2018   Td 02/03/2006   Tdap 02/23/2016   Zoster Recombinat (Shingrix) 03/12/2020    Lab on 07/27/2021  Component Date Value Ref Range Status   WBC 07/27/2021 6.5  3.8 - 10.8 Thousand/uL Final   RBC 07/27/2021 5.09  4.20 - 5.80 Million/uL Final   Hemoglobin 07/27/2021 15.3  13.2 - 17.1 g/dL Final   HCT 07/27/2021 46.3  38.5 - 50.0 % Final   MCV 07/27/2021 91.0  80.0 - 100.0 fL Final   MCH 07/27/2021 30.1  27.0 - 33.0 pg Final   MCHC 07/27/2021 33.0  32.0 - 36.0 g/dL Final   RDW 07/27/2021 11.9  11.0 - 15.0 % Final   Platelets 07/27/2021 176  140 - 400 Thousand/uL Final   MPV 07/27/2021 11.0  7.5 - 12.5 fL Final   Neutro Abs 07/27/2021  4,375  1,500 - 7,800 cells/uL Final   Lymphs Abs 07/27/2021 1,495  850 - 3,900 cells/uL Final   Absolute Monocytes 07/27/2021 520  200 - 950 cells/uL Final   Eosinophils Absolute 07/27/2021 78  15 - 500 cells/uL Final   Basophils Absolute 07/27/2021 33  0 - 200 cells/uL Final   Neutrophils Relative % 07/27/2021 67.3  % Final   Total Lymphocyte 07/27/2021 23.0  % Final   Monocytes Relative 07/27/2021 8.0  % Final   Eosinophils Relative 07/27/2021 1.2  % Final   Basophils Relative 07/27/2021 0.5  % Final   Glucose, Bld 07/27/2021 140 (A)  65 - 99 mg/dL Final   Comment: .            Fasting reference interval . For someone without known diabetes, a glucose value >125 mg/dL indicates that they may have diabetes and this should be confirmed with a follow-up test. .    BUN 07/27/2021 17  7 - 25 mg/dL Final   Creat 07/27/2021 0.86  0.70 - 1.35 mg/dL Final   eGFR 07/27/2021 97  > OR = 60 mL/min/1.84m Final   Comment: The eGFR is based on the CKD-EPI 2021 equation. To calculate  the new eGFR from a previous Creatinine or Cystatin C result, go to https://www.kidney.org/professionals/ kdoqi/gfr%5Fcalculator    BUN/Creatinine Ratio 167/20/9470NOT APPLICABLE  6 -  22 (calc) Final   Sodium 07/27/2021 137  135 - 146 mmol/L Final   Potassium 07/27/2021 4.5  3.5 - 5.3 mmol/L Final   Chloride 07/27/2021 102  98 - 110 mmol/L Final   CO2 07/27/2021 25  20 - 32 mmol/L Final   Calcium 07/27/2021 9.4  8.6 - 10.3 mg/dL Final   Total Protein 07/27/2021 6.4  6.1 - 8.1 g/dL Final   Albumin 07/27/2021 4.3  3.6 - 5.1 g/dL Final   Globulin 07/27/2021 2.1  1.9 - 3.7 g/dL (calc) Final   AG Ratio 07/27/2021 2.0  1.0 - 2.5 (calc) Final   Total Bilirubin 07/27/2021 0.5  0.2 - 1.2 mg/dL Final   Alkaline phosphatase (APISO) 07/27/2021 50  35 - 144 U/L Final   AST 07/27/2021 10  10 - 35 U/L Final   ALT 07/27/2021 16  9 - 46 U/L Final   Hgb A1c MFr Bld 07/27/2021 6.7 (A)  <5.7 % of total Hgb Final   Comment: For  someone without known diabetes, a hemoglobin A1c value of 6.5% or greater indicates that they may have  diabetes and this should be confirmed with a follow-up  test. . For someone with known diabetes, a value <7% indicates  that their diabetes is well controlled and a value  greater than or equal to 7% indicates suboptimal  control. A1c targets should be individualized based on  duration of diabetes, age, comorbid conditions, and  other considerations. . Currently, no consensus exists regarding use of hemoglobin A1c for diagnosis of diabetes for children. .    Mean Plasma Glucose 07/27/2021 146  mg/dL Final   eAG (mmol/L) 07/27/2021 8.1  mmol/L Final   Cholesterol 07/27/2021 160  <200 mg/dL Final   HDL 07/27/2021 52  > OR = 40 mg/dL Final   Triglycerides 07/27/2021 70  <150 mg/dL Final   LDL Cholesterol (Calc) 07/27/2021 92  mg/dL (calc) Final   Comment: Reference range: <100 . Desirable range <100 mg/dL for primary prevention;   <70 mg/dL for patients with CHD or diabetic patients  with > or = 2 CHD risk factors. Marland Kitchen LDL-C is now calculated using the Martin-Hopkins  calculation, which is a validated novel method providing  better accuracy than the Friedewald equation in the  estimation of LDL-C.  Cresenciano Genre et al. Annamaria Helling. 9622;297(98): 2061-2068  (http://education.[employer].com/faq/FAQ164)    Total CHOL/HDL Ratio 07/27/2021 3.1  <5.0 (calc) Final   Non-HDL Cholesterol (Calc) 07/27/2021 108  <130 mg/dL (calc) Final   Comment: For patients with diabetes plus 1 major ASCVD risk  factor, treating to a non-HDL-C goal of <100 mg/dL  (LDL-C of <70 mg/dL) is considered a therapeutic  option.    PSA 07/27/2021 0.93  < OR = 4.00 ng/mL Final   Comment: The total PSA value from this assay system is  standardized against the WHO standard. The test  result will be approximately 20% lower when compared  to the equimolar-standardized total PSA (Beckman  Coulter). Comparison of  serial PSA results should be  interpreted with this fact in mind. . This test was performed using the Siemens  chemiluminescent method. Values obtained from  different assay methods cannot be used interchangeably. PSA levels, regardless of value, should not be interpreted as absolute evidence of the presence or absence of disease.      Past Medical History:  Diagnosis Date   Atrial fibrillation ([place])    pt reported he though the onset was maybe 3 years ago, no blood thinner  per pt.   Atrial flutter ([place])    Diabetes mellitus without complication ([place])    Heart murmur    Hyperlipidemia    Hypertension    Past Surgical History:  Procedure Laterality Date   CARDIOVERSION N/A 07/26/2016   Procedure: CARDIOVERSION;  Surgeon: Adrian Prows, MD;  Location: [facility] ENDOSCOPY;  Service: Cardiovascular;  Laterality: N/A;   COLONOSCOPY     Current Outpatient Medications on File Prior to Visit  Medication Sig Dispense Refill   amLODipine (NORVASC) 10 MG tablet Take 1 tablet (10 mg total) by mouth daily. 90 tablet 2   CINNAMON PO Take 1,000 mg by mouth 2 (two) times daily.      Cyanocobalamin (VITAMIN B 12 PO) Take 1,000 mg by mouth daily.     FARXIGA 10 MG TABS tablet TAKE 1 TABLET BY MOUTH EVERY DAY REPLACES INVOKANA 90 tablet 2   fluticasone (FLONASE) 50 MCG/ACT nasal spray Place 2 sprays into both nostrils daily.     lisinopril (ZESTRIL) 40 MG tablet TAKE 1 TABLET BY MOUTH EVERY DAY 90 tablet 3   meloxicam (MOBIC) 7.5 MG tablet Take 7.5 mg by mouth daily as needed.     metFORMIN (GLUCOPHAGE) 1000 MG tablet Take 1 tablet (1,000 mg total) by mouth 2 (two) times daily. 180 tablet 1   metoprolol succinate (TOPROL-XL) 50 MG 24 hr tablet TAKE 1 TABLET BY MOUTH EVERY DAY WITH OR IMMEDIATELY FOLLOWING A MEAL 90 tablet 1   Omega-3 Fatty Acids (FISH OIL) 1200 MG CAPS Take 3 capsules by mouth daily.     rosuvastatin (CRESTOR) 40 MG tablet TAKE 1/2 TABLET BY MOUTH EVERY DAY 45 tablet 1   sitaGLIPtin  (JANUVIA) 100 MG tablet TAKE 1 TABLET BY MOUTH EVERY DAY 90 tablet 1   spironolactone (ALDACTONE) 25 MG tablet TAKE 1 TABLET BY MOUTH EVERY DAY 90 tablet 3   tiZANidine (ZANAFLEX) 4 MG tablet TAKE 1 TABLET BY MOUTH EVERY 6 HOURS AS NEEDED FOR MUSCLE SPASMS. 90 tablet 0   vitamin C (ASCORBIC ACID) 500 MG tablet Take 500 mg by mouth daily.     Current Facility-Administered Medications on File Prior to Visit  Medication Dose Route Frequency Provider Last Rate Last Admin   0.9 %  sodium chloride infusion  500 mL Intravenous Once Armbruster, Carlota Raspberry, MD        Allergies  Allergen Reactions   Lipitor [Atorvastatin]     Myalgias   Penicillin G Rash   Penicillins Rash   Social History   Socioeconomic History   Marital status: Married    Spouse name: Not on file   Number of children: 1   Years of education: Not on file   Highest education level: Not on file  Occupational History   Not on file  Tobacco Use   Smoking status: Never   Smokeless tobacco: Former    Types: Chew    Quit date: 11/28/2000   Tobacco comments:    quit 2002  Vaping Use   Vaping Use: Never used  Substance and Sexual Activity   Alcohol use: Yes    Comment: Occasional   Drug use: No   Sexual activity: Not on file  Other Topics Concern   Not on file  Social History Narrative   Not on file   Social Determinants of Health   Financial Resource Strain: Not on file  Food Insecurity: Not on file  Transportation Needs: Not on file  Physical Activity: Not on file  Stress: Not on  file  Social Connections: Not on file  Intimate Partner Violence: Not on file      Review of Systems  All other systems reviewed and are negative.     Objective:   Physical Exam Vitals reviewed.  Constitutional:      General: He is not in acute distress.    Appearance: He is well-developed. He is not diaphoretic.  HENT:     Head: Normocephalic and atraumatic.      Right Ear: External ear normal.     Left Ear: External  ear normal.     Nose: Nose normal.     Mouth/Throat:     Pharynx: No oropharyngeal exudate.  Eyes:     General: No scleral icterus.       Right eye: No discharge.        Left eye: No discharge.     Conjunctiva/sclera: Conjunctivae normal.     Pupils: Pupils are equal, round, and reactive to light.  Neck:     Thyroid: No thyromegaly.     Vascular: No JVD.     Trachea: No tracheal deviation.   Cardiovascular:     Rate and Rhythm: Normal rate and regular rhythm.     Heart sounds: Murmur heard.    No friction rub. No gallop.  Pulmonary:     Effort: Pulmonary effort is normal. No respiratory distress.     Breath sounds: Normal breath sounds. No stridor. No wheezing or rales.  Abdominal:     General: Bowel sounds are normal. There is no distension.     Palpations: Abdomen is soft. There is no mass.     Tenderness: There is no abdominal tenderness. There is no guarding or rebound.  Musculoskeletal:     Cervical back: Normal range of motion. Edema and erythema present. Pain with movement present.  Lymphadenopathy:     Cervical: No cervical adenopathy.  Skin:    General: Skin is warm.     Findings: Rash present.  Neurological:     Mental Status: He is alert and oriented to person, place, and time.     Cranial Nerves: No cranial nerve deficit.     Motor: No abnormal muscle tone.     Coordination: Coordination normal.     Deep Tendon Reflexes: Reflexes are normal and symmetric.  Psychiatric:        Behavior: Behavior normal.        Thought Content: Thought content normal.        Judgment: Judgment normal.        Assessment & Plan:  Need for immunization against influenza - Plan: Flu Vaccine QUAD 55moIM (Fluarix, Fluzone & Alfiuria Quad PF)  Tachycardia - Plan: metoprolol succinate (TOPROL-XL) 50 MG 24 hr tablet  Benign essential HTN - Plan: amLODipine (NORVASC) 10 MG tablet  Controlled type 2 diabetes mellitus without complication, without long-term current use of insulin  (HCC)  Paroxysmal atrial fibrillation (HCC)  Pure hypercholesterolemia  DDD (degenerative disc disease), cervical  General medical exam  Prostate cancer screening We will try treating his chronic neck pain with meloxicam 15 mg daily coupled with extra strength Tylenol twice daily.  If this does not work we could consider adding narcotic pain medication or gabapentin.  Blood pressure today is excellent.  Lab work is outstanding.  PSA is in normal range.  Colonoscopy is up-to-date.  He received his flu shot.  Recommended a COVID booster.  I will treat cellulitis on the right side of his neck with Bactrim  double strength tablets twice daily for 7 days.  The remainder of his preventative care is up-to-date.  A1c is acceptable at 6.7 and LDL cholesterol is acceptable at less than 100

## 2021-07-29 NOTE — Telephone Encounter (Signed)
Received call from pharmacy.   Reports contraindication between Bactrim DS and Aldactone. Reports that Bactrim DS can increase serum potassium.   PCP made aware and reports that Bactrim is short term and should not cause extreme spike in potassium.   Call placed to pharmacy and made aware.

## 2021-08-25 ENCOUNTER — Other Ambulatory Visit: Payer: Self-pay | Admitting: Family Medicine

## 2022-02-10 ENCOUNTER — Encounter: Payer: Self-pay | Admitting: Gastroenterology

## 2022-04-04 ENCOUNTER — Encounter: Payer: Self-pay | Admitting: Gastroenterology

## 2022-04-13 ENCOUNTER — Ambulatory Visit (AMBULATORY_SURGERY_CENTER): Payer: 59 | Admitting: *Deleted

## 2022-04-13 VITALS — Ht 76.0 in | Wt 249.0 lb

## 2022-04-13 DIAGNOSIS — Z86010 Personal history of colonic polyps: Secondary | ICD-10-CM

## 2022-04-13 MED ORDER — NA SULFATE-K SULFATE-MG SULF 17.5-3.13-1.6 GM/177ML PO SOLN: 1.0000 | Freq: Once | ORAL | 0 refills | Status: AC

## 2022-04-13 NOTE — Progress Notes (Signed)
No egg or soy allergy known to patient  No issues known to pt with past sedation with any surgeries or procedures Patient denies ever being told they had issues or difficulty with intubation  No FH of Malignant Hyperthermia Pt is not on diet pills Pt is not on  home 02  Pt is not on blood thinners  Pt denies issues with constipation  Past hx  A fib or A flutter Have any cardiac testing pending--pt denies   Pt instructed to use Singlecare.com or GoodRx for a price reduction on prep

## 2022-04-28 ENCOUNTER — Encounter: Payer: Self-pay | Admitting: Gastroenterology

## 2022-05-02 ENCOUNTER — Encounter: Payer: Self-pay | Admitting: Gastroenterology

## 2022-05-02 ENCOUNTER — Ambulatory Visit (AMBULATORY_SURGERY_CENTER): Payer: 59 | Admitting: Gastroenterology

## 2022-05-02 VITALS — BP 118/67 | HR 50 | Temp 98.0°F | Resp 17 | Ht 76.0 in | Wt 249.0 lb

## 2022-05-02 DIAGNOSIS — Z86010 Personal history of colonic polyps: Secondary | ICD-10-CM | POA: Diagnosis not present

## 2022-05-02 DIAGNOSIS — D125 Benign neoplasm of sigmoid colon: Secondary | ICD-10-CM | POA: Diagnosis not present

## 2022-05-02 DIAGNOSIS — D120 Benign neoplasm of cecum: Secondary | ICD-10-CM | POA: Diagnosis not present

## 2022-05-02 DIAGNOSIS — D127 Benign neoplasm of rectosigmoid junction: Secondary | ICD-10-CM | POA: Diagnosis not present

## 2022-05-02 DIAGNOSIS — D122 Benign neoplasm of ascending colon: Secondary | ICD-10-CM

## 2022-05-02 DIAGNOSIS — Z09 Encounter for follow-up examination after completed treatment for conditions other than malignant neoplasm: Secondary | ICD-10-CM

## 2022-05-02 DIAGNOSIS — D124 Benign neoplasm of descending colon: Secondary | ICD-10-CM

## 2022-05-02 MED ORDER — SODIUM CHLORIDE 0.9 % IV SOLN: 500.0000 mL | Freq: Once | INTRAVENOUS | Status: DC

## 2022-05-02 NOTE — Patient Instructions (Signed)
Handout on polyps, diverticulosis, and hemorrhoids. Await pathology results. Resume previous diet and continue present medications. Follow-up with PCP or Dermatology for perianal rash. Repeat colonoscopy for surveillance will be determined based off of pathology results.   YOU HAD AN ENDOSCOPIC PROCEDURE TODAY AT [facility]:   Refer to the procedure report that was given to you for any specific questions about what was found during the examination.  If the procedure report does not answer your questions, please call your gastroenterologist to clarify.  If you requested that your care partner not be given the details of your procedure findings, then the procedure report has been included in a sealed envelope for you to review at your convenience later.  YOU SHOULD EXPECT: Some feelings of bloating in the abdomen. Passage of more gas than usual.  Walking can help get rid of the air that was put into your GI tract during the procedure and reduce the bloating. If you had a lower endoscopy (such as a colonoscopy or flexible sigmoidoscopy) you may notice spotting of blood in your stool or on the toilet paper. If you underwent a bowel prep for your procedure, you may not have a normal bowel movement for a few days.  Please Note:  You might notice some irritation and congestion in your nose or some drainage.  This is from the oxygen used during your procedure.  There is no need for concern and it should clear up in a day or so.  SYMPTOMS TO REPORT IMMEDIATELY:  Following lower endoscopy (colonoscopy or flexible sigmoidoscopy):  Excessive amounts of blood in the stool  Significant tenderness or worsening of abdominal pains  Swelling of the abdomen that is new, acute  Fever of 100F or higher  For urgent or emergent issues, a gastroenterologist can be reached at any hour by calling [phone]. Do not use MyChart messaging for urgent concerns.    DIET:  We do recommend a small  meal at first, but then you may proceed to your regular diet.  Drink plenty of fluids but you should avoid alcoholic beverages for 24 hours.  ACTIVITY:  You should plan to take it easy for the rest of today and you should NOT DRIVE or use heavy machinery until tomorrow (because of the sedation medicines used during the test).    FOLLOW UP: Our staff will call the number listed on your records the next business day following your procedure.  We will call around 7:15- 8:00 am to check on you and address any questions or concerns that you may have regarding the information given to you following your procedure. If we do not reach you, we will leave a message.  If you develop any symptoms (ie: fever, flu-like symptoms, shortness of breath, cough etc.) before then, please call [phone].  If you test positive for Covid 19 in the 2 weeks post procedure, please call and report this information to [place].    If any biopsies were taken you will be contacted by phone or by letter within the next 1-3 weeks.  Please call us at [phone] if you have not heard about the biopsies in 3 weeks.    SIGNATURES/CONFIDENTIALITY: You and/or your care partner have signed paperwork which will be entered into your electronic medical record.  These signatures attest to the fact that that the information above on your After Visit Summary has been reviewed and is understood.  Full responsibility of the confidentiality of this discharge information lies with  you and/or your care-partner.

## 2022-05-02 NOTE — Progress Notes (Signed)
Sedate, gd SR, tolerated procedure well, VSS, report to RN 

## 2022-05-02 NOTE — Progress Notes (Signed)
Called to room to assist during endoscopic procedure.  Patient ID and intended procedure confirmed with present staff. Received instructions for my participation in the procedure from the performing physician.  

## 2022-05-02 NOTE — Progress Notes (Signed)
[facility] History and Physical   Primary Care Physician:  Susy Frizzle, MD   Reason for Procedure:   History of colon polyps  Plan:    colonoscopy     HPI: Todd Ellis is a 66 y.o. male  here for colonoscopy surveillance - 8 polyps removed 12/2018 including one advanced lesion. Patient denies any bowel symptoms at this time. No family history of colon cancer known. Otherwise feels well without any cardiopulmonary symptoms.   I have discussed risks / benefits of anesthesia and endoscopic procedure with Vivia Ewing and they wish to proceed with the exams as outlined today.    Past Medical History:  Diagnosis Date   Allergy    mild   Arthritis    wrist, shoulder and neck   Atrial fibrillation ([place])    pt reported he though the onset was maybe 3 years ago ( 2017 ) , no blood thinner per pt.   Atrial flutter ([place])    Diabetes mellitus without complication ([place])    Heart murmur    Hyperlipidemia    Hypertension    Sleep apnea    does not wear cpap    Past Surgical History:  Procedure Laterality Date   CARDIOVERSION N/A 07/26/2016   Procedure: CARDIOVERSION;  Surgeon: Adrian Prows, MD;  Location: [facility];  Service: Cardiovascular;  Laterality: N/A;   COLONOSCOPY     POLYPECTOMY      Prior to Admission medications   Medication Sig Start Date End Date Taking? Authorizing Provider  amLODipine (NORVASC) 10 MG tablet Take 1 tablet (10 mg total) by mouth daily. 07/29/21  Yes Susy Frizzle, MD  Cholecalciferol (VITAMIN D3 PO) Take by mouth.   Yes [provider]  CINNAMON PO Take 1,000 mg by mouth 2 (two) times daily.    Yes [provider]  Cyanocobalamin (VITAMIN B 12 PO) Take 1,000 mg by mouth daily.   Yes [provider]  dapagliflozin propanediol (FARXIGA) 10 MG TABS tablet TAKE 1 TABLET BY MOUTH EVERY DAY REPLACES [facility] 07/29/21  Yes Susy Frizzle, MD  lisinopril (ZESTRIL) 40 MG tablet Take 1 tablet (40  mg total) by mouth daily. 07/29/21  Yes Susy Frizzle, MD  metFORMIN (GLUCOPHAGE) 1000 MG tablet Take 1 tablet (1,000 mg total) by mouth 2 (two) times daily. 07/29/21  Yes Susy Frizzle, MD  metoprolol succinate (TOPROL-XL) 50 MG 24 hr tablet TAKE 1 TABLET BY MOUTH EVERY DAY WITH OR IMMEDIATELY FOLLOWING A MEAL 07/29/21  Yes Susy Frizzle, MD  Omega-3 Fatty Acids (FISH OIL) 1200 MG CAPS Take 3 capsules by mouth daily.   Yes [provider]  rosuvastatin (CRESTOR) 40 MG tablet Take 0.5 tablets (20 mg total) by mouth daily. 07/29/21  Yes Susy Frizzle, MD  sitaGLIPtin (JANUVIA) 100 MG tablet TAKE 1 TABLET BY MOUTH EVERY DAY 07/29/21  Yes Susy Frizzle, MD  spironolactone (ALDACTONE) 25 MG tablet Take 1 tablet (25 mg total) by mouth daily. 07/29/21  Yes Susy Frizzle, MD  vitamin C (ASCORBIC ACID) 500 MG tablet Take 500 mg by mouth daily.   Yes [provider]  fluticasone (FLONASE) 50 MCG/ACT nasal spray Place 2 sprays into both nostrils daily.    [provider]    Current Outpatient Medications  Medication Sig Dispense Refill   amLODipine (NORVASC) 10 MG tablet Take 1 tablet (10 mg total) by mouth daily. 90 tablet 3   Cholecalciferol (VITAMIN D3 PO) Take by  mouth.     CINNAMON PO Take 1,000 mg by mouth 2 (two) times daily.      Cyanocobalamin (VITAMIN B 12 PO) Take 1,000 mg by mouth daily.     dapagliflozin propanediol (FARXIGA) 10 MG TABS tablet TAKE 1 TABLET BY MOUTH EVERY DAY REPLACES INVOKANA 90 tablet 3   lisinopril (ZESTRIL) 40 MG tablet Take 1 tablet (40 mg total) by mouth daily. 90 tablet 3   metFORMIN (GLUCOPHAGE) 1000 MG tablet Take 1 tablet (1,000 mg total) by mouth 2 (two) times daily. 180 tablet 3   metoprolol succinate (TOPROL-XL) 50 MG 24 hr tablet TAKE 1 TABLET BY MOUTH EVERY DAY WITH OR IMMEDIATELY FOLLOWING A MEAL 90 tablet 3   Omega-3 Fatty Acids (FISH OIL) 1200 MG CAPS Take 3 capsules by mouth daily.     rosuvastatin  (CRESTOR) 40 MG tablet Take 0.5 tablets (20 mg total) by mouth daily. 45 tablet 3   sitaGLIPtin (JANUVIA) 100 MG tablet TAKE 1 TABLET BY MOUTH EVERY DAY 90 tablet 3   spironolactone (ALDACTONE) 25 MG tablet Take 1 tablet (25 mg total) by mouth daily. 90 tablet 3   vitamin C (ASCORBIC ACID) 500 MG tablet Take 500 mg by mouth daily.     fluticasone (FLONASE) 50 MCG/ACT nasal spray Place 2 sprays into both nostrils daily.     Current Facility-Administered Medications  Medication Dose Route Frequency Provider Last Rate Last Admin   0.9 %  sodium chloride infusion  500 mL Intravenous Once Cary Lothrop, Carlota Raspberry, MD        Allergies as of 05/02/2022 - Review Complete 05/02/2022  Allergen Reaction Noted   Lipitor [atorvastatin]  03/14/2013   Penicillin g Rash 09/07/2017   Penicillins Rash 03/14/2013    Family History  Problem Relation Age of Onset   Hypertension Mother    Colon cancer Neg Hx    Esophageal cancer Neg Hx    Rectal cancer Neg Hx    Stomach cancer Neg Hx    Colon polyps Neg Hx     Social History   Socioeconomic History   Marital status: Married    Spouse name: Not on file   Number of children: 1   Years of education: Not on file   Highest education level: Not on file  Occupational History   Not on file  Tobacco Use   Smoking status: Never   Smokeless tobacco: Former    Types: Chew    Quit date: 11/28/2000   Tobacco comments:    quit 2002  Vaping Use   Vaping Use: Never used  Substance and Sexual Activity   Alcohol use: Yes    Comment: Occasional   Drug use: No   Sexual activity: Not on file  Other Topics Concern   Not on file  Social History Narrative   Not on file   Social Determinants of Health   Financial Resource Strain: Not on file  Food Insecurity: Not on file  Transportation Needs: Not on file  Physical Activity: Not on file  Stress: Not on file  Social Connections: Not on file  Intimate Partner Violence: Not on file    Review of  Systems: All other review of systems negative except as mentioned in the HPI.  Physical Exam: Vital signs BP 102/62   Pulse 60   Temp 98 F (36.7 C) (Temporal)   Ht '6\' 4"'$  (1.93 m)   Wt 249 lb (112.9 kg)   SpO2 97%   BMI 30.31 kg/m  General:   Alert,  Well-developed, pleasant and cooperative in NAD Lungs:  Clear throughout to auscultation.   Heart:  Regular rate and rhythm Abdomen:  Soft, nontender and nondistended.   Neuro/Psych:  Alert and cooperative. Normal mood and affect. A and O x 3  Jolly Mango, MD [facility]

## 2022-05-02 NOTE — Op Note (Signed)
[facility] Patient Name: Todd Ellis Procedure Date: 05/02/2022 8:51 AM MRN: [id number] Endoscopist: Remo Lipps P. Havery Moros , MD Age: 66 Referring MD:  Date of Birth: 1956-04-03 Gender: Male Account #: [id number] Procedure:                Colonoscopy Indications:              High risk colon cancer surveillance: Personal                            history of colonic polyps - 8 polyps removed 12/2018                            - one advanced Medicines:                Monitored Anesthesia Care Procedure:                Pre-Anesthesia Assessment:                           - Prior to the procedure, a History and Physical                            was performed, and patient medications and                            allergies were reviewed. The patient's tolerance of                            previous anesthesia was also reviewed. The risks                            and benefits of the procedure and the sedation                            options and risks were discussed with the patient.                            All questions were answered, and informed consent                            was obtained. Prior Anticoagulants: The patient has                            taken no previous anticoagulant or antiplatelet                            agents. ASA Grade Assessment: III - A patient with                            severe systemic disease. After reviewing the risks                            and benefits, the patient was deemed in  satisfactory condition to undergo the procedure.                           After obtaining informed consent, the colonoscope                            was passed under direct vision. Throughout the                            procedure, the patient's blood pressure, pulse, and                            oxygen saturations were monitored continuously. The                            CF [id number] #[id number] was introduced  through the anus                            and advanced to the the cecum, identified by                            appendiceal orifice and ileocecal valve. The                            colonoscopy was performed without difficulty. The                            patient tolerated the procedure well. The quality                            of the bowel preparation was good. The ileocecal                            valve, appendiceal orifice, and rectum were                            photographed. Scope In: 9:06:13 AM Scope Out: 9:25:38 AM Scope Withdrawal Time: 0 hours 18 minutes 3 seconds  Total Procedure Duration: 0 hours 19 minutes 25 seconds  Findings:                 The perianal exam findings include a perianal rash                            in gluteal cleft - ?psoriasis?Marland Kitchen                           A 3 mm polyp was found in the cecum. The polyp was                            sessile. The polyp was removed with a cold snare.                            Resection and retrieval were complete.  Two flat polyps were found in the ascending colon.                            The polyps were 2 to 6 mm in size. These polyps                            were removed with a cold snare. Resection and                            retrieval were complete.                           A 2 to 3 mm polyp was found in the descending                            colon. The polyp was sessile. The polyp was removed                            with a cold snare. Resection and retrieval were                            complete.                           A 3 mm polyp was found in the sigmoid colon. The                            polyp was sessile. The polyp was removed with a                            cold snare. Resection and retrieval were complete.                           A 3 mm polyp was found in the recto-sigmoid colon.                            The polyp was sessile. The polyp was  removed with a                            cold snare. Resection and retrieval were complete.                           A few small-mouthed diverticula were found in the                            sigmoid colon.                           Internal hemorrhoids were found during                            retroflexion. The hemorrhoids were small.  The exam was otherwise without abnormality. Complications:            No immediate complications. Estimated blood loss:                            Minimal. Estimated Blood Loss:     Estimated blood loss was minimal. Impression:               - Perianal rash found on perianal exam potentially                            consistent with psoriasis.                           - One 3 mm polyp in the cecum, removed with a cold                            snare. Resected and retrieved.                           - Two 2 to 6 mm polyps in the ascending colon,                            removed with a cold snare. Resected and retrieved.                           - One 2 to 3 mm polyp in the descending colon,                            removed with a cold snare. Resected and retrieved.                           - One 3 mm polyp in the sigmoid colon, removed with                            a cold snare. Resected and retrieved.                           - One 3 mm polyp at the recto-sigmoid colon,                            removed with a cold snare. Resected and retrieved.                           - Diverticulosis in the sigmoid colon.                           - Internal hemorrhoids.                           - The examination was otherwise normal. Recommendation:           - Patient has a contact number available for  emergencies. The signs and symptoms of potential                            delayed complications were discussed with the                            patient. Return to normal activities tomorrow.                             Written discharge instructions were provided to the                            patient.                           - Resume previous diet.                           - Continue present medications.                           - Await pathology results.                           - Follow up with PCP or Dermatology if not already                            done so for perianal rash Carlota Raspberry. Dominiqua Cooner, MD 05/02/2022 9:33:25 AM This report has been signed electronically.

## 2022-05-03 ENCOUNTER — Telehealth: Payer: Self-pay

## 2022-05-03 NOTE — Telephone Encounter (Signed)
  Follow up Call-     05/02/2022    8:25 AM  Call back number  Post procedure Call Back phone  # [phone]  Permission to leave phone message Yes     Patient questions:  Do you have a fever, pain , or abdominal swelling? No. Pain Score  0 *  Have you tolerated food without any problems? Yes.    Have you been able to return to your normal activities? Yes.    Do you have any questions about your discharge instructions: Diet   No. Medications  No. Follow up visit  No.  Do you have questions or concerns about your Care? No.  Actions: * If pain score is 4 or above: No action needed, pain <4.

## 2022-05-05 ENCOUNTER — Encounter: Payer: Self-pay | Admitting: Gastroenterology

## 2022-05-23 ENCOUNTER — Ambulatory Visit (INDEPENDENT_AMBULATORY_CARE_PROVIDER_SITE_OTHER): Payer: 59 | Admitting: Family Medicine

## 2022-05-23 VITALS — BP 126/72 | HR 67 | Temp 97.6°F | Ht 76.0 in | Wt 250.8 lb

## 2022-05-23 DIAGNOSIS — E119 Type 2 diabetes mellitus without complications: Secondary | ICD-10-CM | POA: Diagnosis not present

## 2022-05-23 DIAGNOSIS — I480 Paroxysmal atrial fibrillation: Secondary | ICD-10-CM

## 2022-05-23 MED ORDER — AZITHROMYCIN 250 MG PO TABS: ORAL_TABLET | ORAL | 0 refills | Status: DC

## 2022-05-23 MED ORDER — PIOGLITAZONE HCL 30 MG PO TABS: 30.0000 mg | ORAL_TABLET | Freq: Every day | ORAL | 3 refills | Status: DC

## 2022-05-23 NOTE — Progress Notes (Signed)
Subjective:    Patient ID: Todd Ellis, male    DOB: Mar 26, 1956, 66 y.o.   MRN: [id number]  Patient is here today to recheck his diabetes.  He is currently on a combination of metformin, Farxiga, and Januvia.  However now that he is on Medicare, [place] will cost him more than $500 a month.  He wants to switch to something else.  He denies any chest pain shortness of breath or dyspnea on exertion. Past Medical History:  Diagnosis Date   Allergy    mild   Arthritis    wrist, shoulder and neck   Atrial fibrillation ([place])    pt reported he though the onset was maybe 3 years ago ( 2017 ) , no blood thinner per pt.   Atrial flutter ([place])    Diabetes mellitus without complication ([place])    Heart murmur    Hyperlipidemia    Hypertension    Sleep apnea    does not wear cpap   Past Surgical History:  Procedure Laterality Date   CARDIOVERSION N/A 07/26/2016   Procedure: CARDIOVERSION;  Surgeon: Adrian Prows, MD;  Location: [facility];  Service: Cardiovascular;  Laterality: N/A;   COLONOSCOPY     POLYPECTOMY     Current Outpatient Medications on File Prior to Visit  Medication Sig Dispense Refill   amLODipine (NORVASC) 10 MG tablet Take 1 tablet (10 mg total) by mouth daily. 90 tablet 3   Cholecalciferol (VITAMIN D3 PO) Take by mouth.     CINNAMON PO Take 1,000 mg by mouth 2 (two) times daily.      Cyanocobalamin (VITAMIN B 12 PO) Take 1,000 mg by mouth daily.     dapagliflozin propanediol (FARXIGA) 10 MG TABS tablet TAKE 1 TABLET BY MOUTH EVERY DAY REPLACES INVOKANA 90 tablet 3   fluticasone (FLONASE) 50 MCG/ACT nasal spray Place 2 sprays into both nostrils daily.     lisinopril (ZESTRIL) 40 MG tablet Take 1 tablet (40 mg total) by mouth daily. 90 tablet 3   metFORMIN (GLUCOPHAGE) 1000 MG tablet Take 1 tablet (1,000 mg total) by mouth 2 (two) times daily. 180 tablet 3   metoprolol succinate (TOPROL-XL) 50 MG 24 hr tablet TAKE 1 TABLET BY MOUTH EVERY DAY WITH OR  IMMEDIATELY FOLLOWING A MEAL 90 tablet 3   Omega-3 Fatty Acids (FISH OIL) 1200 MG CAPS Take 3 capsules by mouth daily.     rosuvastatin (CRESTOR) 40 MG tablet Take 0.5 tablets (20 mg total) by mouth daily. 45 tablet 3   sitaGLIPtin (JANUVIA) 100 MG tablet TAKE 1 TABLET BY MOUTH EVERY DAY 90 tablet 3   spironolactone (ALDACTONE) 25 MG tablet Take 1 tablet (25 mg total) by mouth daily. 90 tablet 3   vitamin C (ASCORBIC ACID) 500 MG tablet Take 500 mg by mouth daily.     No current facility-administered medications on file prior to visit.    Allergies  Allergen Reactions   Lipitor [Atorvastatin]     Myalgias   Penicillin G Rash   Penicillins Rash   Social History   Socioeconomic History   Marital status: Married    Spouse name: Not on file   Number of children: 1   Years of education: Not on file   Highest education level: Not on file  Occupational History   Not on file  Tobacco Use   Smoking status: Never   Smokeless tobacco: Former    Types: Chew    Quit date: 11/28/2000  Tobacco comments:    quit 2002  Vaping Use   Vaping Use: Never used  Substance and Sexual Activity   Alcohol use: Yes    Comment: Occasional   Drug use: No   Sexual activity: Not on file  Other Topics Concern   Not on file  Social History Narrative   Not on file   Social Determinants of Health   Financial Resource Strain: Not on file  Food Insecurity: Not on file  Transportation Needs: Not on file  Physical Activity: Not on file  Stress: Not on file  Social Connections: Not on file  Intimate Partner Violence: Not on file      Review of Systems  All other systems reviewed and are negative.      Objective:   Physical Exam Vitals reviewed.  Constitutional:      General: He is not in acute distress.    Appearance: He is well-developed. He is not diaphoretic.  HENT:     Head: Normocephalic and atraumatic.     Right Ear: External ear normal.     Left Ear: External ear normal.      Nose: Nose normal.     Mouth/Throat:     Pharynx: No oropharyngeal exudate.  Eyes:     General: No scleral icterus.       Right eye: No discharge.        Left eye: No discharge.     Conjunctiva/sclera: Conjunctivae normal.     Pupils: Pupils are equal, round, and reactive to light.  Neck:     Thyroid: No thyromegaly.     Vascular: No JVD.     Trachea: No tracheal deviation.  Cardiovascular:     Rate and Rhythm: Normal rate and regular rhythm.     Heart sounds: Murmur heard.     No friction rub. No gallop.  Pulmonary:     Effort: Pulmonary effort is normal. No respiratory distress.     Breath sounds: Normal breath sounds. No stridor. No wheezing or rales.  Abdominal:     General: Bowel sounds are normal. There is no distension.     Palpations: Abdomen is soft. There is no mass.     Tenderness: There is no abdominal tenderness. There is no guarding or rebound.  Musculoskeletal:     Cervical back: Normal range of motion and neck supple.  Lymphadenopathy:     Cervical: No cervical adenopathy.  Skin:    General: Skin is warm.  Neurological:     Mental Status: He is alert and oriented to person, place, and time.     Cranial Nerves: No cranial nerve deficit.     Motor: No abnormal muscle tone.     Coordination: Coordination normal.     Deep Tendon Reflexes: Reflexes are normal and symmetric.  Psychiatric:        Behavior: Behavior normal.        Thought Content: Thought content normal.        Judgment: Judgment normal.         Assessment & Plan:  Controlled type 2 diabetes mellitus without complication, without long-term current use of insulin (HCC) - Plan: Hemoglobin A1c, COMPLETE METABOLIC PANEL WITH GFR, Microalbumin, urine, Lipid panel  Paroxysmal atrial fibrillation (HCC) Continue metformin.  Recommended switching Januvia to Actos 30 mg a day to save cost.  I explained however that the patient is losing several intrinsic benefits if he stops [place].  I would like him  to check with his  insurance to see if they would cover Invokana or Jardiance instead.  If not, I would discontinue Wilder Glade and replace with glipizide in order to save the patient 45

## 2022-05-24 ENCOUNTER — Other Ambulatory Visit: Payer: Self-pay

## 2022-05-24 DIAGNOSIS — E119 Type 2 diabetes mellitus without complications: Secondary | ICD-10-CM

## 2022-05-24 LAB — LIPID PANEL
Cholesterol: 168 mg/dL (ref <200)
HDL: 41 mg/dL (ref >=40)
LDL Cholesterol (Calc): 95 mg/dL (calc)
Non-HDL Cholesterol (Calc): 127 mg/dL (calc) (ref <130)
Total CHOL/HDL Ratio: 4.1 (calc) (ref <5.0)
Triglycerides: 231 mg/dL — ABNORMAL HIGH (ref <150)

## 2022-05-24 LAB — COMPLETE METABOLIC PANEL WITH GFR
AG Ratio: 2 (calc) (ref 1.0–2.5)
ALT: 21 U/L (ref 9–46)
AST: 14 U/L (ref 10–35)
Albumin: 4.7 g/dL (ref 3.6–5.1)
Alkaline phosphatase (APISO): 49 U/L (ref 35–144)
BUN: 15 mg/dL (ref 7–25)
CO2: 29 mmol/L (ref 20–32)
Calcium: 10.4 mg/dL — ABNORMAL HIGH (ref 8.6–10.3)
Chloride: 101 mmol/L (ref 98–110)
Creat: 0.96 mg/dL (ref 0.70–1.35)
Globulin: 2.4 g/dL (calc) (ref 1.9–3.7)
Glucose, Bld: 129 mg/dL — ABNORMAL HIGH (ref 65–99)
Potassium: 4.6 mmol/L (ref 3.5–5.3)
Sodium: 139 mmol/L (ref 135–146)
Total Bilirubin: 0.5 mg/dL (ref 0.2–1.2)
Total Protein: 7.1 g/dL (ref 6.1–8.1)
eGFR: 88 mL/min/{1.73_m2} (ref >=60)

## 2022-05-24 LAB — HEMOGLOBIN A1C
Hgb A1c MFr Bld: 7.7 % of total Hgb — ABNORMAL HIGH (ref <5.7)
Mean Plasma Glucose: 174 mg/dL
eAG (mmol/L): 9.7 mmol/L

## 2022-05-24 LAB — MICROALBUMIN, URINE: Microalb, Ur: 0.2 mg/dL

## 2022-05-24 MED ORDER — GLIPIZIDE 10 MG PO TABS: 10.0000 mg | ORAL_TABLET | Freq: Two times a day (BID) | ORAL | 3 refills | Status: DC

## 2022-05-24 MED ORDER — PIOGLITAZONE HCL 30 MG PO TABS: 30.0000 mg | ORAL_TABLET | Freq: Every day | ORAL | 3 refills | Status: DC

## 2022-07-21 ENCOUNTER — Ambulatory Visit (INDEPENDENT_AMBULATORY_CARE_PROVIDER_SITE_OTHER): Payer: Medicare HMO | Admitting: Family Medicine

## 2022-07-21 VITALS — BP 132/78 | HR 70 | Ht 76.0 in | Wt 257.0 lb

## 2022-07-21 DIAGNOSIS — M545 Low back pain, unspecified: Secondary | ICD-10-CM | POA: Diagnosis not present

## 2022-07-21 MED ORDER — SULFAMETHOXAZOLE-TRIMETHOPRIM 800-160 MG PO TABS: 1.0000 | ORAL_TABLET | Freq: Two times a day (BID) | ORAL | 0 refills | Status: DC

## 2022-07-21 MED ORDER — TIZANIDINE HCL 4 MG PO CAPS: 4.0000 mg | ORAL_CAPSULE | Freq: Three times a day (TID) | ORAL | 0 refills | Status: DC | PRN

## 2022-07-21 NOTE — Progress Notes (Signed)
Subjective:    Patient ID: Todd Ellis, male    DOB: 02-20-1956, 66 y.o.   MRN: [id number] After I last saw the patient we stop Januvia and [place] and replace them with glipizide and Actos.  The patient has gained 7 pounds and is curious if he could change his medication to help him lose weight.  He is also been having low back pain.  The pain is located roughly around the level of L5-S1.  It is a dull constant ache.  Roughly around the same time, he developed increased urinary frequency and urgency.  He reports a brief strong stream and then he will develop urinary hesitancy reports a foul odor.  He denies any dysuria however.  Low back pain is gradually getting better.  He denies any sciatica. Past Medical History:  Diagnosis Date   Allergy    mild   Arthritis    wrist, shoulder and neck   Atrial fibrillation ([place])    pt reported he though the onset was maybe 3 years ago ( 2017 ) , no blood thinner per pt.   Atrial flutter ([place])    Diabetes mellitus without complication ([place])    Heart murmur    Hyperlipidemia    Hypertension    Sleep apnea    does not wear cpap   Past Surgical History:  Procedure Laterality Date   CARDIOVERSION N/A 07/26/2016   Procedure: CARDIOVERSION;  Surgeon: Adrian Prows, MD;  Location: [facility];  Service: Cardiovascular;  Laterality: N/A;   COLONOSCOPY     POLYPECTOMY     Current Outpatient Medications on File Prior to Visit  Medication Sig Dispense Refill   amLODipine (NORVASC) 10 MG tablet Take 1 tablet (10 mg total) by mouth daily. 90 tablet 3   azithromycin (ZITHROMAX) 250 MG tablet 2 tabs poqday1, 1 tab poqday 2-5 6 tablet 0   Cholecalciferol (VITAMIN D3 PO) Take by mouth.     CINNAMON PO Take 1,000 mg by mouth 2 (two) times daily.      Cyanocobalamin (VITAMIN B 12 PO) Take 1,000 mg by mouth daily.     fluticasone (FLONASE) 50 MCG/ACT nasal spray Place 2 sprays into both nostrils daily.     glipiZIDE (GLUCOTROL) 10 MG tablet Take 1 tablet  (10 mg total) by mouth 2 (two) times daily before a meal. 60 tablet 3   lisinopril (ZESTRIL) 40 MG tablet Take 1 tablet (40 mg total) by mouth daily. 90 tablet 3   metFORMIN (GLUCOPHAGE) 1000 MG tablet Take 1 tablet (1,000 mg total) by mouth 2 (two) times daily. 180 tablet 3   metoprolol succinate (TOPROL-XL) 50 MG 24 hr tablet TAKE 1 TABLET BY MOUTH EVERY DAY WITH OR IMMEDIATELY FOLLOWING A MEAL 90 tablet 3   Omega-3 Fatty Acids (FISH OIL) 1200 MG CAPS Take 3 capsules by mouth daily.     pioglitazone (ACTOS) 30 MG tablet Take 1 tablet (30 mg total) by mouth daily. THIS REPLACES JANUVIA 90 tablet 3   rosuvastatin (CRESTOR) 40 MG tablet Take 0.5 tablets (20 mg total) by mouth daily. 45 tablet 3   spironolactone (ALDACTONE) 25 MG tablet Take 1 tablet (25 mg total) by mouth daily. 90 tablet 3   vitamin C (ASCORBIC ACID) 500 MG tablet Take 500 mg by mouth daily.     No current facility-administered medications on file prior to visit.    Allergies  Allergen Reactions   Lipitor [Atorvastatin]     Myalgias   Penicillin G Rash  Penicillins Rash   Social History   Socioeconomic History   Marital status: Married    Spouse name: Not on file   Number of children: 1   Years of education: Not on file   Highest education level: Not on file  Occupational History   Not on file  Tobacco Use   Smoking status: Never   Smokeless tobacco: Former    Types: Chew    Quit date: 11/28/2000   Tobacco comments:    quit 2002  Vaping Use   Vaping Use: Never used  Substance and Sexual Activity   Alcohol use: Yes    Comment: Occasional   Drug use: No   Sexual activity: Not on file  Other Topics Concern   Not on file  Social History Narrative   Not on file   Social Determinants of Health   Financial Resource Strain: Not on file  Food Insecurity: Not on file  Transportation Needs: Not on file  Physical Activity: Not on file  Stress: Not on file  Social Connections: Not on file  Intimate Partner  Violence: Not on file      Review of Systems  All other systems reviewed and are negative.      Objective:   Physical Exam Vitals reviewed.  Constitutional:      General: He is not in acute distress.    Appearance: He is well-developed. He is not diaphoretic.  HENT:     Head: Normocephalic and atraumatic.     Right Ear: External ear normal.     Left Ear: External ear normal.     Nose: Nose normal.     Mouth/Throat:     Pharynx: No oropharyngeal exudate.  Eyes:     General: No scleral icterus.       Right eye: No discharge.        Left eye: No discharge.     Conjunctiva/sclera: Conjunctivae normal.     Pupils: Pupils are equal, round, and reactive to light.  Neck:     Thyroid: No thyromegaly.     Vascular: No JVD.     Trachea: No tracheal deviation.  Cardiovascular:     Rate and Rhythm: Normal rate and regular rhythm.     Heart sounds: Murmur heard.     No friction rub. No gallop.  Pulmonary:     Effort: Pulmonary effort is normal. No respiratory distress.     Breath sounds: Normal breath sounds. No stridor. No wheezing or rales.  Abdominal:     General: Bowel sounds are normal. There is no distension.     Palpations: Abdomen is soft. There is no mass.     Tenderness: There is no abdominal tenderness. There is no guarding or rebound.  Musculoskeletal:     Cervical back: Normal range of motion and neck supple.  Lymphadenopathy:     Cervical: No cervical adenopathy.  Skin:    General: Skin is warm.  Neurological:     Mental Status: He is alert and oriented to person, place, and time.     Cranial Nerves: No cranial nerve deficit.     Motor: No abnormal muscle tone.     Coordination: Coordination normal.     Deep Tendon Reflexes: Reflexes are normal and symmetric.  Psychiatric:        Behavior: Behavior normal.        Thought Content: Thought content normal.        Judgment: Judgment normal.  Assessment & Plan:  Acute midline low back pain without  sciatica I question of the patient may have prostatitis.  Therefore I recommended trying Bactrim double strength tablets twice daily for 7 days and then recheck.  I did give him tizanidine to use as a muscle relaxer.  If the pain improves no further work-up is necessary.  If the pain does not improve, I would recommend an x-ray of the spine.  I recommended switching glipizide to Ozempic to facilitate weight loss.  The patient will check on the price.

## 2022-08-01 ENCOUNTER — Other Ambulatory Visit: Payer: Self-pay | Admitting: Family Medicine

## 2022-08-02 ENCOUNTER — Other Ambulatory Visit: Payer: Self-pay | Admitting: Family Medicine

## 2022-08-02 DIAGNOSIS — I10 Essential (primary) hypertension: Secondary | ICD-10-CM

## 2022-08-06 ENCOUNTER — Other Ambulatory Visit: Payer: Self-pay | Admitting: Family Medicine

## 2022-08-08 NOTE — Telephone Encounter (Signed)
Requested medication (s) are due for refill today: yes  Requested medication (s) are on the active medication list: yes  Last refill:  07/29/21  Future visit scheduled:yes  Notes to clinic:  Unable to refill per protocol, rx was refused previously due to rx is too soon. Rx needs refill, routing for review.     Requested Prescriptions  Pending Prescriptions Disp Refills   lisinopril (ZESTRIL) 40 MG tablet [Pharmacy Med Name: LISINOPRIL 40 MG TABLET] 90 tablet 3    Sig: TAKE 1 TABLET BY MOUTH EVERY DAY     Cardiovascular:  ACE Inhibitors Failed - 08/06/2022  8:26 AM      Failed - Valid encounter within last 6 months    Recent Outpatient Visits           1 year ago Need for immunization against influenza   [place] [facility] Schaumann, Cammie Mcgee, MD   1 year ago Paroxysmal atrial fibrillation ([place])   [place] [facility] Schaumann, Cammie Mcgee, MD   2 years ago DDD (degenerative disc disease), cervical   [facility], Cammie Mcgee, MD   2 years ago Cervical radiculopathy   [place] Susy Frizzle, MD   3 years ago [place] Delsa Grana, PA-C       Future Appointments             In 5 months Pickard, Cammie Mcgee, MD [place]            Passed - Cr in normal range and within 180 days    Creat  Date Value Ref Range Status  05/23/2022 0.96 0.70 - 1.35 mg/dL Final         Passed - K in normal range and within 180 days    Potassium  Date Value Ref Range Status  05/23/2022 4.6 3.5 - 5.3 mmol/L Final         Passed - Patient is not pregnant      Passed - Last BP in normal range    BP Readings from Last 1 Encounters:  07/21/22 132/78

## 2022-08-09 ENCOUNTER — Other Ambulatory Visit: Payer: Self-pay | Admitting: Family Medicine

## 2022-08-11 ENCOUNTER — Other Ambulatory Visit: Payer: Self-pay | Admitting: Family Medicine

## 2022-08-11 ENCOUNTER — Telehealth: Payer: Self-pay

## 2022-08-11 DIAGNOSIS — I10 Essential (primary) hypertension: Secondary | ICD-10-CM

## 2022-08-11 MED ORDER — METFORMIN HCL 1000 MG PO TABS: 1000.0000 mg | ORAL_TABLET | Freq: Two times a day (BID) | ORAL | 3 refills | Status: DC

## 2022-08-11 MED ORDER — AMLODIPINE BESYLATE 10 MG PO TABS: 10.0000 mg | ORAL_TABLET | Freq: Every day | ORAL | 3 refills | Status: DC

## 2022-08-11 NOTE — Telephone Encounter (Signed)
Pt came into office to find out why these meds:  amLODipine (NORVASC) 10 MG tablet [[id number]] metFORMIN (GLUCOPHAGE) 1000 MG tablet [[id number]]   Are being denied. Pt is running low on these meds. Please advise.  Cb#: [phone]

## 2022-08-11 NOTE — Telephone Encounter (Signed)
Requested Prescriptions  Pending Prescriptions Disp Refills   lisinopril (ZESTRIL) 40 MG tablet [Pharmacy Med Name: LISINOPRIL 40 MG TABLET] 90 tablet 1    Sig: TAKE 1 TABLET BY MOUTH EVERY DAY     Cardiovascular:  ACE Inhibitors Failed - 08/11/2022  9:11 AM      Failed - Valid encounter within last 6 months    Recent Outpatient Visits           1 year ago Need for immunization against influenza   [place] Pickard, Cammie Mcgee, MD   1 year ago Paroxysmal atrial fibrillation ([place])   [place] [facility] Schaumann, Cammie Mcgee, MD   2 years ago DDD (degenerative disc disease), cervical   [facility], Cammie Mcgee, MD   2 years ago Cervical radiculopathy   [place] Susy Frizzle, MD   3 years ago [place] Delsa Grana, PA-C       Future Appointments             In 5 months Pickard, Cammie Mcgee, MD [place]            Passed - Cr in normal range and within 180 days    Creat  Date Value Ref Range Status  05/23/2022 0.96 0.70 - 1.35 mg/dL Final         Passed - K in normal range and within 180 days    Potassium  Date Value Ref Range Status  05/23/2022 4.6 3.5 - 5.3 mmol/L Final         Passed - Patient is not pregnant      Passed - Last BP in normal range    BP Readings from Last 1 Encounters:  07/21/22 132/78

## 2022-08-19 ENCOUNTER — Other Ambulatory Visit: Payer: Self-pay | Admitting: Family Medicine

## 2022-08-19 DIAGNOSIS — E119 Type 2 diabetes mellitus without complications: Secondary | ICD-10-CM

## 2022-08-29 ENCOUNTER — Other Ambulatory Visit: Payer: Self-pay | Admitting: Family Medicine

## 2022-09-12 ENCOUNTER — Other Ambulatory Visit: Payer: Self-pay | Admitting: Family Medicine

## 2022-09-12 DIAGNOSIS — R000 Tachycardia, unspecified: Secondary | ICD-10-CM

## 2022-11-07 ENCOUNTER — Other Ambulatory Visit: Payer: Self-pay | Admitting: Family Medicine

## 2022-11-07 NOTE — Telephone Encounter (Signed)
Prescription Request  11/07/2022  Is this a "Controlled Substance" medicine? No  LOV: 07/21/2022  What is the name of the medication or equipment? lisinopril (ZESTRIL) 40 MG tablet   Have you contacted your pharmacy to request a refill? Yes   Which pharmacy would you like this sent to?  [id number] [id number]: [phone]: [phone]   Patient notified that their request is being sent to the clinical staff for review and that they should receive a response within 2 business days.   [phone]

## 2022-11-08 ENCOUNTER — Other Ambulatory Visit: Payer: Self-pay

## 2022-11-08 ENCOUNTER — Telehealth: Payer: Self-pay | Admitting: Family Medicine

## 2022-11-08 DIAGNOSIS — R000 Tachycardia, unspecified: Secondary | ICD-10-CM

## 2022-11-08 DIAGNOSIS — I10 Essential (primary) hypertension: Secondary | ICD-10-CM

## 2022-11-08 DIAGNOSIS — I480 Paroxysmal atrial fibrillation: Secondary | ICD-10-CM

## 2022-11-08 MED ORDER — LISINOPRIL 40 MG PO TABS: 40.0000 mg | ORAL_TABLET | Freq: Every day | ORAL | 1 refills | Status: DC

## 2022-11-08 NOTE — Telephone Encounter (Signed)
Last RF 08/11/22 #90 1 RF  Requested Prescriptions  Refused Prescriptions Disp Refills   lisinopril (ZESTRIL) 40 MG tablet 90 tablet 1    Sig: Take 1 tablet (40 mg total) by mouth daily.     Cardiovascular:  ACE Inhibitors Failed - 11/07/2022 12:05 PM      Failed - Valid encounter within last 6 months    Recent Outpatient Visits           1 year ago Need for immunization against influenza   [place] Dennard Schaumann, Cammie Mcgee, MD   1 year ago Paroxysmal atrial fibrillation [facility])   [place] Dennard Schaumann, Cammie Mcgee, MD   2 years ago DDD (degenerative disc disease), cervical   [place] Dennard Schaumann, Cammie Mcgee, MD   3 years ago Cervical radiculopathy   [place] Dennard Schaumann, Cammie Mcgee, MD   3 years ago [place] Delsa Grana, PA-C       Future Appointments             In 2 days Susy Frizzle, MD [place], [place]   In 2 months Dennard Schaumann, Cammie Mcgee, MD [facility]            Passed - Cr in normal range and within 180 days    Creat  Date Value Ref Range Status  05/23/2022 0.96 0.70 - 1.35 mg/dL Final         Passed - K in normal range and within 180 days    Potassium  Date Value Ref Range Status  05/23/2022 4.6 3.5 - 5.3 mmol/L Final         Passed - Patient is not pregnant      Passed - Last BP in normal range    BP Readings from Last 1 Encounters:  07/21/22 132/78

## 2022-11-08 NOTE — Telephone Encounter (Signed)
Patient came to office to follow up on refill request for   lisinopril (ZESTRIL) 40 MG tablet [[id number]]   Pharmacy confirmed as  [id number] [place]Phone: [phone] Fax: [phone] #: [id number]  Patient stated insurance requires a prior auth; patient took his last pill this morning.  Please advise patient at [phone]

## 2022-11-10 ENCOUNTER — Encounter: Payer: Self-pay | Admitting: Family Medicine

## 2022-11-10 ENCOUNTER — Ambulatory Visit (INDEPENDENT_AMBULATORY_CARE_PROVIDER_SITE_OTHER): Payer: Medicare HMO | Admitting: Family Medicine

## 2022-11-10 ENCOUNTER — Other Ambulatory Visit: Payer: Self-pay

## 2022-11-10 VITALS — BP 130/72 | HR 61 | Temp 97.7°F | Ht 76.0 in | Wt 264.0 lb

## 2022-11-10 DIAGNOSIS — I480 Paroxysmal atrial fibrillation: Secondary | ICD-10-CM | POA: Diagnosis not present

## 2022-11-10 DIAGNOSIS — I10 Essential (primary) hypertension: Secondary | ICD-10-CM | POA: Diagnosis not present

## 2022-11-10 DIAGNOSIS — R000 Tachycardia, unspecified: Secondary | ICD-10-CM

## 2022-11-10 DIAGNOSIS — Z125 Encounter for screening for malignant neoplasm of prostate: Secondary | ICD-10-CM | POA: Diagnosis not present

## 2022-11-10 DIAGNOSIS — E119 Type 2 diabetes mellitus without complications: Secondary | ICD-10-CM | POA: Diagnosis not present

## 2022-11-10 DIAGNOSIS — R350 Frequency of micturition: Secondary | ICD-10-CM | POA: Diagnosis not present

## 2022-11-10 MED ORDER — LISINOPRIL 40 MG PO TABS: 40.0000 mg | ORAL_TABLET | Freq: Every day | ORAL | 1 refills | Status: DC

## 2022-11-10 NOTE — Progress Notes (Signed)
Subjective:    Patient ID: Todd Ellis, male    DOB: 1956/08/30, 67 y.o.   MRN: [id number]  Last year, the patient has stopped Januvia and [place] due to cost.  We had to replace them with glipizide and Actos.  Although this save the patient money, he has noticed significant weight gain: Wt Readings from Last 3 Encounters:  11/10/22 264 lb (119.7 kg)  07/21/22 257 lb (116.6 kg)  05/23/22 250 lb 12.8 oz (113.8 kg)   His weight is up approximately 14 pounds since we made the switch.  He does not like the way he feels since this happened.  He denies any chest pain or shortness of breath but he does report increased urinary frequency.  He states that he is having to go to the bathroom to urinate 10 times a day.  He states that he feels like he has to urinate suddenly and without warning.  He denies a weak stream.  He denies any dribbling or burning or pain Past Medical History:  Diagnosis Date   Allergy    mild   Arthritis    wrist, shoulder and neck   Atrial fibrillation ([place])    pt reported he though the onset was maybe 3 years ago ( 2017 ) , no blood thinner per pt.   Atrial flutter ([place])    Diabetes mellitus without complication ([facility])    Heart murmur    Hyperlipidemia    Hypertension    Sleep apnea    does not wear cpap   Past Surgical History:  Procedure Laterality Date   CARDIOVERSION N/A 07/26/2016   Procedure: CARDIOVERSION;  Surgeon: Adrian Prows, MD;  Location: [place];  Service: Cardiovascular;  Laterality: N/A;   COLONOSCOPY     POLYPECTOMY     Current Outpatient Medications on File Prior to Visit  Medication Sig Dispense Refill   amLODipine (NORVASC) 10 MG tablet Take 1 tablet (10 mg total) by mouth daily. 90 tablet 3   azithromycin (ZITHROMAX) 250 MG tablet 2 tabs poqday1, 1 tab poqday 2-5 6 tablet 0   Cholecalciferol (VITAMIN D3 PO) Take by mouth.     CINNAMON PO Take 1,000 mg by mouth 2 (two) times daily.      COMIRNATY syringe      Cyanocobalamin  (VITAMIN B 12 PO) Take 1,000 mg by mouth daily.     fluticasone (FLONASE) 50 MCG/ACT nasal spray Place 2 sprays into both nostrils daily.     FLUZONE HIGH-DOSE QUADRIVALENT 0.7 ML SUSY      glipiZIDE (GLUCOTROL) 10 MG tablet TAKE 1 TABLET (10 MG TOTAL) BY MOUTH TWICE A DAY BEFORE A MEAL 180 tablet 1   metFORMIN (GLUCOPHAGE) 1000 MG tablet Take 1 tablet (1,000 mg total) by mouth 2 (two) times daily. 180 tablet 3   metoprolol succinate (TOPROL-XL) 50 MG 24 hr tablet TAKE 1 TABLET BY MOUTH EVERY DAY WITH OR IMMEDIATELY FOLLOWING A MEAL 90 tablet 3   Omega-3 Fatty Acids (FISH OIL) 1200 MG CAPS Take 3 capsules by mouth daily.     pioglitazone (ACTOS) 30 MG tablet Take 1 tablet (30 mg total) by mouth daily. THIS REPLACES JANUVIA 90 tablet 3   rosuvastatin (CRESTOR) 40 MG tablet TAKE 1/2 TABLET BY MOUTH DAILY 45 tablet 3   spironolactone (ALDACTONE) 25 MG tablet TAKE 1 TABLET (25 MG TOTAL) BY MOUTH DAILY. 90 tablet 3   tiZANidine (ZANAFLEX) 4 MG capsule TAKE 1 CAPSULE BY MOUTH 3 TIMES DAILY AS NEEDED  FOR MUSCLE SPASMS. 60 capsule 0   vitamin C (ASCORBIC ACID) 500 MG tablet Take 500 mg by mouth daily.     No current facility-administered medications on file prior to visit.    Allergies  Allergen Reactions   Lipitor [Atorvastatin]     Myalgias   Penicillin G Rash   Penicillins Rash   Social History   Socioeconomic History   Marital status: Married    Spouse name: Not on file   Number of children: 1   Years of education: Not on file   Highest education level: Not on file  Occupational History   Not on file  Tobacco Use   Smoking status: Never   Smokeless tobacco: Former    Types: Chew    Quit date: 11/28/2000   Tobacco comments:    quit 2002  Vaping Use   Vaping Use: Never used  Substance and Sexual Activity   Alcohol use: Yes    Comment: Occasional   Drug use: No   Sexual activity: Not on file  Other Topics Concern   Not on file  Social History Narrative   Not on file    Social Determinants of Health   Financial Resource Strain: Not on file  Food Insecurity: Not on file  Transportation Needs: Not on file  Physical Activity: Not on file  Stress: Not on file  Social Connections: Not on file  Intimate Partner Violence: Not on file      Review of Systems  All other systems reviewed and are negative.      Objective:   Physical Exam Vitals reviewed.  Constitutional:      General: He is not in acute distress.    Appearance: He is well-developed. He is not diaphoretic.  HENT:     Head: Normocephalic and atraumatic.     Right Ear: External ear normal.     Left Ear: External ear normal.     Nose: Nose normal.     Mouth/Throat:     Pharynx: No oropharyngeal exudate.  Eyes:     General: No scleral icterus.       Right eye: No discharge.        Left eye: No discharge.     Conjunctiva/sclera: Conjunctivae normal.     Pupils: Pupils are equal, round, and reactive to light.  Neck:     Thyroid: No thyromegaly.     Vascular: No JVD.     Trachea: No tracheal deviation.  Cardiovascular:     Rate and Rhythm: Normal rate and regular rhythm.     Heart sounds: Murmur heard.     No friction rub. No gallop.  Pulmonary:     Effort: Pulmonary effort is normal. No respiratory distress.     Breath sounds: Normal breath sounds. No stridor. No wheezing or rales.  Abdominal:     General: Bowel sounds are normal. There is no distension.     Palpations: Abdomen is soft. There is no mass.     Tenderness: There is no abdominal tenderness. There is no guarding or rebound.  Musculoskeletal:     Cervical back: Normal range of motion and neck supple.  Lymphadenopathy:     Cervical: No cervical adenopathy.  Skin:    General: Skin is warm.  Neurological:     Mental Status: He is alert and oriented to person, place, and time.     Cranial Nerves: No cranial nerve deficit.     Motor: No abnormal muscle tone.  Coordination: Coordination normal.     Deep Tendon  Reflexes: Reflexes are normal and symmetric.  Psychiatric:        Behavior: Behavior normal.        Thought Content: Thought content normal.        Judgment: Judgment normal.         Assessment & Plan:  Urinary frequency - Plan: Urinalysis, Routine w reflex microscopic  Paroxysmal atrial fibrillation (HCC) - Plan: lisinopril (ZESTRIL) 40 MG tablet, DISCONTINUED: lisinopril (ZESTRIL) 40 MG tablet  Tachycardia - Plan: lisinopril (ZESTRIL) 40 MG tablet, DISCONTINUED: lisinopril (ZESTRIL) 40 MG tablet  Benign essential HTN - Plan: lisinopril (ZESTRIL) 40 MG tablet, DISCONTINUED: lisinopril (ZESTRIL) 40 MG tablet  Controlled type 2 diabetes mellitus without complication, without long-term current use of insulin (HCC) - Plan: Hemoglobin A1c, Lipid panel, COMPLETE METABOLIC PANEL WITH GFR, CBC with Differential/Platelet, Protein / Creatinine Ratio, Urine  Prostate cancer screening - Plan: PSA We will check his A1c.  If his A1c is good we can possibly stop either the glipizide or the Actos.  If his A1c is not good I would recommend switching the glipizide and Actos to [place] and [place].  I believe that much of his symptoms are due to overactive bladder.  Therefore I gave the patient samples of Myrbetriq milligrams daily.  He can uptitrate with samples to 50 mg in 2 weeks.  Reassess in 1 month.  Consider switching to generic oxybutynin for cost reasons if beneficial.  Screen for prostate cancer with a PSA

## 2022-11-11 ENCOUNTER — Telehealth: Payer: Self-pay

## 2022-11-11 LAB — URINALYSIS, ROUTINE W REFLEX MICROSCOPIC
Bilirubin Urine: NEGATIVE
Glucose, UA: NEGATIVE
Hgb urine dipstick: NEGATIVE
Ketones, ur: NEGATIVE
Leukocytes,Ua: NEGATIVE
Nitrite: NEGATIVE
Protein, ur: NEGATIVE
Specific Gravity, Urine: 1.005 (ref 1.001–1.035)
pH: 7 (ref 5.0–8.0)

## 2022-11-11 LAB — COMPLETE METABOLIC PANEL WITH GFR
AG Ratio: 2 (calc) (ref 1.0–2.5)
ALT: 22 U/L (ref 9–46)
AST: 16 U/L (ref 10–35)
Albumin: 4.6 g/dL (ref 3.6–5.1)
Alkaline phosphatase (APISO): 45 U/L (ref 35–144)
BUN: 11 mg/dL (ref 7–25)
CO2: 28 mmol/L (ref 20–32)
Calcium: 9.7 mg/dL (ref 8.6–10.3)
Chloride: 104 mmol/L (ref 98–110)
Creat: 0.81 mg/dL (ref 0.70–1.35)
Globulin: 2.3 g/dL (calc) (ref 1.9–3.7)
Glucose, Bld: 100 mg/dL — ABNORMAL HIGH (ref 65–99)
Potassium: 4.5 mmol/L (ref 3.5–5.3)
Sodium: 142 mmol/L (ref 135–146)
Total Bilirubin: 0.6 mg/dL (ref 0.2–1.2)
Total Protein: 6.9 g/dL (ref 6.1–8.1)
eGFR: 98 mL/min/{1.73_m2} (ref >=60)

## 2022-11-11 LAB — HEMOGLOBIN A1C
Hgb A1c MFr Bld: 7.1 % of total Hgb — ABNORMAL HIGH (ref <5.7)
Mean Plasma Glucose: 157 mg/dL
eAG (mmol/L): 8.7 mmol/L

## 2022-11-11 LAB — LIPID PANEL
Cholesterol: 195 mg/dL (ref <200)
HDL: 52 mg/dL (ref >=40)
LDL Cholesterol (Calc): 122 mg/dL (calc) — ABNORMAL HIGH
Non-HDL Cholesterol (Calc): 143 mg/dL (calc) — ABNORMAL HIGH (ref <130)
Total CHOL/HDL Ratio: 3.8 (calc) (ref <5.0)
Triglycerides: 107 mg/dL (ref <150)

## 2022-11-11 LAB — CBC WITH DIFFERENTIAL/PLATELET
Absolute Monocytes: 519 cells/uL (ref 200–950)
Basophils Absolute: 29 cells/uL (ref 0–200)
Basophils Relative: 0.6 %
Eosinophils Absolute: 59 cells/uL (ref 15–500)
Eosinophils Relative: 1.2 %
HCT: 42.6 % (ref 38.5–50.0)
Hemoglobin: 14.4 g/dL (ref 13.2–17.1)
Lymphs Abs: 1485 cells/uL (ref 850–3900)
MCH: 30.4 pg (ref 27.0–33.0)
MCHC: 33.8 g/dL (ref 32.0–36.0)
MCV: 90.1 fL (ref 80.0–100.0)
MPV: 11 fL (ref 7.5–12.5)
Monocytes Relative: 10.6 %
Neutro Abs: 2808 cells/uL (ref 1500–7800)
Neutrophils Relative %: 57.3 %
Platelets: 210 10*3/uL (ref 140–400)
RBC: 4.73 10*6/uL (ref 4.20–5.80)
RDW: 12.1 % (ref 11.0–15.0)
Total Lymphocyte: 30.3 %
WBC: 4.9 10*3/uL (ref 3.8–10.8)

## 2022-11-11 LAB — PROTEIN / CREATININE RATIO, URINE
Creatinine, Urine: 24 mg/dL (ref 20–320)
Total Protein, Urine: <4 mg/dL — ABNORMAL LOW (ref 5–25)

## 2022-11-11 LAB — PSA: PSA: 1.05 ng/mL (ref <=4.00)

## 2022-11-11 NOTE — Telephone Encounter (Signed)
Pt return [facility] call. Spoke w/pt this afternoon re: lab results w/pcp's recommendations.   Per pt, wants to think about the switching meds to Ozempic? Pt didn't say anything about cholesterol med. Per pt will call next week about medication changes.

## 2023-01-02 ENCOUNTER — Encounter: Payer: Self-pay | Admitting: Family Medicine

## 2023-01-03 ENCOUNTER — Other Ambulatory Visit: Payer: Self-pay | Admitting: Family Medicine

## 2023-01-03 MED ORDER — SEMAGLUTIDE(0.25 OR 0.5MG/DOS) 2 MG/3ML ~~LOC~~ SOPN: 0.5000 mg | PEN_INJECTOR | SUBCUTANEOUS | 1 refills | Status: DC

## 2023-01-12 ENCOUNTER — Encounter: Payer: Self-pay | Admitting: Family Medicine

## 2023-01-20 ENCOUNTER — Ambulatory Visit: Payer: Medicare HMO | Admitting: Family Medicine

## 2023-01-24 ENCOUNTER — Ambulatory Visit (INDEPENDENT_AMBULATORY_CARE_PROVIDER_SITE_OTHER): Payer: Medicare HMO | Admitting: Family Medicine

## 2023-01-24 ENCOUNTER — Encounter: Payer: Self-pay | Admitting: Family Medicine

## 2023-01-24 VITALS — BP 130/84 | HR 64 | Temp 97.7°F | Ht 76.0 in | Wt 271.2 lb

## 2023-01-24 DIAGNOSIS — E119 Type 2 diabetes mellitus without complications: Secondary | ICD-10-CM | POA: Diagnosis not present

## 2023-01-24 DIAGNOSIS — H259 Unspecified age-related cataract: Secondary | ICD-10-CM

## 2023-01-24 MED ORDER — LEVOCETIRIZINE DIHYDROCHLORIDE 5 MG PO TABS: 5.0000 mg | ORAL_TABLET | Freq: Every evening | ORAL | 11 refills | Status: DC

## 2023-01-24 MED ORDER — SEMAGLUTIDE (1 MG/DOSE) 4 MG/3ML ~~LOC~~ SOPN: 1.0000 mg | PEN_INJECTOR | SUBCUTANEOUS | 2 refills | Status: DC

## 2023-01-24 NOTE — Progress Notes (Signed)
Subjective:    Patient ID: Todd Ellis, male    DOB: Mar 13, 1956, 67 y.o.   MRN: [id number]  Last HgA1c was 7.1 on 2/8.  I wanted to replace glipizide with ozempic to try to help with weight loss.   Wt Readings from Last 3 Encounters:  01/24/23 271 lb 3.2 oz (123 kg)  11/10/22 264 lb (119.7 kg)  07/21/22 257 lb (116.6 kg)   Patient is currently on Ozempic 0.5 mg subcu weekly.  He is actually gained weight since I last saw him.  He did not take his.  He took 4 small.  There has been concern days since congestion.  He reports sinus pressure, postnasal drip, runny nose, sinus headache.  He denies any fever or chills.  He is taking Flonase. Past Medical History:  Diagnosis Date   Allergy    mild   Arthritis    wrist, shoulder and neck   Atrial fibrillation    pt reported he though the onset was maybe 3 years ago ( 2017 ) , no blood thinner per pt.   Atrial flutter    Diabetes mellitus without complication    Heart murmur    Hyperlipidemia    Hypertension    Sleep apnea    does not wear cpap   Past Surgical History:  Procedure Laterality Date   CARDIOVERSION N/A 07/26/2016   Procedure: CARDIOVERSION;  Surgeon: Yates Decamp, MD;  Location: [facility] ENDOSCOPY;  Service: Cardiovascular;  Laterality: N/A;   COLONOSCOPY     POLYPECTOMY     Current Outpatient Medications on File Prior to Visit  Medication Sig Dispense Refill   amLODipine (NORVASC) 10 MG tablet Take 1 tablet (10 mg total) by mouth daily. 90 tablet 3   azithromycin (ZITHROMAX) 250 MG tablet 2 tabs poqday1, 1 tab poqday 2-5 6 tablet 0   Cholecalciferol (VITAMIN D3 PO) Take by mouth.     CINNAMON PO Take 1,000 mg by mouth 2 (two) times daily.      COMIRNATY syringe      Cyanocobalamin (VITAMIN B 12 PO) Take 1,000 mg by mouth daily.     fluticasone (FLONASE) 50 MCG/ACT nasal spray Place 2 sprays into both nostrils daily.     FLUZONE HIGH-DOSE QUADRIVALENT 0.7 ML SUSY      glipiZIDE (GLUCOTROL) 10 MG tablet TAKE 1 TABLET  (10 MG TOTAL) BY MOUTH TWICE A DAY BEFORE A MEAL 180 tablet 1   lisinopril (ZESTRIL) 40 MG tablet Take 1 tablet (40 mg total) by mouth daily. 90 tablet 1   metFORMIN (GLUCOPHAGE) 1000 MG tablet Take 1 tablet (1,000 mg total) by mouth 2 (two) times daily. 180 tablet 3   metoprolol succinate (TOPROL-XL) 50 MG 24 hr tablet TAKE 1 TABLET BY MOUTH EVERY DAY WITH OR IMMEDIATELY FOLLOWING A MEAL 90 tablet 3   Omega-3 Fatty Acids (FISH OIL) 1200 MG CAPS Take 3 capsules by mouth daily.     pioglitazone (ACTOS) 30 MG tablet Take 1 tablet (30 mg total) by mouth daily. THIS REPLACES JANUVIA 90 tablet 3   rosuvastatin (CRESTOR) 40 MG tablet TAKE 1/2 TABLET BY MOUTH DAILY 45 tablet 3   Semaglutide,0.25 or 0.5MG /DOS, 2 MG/3ML SOPN Inject 0.5 mg into the skin once a week. Increase dose monthly if you tolerate it 3 mL 1   spironolactone (ALDACTONE) 25 MG tablet TAKE 1 TABLET (25 MG TOTAL) BY MOUTH DAILY. 90 tablet 3   tiZANidine (ZANAFLEX) 4 MG capsule TAKE 1 CAPSULE BY MOUTH 3  TIMES DAILY AS NEEDED FOR MUSCLE SPASMS. 60 capsule 0   vitamin C (ASCORBIC ACID) 500 MG tablet Take 500 mg by mouth daily.     No current facility-administered medications on file prior to visit.    Allergies  Allergen Reactions   Lipitor [Atorvastatin]     Myalgias   Penicillin G Rash   Penicillins Rash   Social History   Socioeconomic History   Marital status: Married    Spouse name: Not on file   Number of children: 1   Years of education: Not on file   Highest education level: 12th grade  Occupational History   Not on file  Tobacco Use   Smoking status: Never   Smokeless tobacco: Former    Types: Chew    Quit date: 11/28/2000   Tobacco comments:    quit 2002  Vaping Use   Vaping Use: Never used  Substance and Sexual Activity   Alcohol use: Yes    Comment: Occasional   Drug use: No   Sexual activity: Not on file  Other Topics Concern   Not on file  Social History Narrative   Not on file   Social  Determinants of Health   Financial Resource Strain: Low Risk  (01/21/2023)   Overall Financial Resource Strain (CARDIA)    Difficulty of Paying Living Expenses: Not hard at all  Food Insecurity: No Food Insecurity (01/21/2023)   Hunger Vital Sign    Worried About Running Out of Food in the Last Year: Never true    Ran Out of Food in the Last Year: Never true  Transportation Needs: No Transportation Needs (01/21/2023)   PRAPARE - Administrator, Civil Service (Medical): No    Lack of Transportation (Non-Medical): No  Physical Activity: Not on file  Stress: Not on file  Social Connections: Unknown (01/21/2023)   Social Connection and Isolation Panel [NHANES]    Frequency of Communication with Friends and Family: Once a week    Frequency of Social Gatherings with Friends and Family: Patient declined    Attends Religious Services: Patient declined    Database administrator or Organizations: Patient declined    Attends Banker Meetings: Not on file    Marital Status: Patient declined  Intimate Partner Violence: Not on file      Review of Systems  All other systems reviewed and are negative.      Objective:   Physical Exam Vitals reviewed.  Constitutional:      General: He is not in acute distress.    Appearance: He is well-developed. He is not diaphoretic.  HENT:     Head: Normocephalic and atraumatic.     Right Ear: External ear normal.     Left Ear: External ear normal.     Nose: Nose normal.     Mouth/Throat:     Pharynx: No oropharyngeal exudate.  Eyes:     General: No scleral icterus.       Right eye: No discharge.        Left eye: No discharge.     Conjunctiva/sclera: Conjunctivae normal.     Pupils: Pupils are equal, round, and reactive to light.  Neck:     Thyroid: No thyromegaly.     Vascular: No JVD.     Trachea: No tracheal deviation.  Cardiovascular:     Rate and Rhythm: Normal rate and regular rhythm.     Heart sounds: Murmur  heard.  No friction rub. No gallop.  Pulmonary:     Effort: Pulmonary effort is normal. No respiratory distress.     Breath sounds: Normal breath sounds. No stridor. No wheezing or rales.  Abdominal:     General: Bowel sounds are normal. There is no distension.     Palpations: Abdomen is soft. There is no mass.     Tenderness: There is no abdominal tenderness. There is no guarding or rebound.  Musculoskeletal:     Cervical back: Normal range of motion and neck supple.  Lymphadenopathy:     Cervical: No cervical adenopathy.  Skin:    General: Skin is warm.  Neurological:     Mental Status: He is alert and oriented to person, place, and time.     Cranial Nerves: No cranial nerve deficit.     Motor: No abnormal muscle tone.     Coordination: Coordination normal.     Deep Tendon Reflexes: Reflexes are normal and symmetric.  Psychiatric:        Behavior: Behavior normal.        Thought Content: Thought content normal.        Judgment: Judgment normal.         Assessment & Plan:  Senile cataract, unspecified age-related cataract type, unspecified laterality - Plan: Ambulatory referral to Ophthalmology  Controlled type 2 diabetes mellitus without complication, without long-term current use of insulin Patient's exam today is unremarkable.  I recommended trying Xyzal 5 mg daily for allergies.  I recommended increasing his Ozempic to 1 mg subcu weekly and then uptitrating to 2 mg in 1 month if tolerable.  Recheck hemoglobin A1c 3 months after that.  I will consult ophthalmology as the patient has a cataract and is having blurry vision.  He would like to have this corrected

## 2023-02-18 ENCOUNTER — Other Ambulatory Visit: Payer: Self-pay | Admitting: Family Medicine

## 2023-02-18 DIAGNOSIS — E119 Type 2 diabetes mellitus without complications: Secondary | ICD-10-CM

## 2023-02-20 NOTE — Telephone Encounter (Signed)
Requested Prescriptions  Refused Prescriptions Disp Refills   glipiZIDE (GLUCOTROL) 10 MG tablet [Pharmacy Med Name: GLIPIZIDE 10 MG TABLET] 180 tablet 1    Sig: TAKE 1 TABLET BY MOUTH TWICE A DAY BEFORE A MEAL     Endocrinology:  Diabetes - Sulfonylureas Failed - 02/18/2023  8:59 AM      Failed - Valid encounter within last 6 months    Recent Outpatient Visits           1 year ago Need for immunization against influenza   [facility] Donita Brooks, MD   2 years ago Paroxysmal atrial fibrillation [facility])   [facility] Donita Brooks, MD   2 years ago DDD (degenerative disc disease), cervical   [facility] Donita Brooks, MD   3 years ago Cervical radiculopathy   [facility] Donita Brooks, MD   3 years ago Rhinosinusitis   [facility] Danelle Berry, PA-C              Passed - HBA1C is between 0 and 7.9 and within 180 days    Hgb A1c MFr Bld  Date Value Ref Range Status  11/10/2022 7.1 (H) <5.7 % of total Hgb Final    Comment:    For someone without known diabetes, a hemoglobin A1c value of 6.5% or greater indicates that they may have  diabetes and this should be confirmed with a follow-up  test. . For someone with known diabetes, a value <7% indicates  that their diabetes is well controlled and a value  greater than or equal to 7% indicates suboptimal  control. A1c targets should be individualized based on  duration of diabetes, age, comorbid conditions, and  other considerations. . Currently, no consensus exists regarding use of hemoglobin A1c for diagnosis of diabetes for children. .          Passed - Cr in normal range and within 360 days    Creat  Date Value Ref Range Status  11/10/2022 0.81 0.70 - 1.35 mg/dL Final   Creatinine, Urine  Date Value Ref Range Status  11/10/2022 24 20 - 320 mg/dL Final

## 2023-02-28 ENCOUNTER — Encounter: Payer: Self-pay | Admitting: Family Medicine

## 2023-02-28 ENCOUNTER — Other Ambulatory Visit: Payer: Self-pay

## 2023-02-28 DIAGNOSIS — E119 Type 2 diabetes mellitus without complications: Secondary | ICD-10-CM

## 2023-02-28 MED ORDER — SEMAGLUTIDE (2 MG/DOSE) 8 MG/3ML ~~LOC~~ SOPN: 2.0000 mg | PEN_INJECTOR | SUBCUTANEOUS | 1 refills | Status: DC

## 2023-03-15 ENCOUNTER — Encounter: Payer: Self-pay | Admitting: Family Medicine

## 2023-03-16 ENCOUNTER — Other Ambulatory Visit: Payer: Self-pay | Admitting: Family Medicine

## 2023-03-16 MED ORDER — SULFAMETHOXAZOLE-TRIMETHOPRIM 800-160 MG PO TABS: 1.0000 | ORAL_TABLET | Freq: Two times a day (BID) | ORAL | 0 refills | Status: DC

## 2023-04-22 ENCOUNTER — Other Ambulatory Visit: Payer: Self-pay | Admitting: Family Medicine

## 2023-04-22 DIAGNOSIS — E119 Type 2 diabetes mellitus without complications: Secondary | ICD-10-CM

## 2023-04-24 NOTE — Telephone Encounter (Signed)
Requested Prescriptions  Pending Prescriptions Disp Refills   Semaglutide, 2 MG/DOSE, (OZEMPIC, 2 MG/DOSE,) 8 MG/3ML SOPN [Pharmacy Med Name: OZEMPIC 8 MG/3 ML (2 MG/DOSE)] 3 mL 0    Sig: INJECT 2 MG AS DIRECTED ONCE A WEEK.     Endocrinology:  Diabetes - GLP-1 Receptor Agonists - semaglutide Failed - 04/22/2023  9:25 AM      Failed - HBA1C in normal range and within 180 days    Hgb A1c MFr Bld  Date Value Ref Range Status  11/10/2022 7.1 (H) <5.7 % of total Hgb Final    Comment:    For someone without known diabetes, a hemoglobin A1c value of 6.5% or greater indicates that they may have  diabetes and this should be confirmed with a follow-up  test. . For someone with known diabetes, a value <7% indicates  that their diabetes is well controlled and a value  greater than or equal to 7% indicates suboptimal  control. A1c targets should be individualized based on  duration of diabetes, age, comorbid conditions, and  other considerations. . Currently, no consensus exists regarding use of hemoglobin A1c for diagnosis of diabetes for children. .          Failed - Valid encounter within last 6 months    Recent Outpatient Visits           1 year ago Need for immunization against influenza   [facility] Donita Brooks, MD   2 years ago Paroxysmal atrial fibrillation [facility])   [facility] Tanya Nones, Priscille Heidelberg, MD   3 years ago DDD (degenerative disc disease), cervical   [facility] Pickard, Priscille Heidelberg, MD   3 years ago Cervical radiculopathy   [facility] Donita Brooks, MD   4 years ago Rhinosinusitis   [facility] Danelle Berry, PA-C              Passed - Cr in normal range and within 360 days    Creat  Date Value Ref Range Status  11/10/2022 0.81 0.70 - 1.35 mg/dL Final   Creatinine, Urine  Date Value Ref Range Status  11/10/2022 24 20 - 320 mg/dL Final

## 2023-04-26 ENCOUNTER — Other Ambulatory Visit: Payer: Medicare HMO

## 2023-04-26 DIAGNOSIS — I10 Essential (primary) hypertension: Secondary | ICD-10-CM

## 2023-04-26 DIAGNOSIS — E7800 Pure hypercholesterolemia, unspecified: Secondary | ICD-10-CM

## 2023-04-26 DIAGNOSIS — E119 Type 2 diabetes mellitus without complications: Secondary | ICD-10-CM

## 2023-04-26 LAB — CBC WITH DIFFERENTIAL/PLATELET
Basophils Absolute: 17 cells/uL (ref 0–200)
Eosinophils Absolute: 93 cells/uL (ref 15–500)
HCT: 42.2 % (ref 38.5–50.0)
Hemoglobin: 13.9 g/dL (ref 13.2–17.1)
MCH: 29.6 pg (ref 27.0–33.0)
Neutrophils Relative %: 61.5 %
Platelets: 205 10*3/uL (ref 140–400)
RDW: 11.8 % (ref 11.0–15.0)
Total Lymphocyte: 28.6 %

## 2023-04-27 LAB — CBC WITH DIFFERENTIAL/PLATELET
Absolute Monocytes: 464 cells/uL (ref 200–950)
Basophils Relative: 0.3 %
Eosinophils Relative: 1.6 %
Lymphs Abs: 1659 cells/uL (ref 850–3900)
MCHC: 32.9 g/dL (ref 32.0–36.0)
MCV: 89.8 fL (ref 80.0–100.0)
MPV: 10.8 fL (ref 7.5–12.5)
Monocytes Relative: 8 %
Neutro Abs: 3567 cells/uL (ref 1500–7800)
RBC: 4.7 10*6/uL (ref 4.20–5.80)
WBC: 5.8 10*3/uL (ref 3.8–10.8)

## 2023-04-27 LAB — LIPID PANEL
Cholesterol: 139 mg/dL (ref <200)
HDL: 44 mg/dL (ref >=40)
LDL Cholesterol (Calc): 75 mg/dL (calc)
Non-HDL Cholesterol (Calc): 95 mg/dL (calc) (ref <130)
Total CHOL/HDL Ratio: 3.2 (calc) (ref <5.0)
Triglycerides: 112 mg/dL (ref <150)

## 2023-04-27 LAB — COMPLETE METABOLIC PANEL WITH GFR
ALT: 32 U/L (ref 9–46)
Alkaline phosphatase (APISO): 53 U/L (ref 35–144)
BUN: 11 mg/dL (ref 7–25)
CO2: 27 mmol/L (ref 20–32)
Calcium: 9.5 mg/dL (ref 8.6–10.3)
Chloride: 102 mmol/L (ref 98–110)
Creat: 0.8 mg/dL (ref 0.70–1.35)
Glucose, Bld: 166 mg/dL — ABNORMAL HIGH (ref 65–99)
Potassium: 4.4 mmol/L (ref 3.5–5.3)
Total Protein: 6.3 g/dL (ref 6.1–8.1)
eGFR: 98 mL/min/{1.73_m2} (ref >=60)

## 2023-04-27 LAB — HEMOGLOBIN A1C
Hgb A1c MFr Bld: 8.3 % of total Hgb — ABNORMAL HIGH (ref <5.7)
Mean Plasma Glucose: 192 mg/dL
eAG (mmol/L): 10.6 mmol/L

## 2023-04-27 LAB — VITAMIN B12: Vitamin B-12: 818 pg/mL (ref 200–1100)

## 2023-05-02 ENCOUNTER — Encounter: Payer: Self-pay | Admitting: Family Medicine

## 2023-05-02 ENCOUNTER — Ambulatory Visit (INDEPENDENT_AMBULATORY_CARE_PROVIDER_SITE_OTHER): Payer: Medicare HMO | Admitting: Family Medicine

## 2023-05-02 VITALS — BP 122/68 | HR 95 | Temp 98.7°F | Ht 76.0 in | Wt 258.0 lb

## 2023-05-02 DIAGNOSIS — Z7985 Long-term (current) use of injectable non-insulin antidiabetic drugs: Secondary | ICD-10-CM | POA: Diagnosis not present

## 2023-05-02 DIAGNOSIS — E119 Type 2 diabetes mellitus without complications: Secondary | ICD-10-CM | POA: Diagnosis not present

## 2023-05-02 NOTE — Progress Notes (Signed)
Subjective:    Patient ID: Todd Ellis, male    DOB: May 20, 1956, 67 y.o.   MRN: [id number]  Wt Readings from Last 3 Encounters:  05/02/23 258 lb (117 kg)  01/24/23 271 lb 3.2 oz (123 kg)  11/10/22 264 lb (119.7 kg)   Has lost 13 lbs.  Currently on 2 mg of ozempic weekly.  Despite losing weight HgA1c is up from 7.1 to 8.3. Lab on 04/26/2023  Component Date Value Ref Range Status   WBC 04/26/2023 5.8  3.8 - 10.8 Thousand/uL Final   RBC 04/26/2023 4.70  4.20 - 5.80 Million/uL Final   Hemoglobin 04/26/2023 13.9  13.2 - 17.1 g/dL Final   HCT 95/62/1308 42.2  38.5 - 50.0 % Final   MCV 04/26/2023 89.8  80.0 - 100.0 fL Final   MCH 04/26/2023 29.6  27.0 - 33.0 pg Final   MCHC 04/26/2023 32.9  32.0 - 36.0 g/dL Final   RDW 65/78/4696 11.8  11.0 - 15.0 % Final   Platelets 04/26/2023 205  140 - 400 Thousand/uL Final   MPV 04/26/2023 10.8  7.5 - 12.5 fL Final   Neutro Abs 04/26/2023 3,567  1,500 - 7,800 cells/uL Final   Lymphs Abs 04/26/2023 1,659  850 - 3,900 cells/uL Final   Absolute Monocytes 04/26/2023 464  200 - 950 cells/uL Final   Eosinophils Absolute 04/26/2023 93  15 - 500 cells/uL Final   Basophils Absolute 04/26/2023 17  0 - 200 cells/uL Final   Neutrophils Relative % 04/26/2023 61.5  % Final   Total Lymphocyte 04/26/2023 28.6  % Final   Monocytes Relative 04/26/2023 8.0  % Final   Eosinophils Relative 04/26/2023 1.6  % Final   Basophils Relative 04/26/2023 0.3  % Final   Glucose, Bld 04/26/2023 166 (H)  65 - 99 mg/dL Final   Comment: .            Fasting reference interval . For someone without known diabetes, a glucose value >125 mg/dL indicates that they may have diabetes and this should be confirmed with a follow-up test. .    BUN 04/26/2023 11  7 - 25 mg/dL Final   Creat 29/52/8413 0.80  0.70 - 1.35 mg/dL Final   eGFR 24/40/1027 98  > OR = 60 mL/min/1.91m2 Final   BUN/Creatinine Ratio 04/26/2023 SEE NOTE:  6 - 22 (calc) Final   Comment:    Not Reported: BUN  and Creatinine are within    reference range. .    Sodium 04/26/2023 139  135 - 146 mmol/L Final   Potassium 04/26/2023 4.4  3.5 - 5.3 mmol/L Final   Chloride 04/26/2023 102  98 - 110 mmol/L Final   CO2 04/26/2023 27  20 - 32 mmol/L Final   Calcium 04/26/2023 9.5  8.6 - 10.3 mg/dL Final   Total Protein 25/36/6440 6.3  6.1 - 8.1 g/dL Final   Albumin 34/74/2595 4.3  3.6 - 5.1 g/dL Final   Globulin 63/87/5643 2.0  1.9 - 3.7 g/dL (calc) Final   AG Ratio 04/26/2023 2.2  1.0 - 2.5 (calc) Final   Total Bilirubin 04/26/2023 0.4  0.2 - 1.2 mg/dL Final   Alkaline phosphatase (APISO) 04/26/2023 53  35 - 144 U/L Final   AST 04/26/2023 17  10 - 35 U/L Final   ALT 04/26/2023 32  9 - 46 U/L Final   Hgb A1c MFr Bld 04/26/2023 8.3 (H)  <5.7 % of total Hgb Final   Comment: For someone without known diabetes,  a hemoglobin A1c value of 6.5% or greater indicates that they may have  diabetes and this should be confirmed with a follow-up  test. . For someone with known diabetes, a value <7% indicates  that their diabetes is well controlled and a value  greater than or equal to 7% indicates suboptimal  control. A1c targets should be individualized based on  duration of diabetes, age, comorbid conditions, and  other considerations. . Currently, no consensus exists regarding use of hemoglobin A1c for diagnosis of diabetes for children. .    Mean Plasma Glucose 04/26/2023 192  mg/dL Final   eAG (mmol/L) 16/07/9603 10.6  mmol/L Final   Comment: . This test was performed on the Roche cobas c503 platform. Effective 07/11/22, a change in test platforms from the Abbott Architect to the Roche cobas c503 may have shifted HbA1c results compared to historical results. Based on laboratory validation testing conducted at [facility], the Roche platform relative to the Abbott platform had an average increase in HbA1c value of < or = 0.3%. This difference is within accepted  variability established by the [facility]. Note that not all individuals will have had a shift in their results and direct comparisons between historical and current results for testing conducted on different platforms is not recommended.    Cholesterol 04/26/2023 139  <200 mg/dL Final   HDL 54/06/8118 44  > OR = 40 mg/dL Final   Triglycerides 14/78/2956 112  <150 mg/dL Final   LDL Cholesterol (Calc) 04/26/2023 75  mg/dL (calc) Final   Comment: Reference range: <100 . Desirable range <100 mg/dL for primary prevention;   <70 mg/dL for patients with CHD or diabetic patients  with > or = 2 CHD risk factors. Marland Kitchen LDL-C is now calculated using the Martin-Hopkins  calculation, which is a validated novel method providing  better accuracy than the Friedewald equation in the  estimation of LDL-C.  Horald Pollen et al. Lenox Ahr. 2130;865(78): 2061-2068  (http://education.[facility].com/faq/FAQ164)    Total CHOL/HDL Ratio 04/26/2023 3.2  <4.6 (calc) Final   Non-HDL Cholesterol (Calc) 04/26/2023 95  <130 mg/dL (calc) Final   Comment: For patients with diabetes plus 1 major ASCVD risk  factor, treating to a non-HDL-C goal of <100 mg/dL  (LDL-C of <96 mg/dL) is considered a therapeutic  option.    Vitamin B-12 04/26/2023 818  200 - 1,100 pg/mL Final   Patient states that he has been on the high-dose Ozempic for the last 3 months.  Initially it made him very nauseated.  However he is now adjusting to it and feeling better.  He wants to continue the medication.  However he admits that he has become very inactive.  He is not doing the physical activity he once was doing and as result his sugars have risen.  He denies any chest pain shortness of breath or dyspnea on exertion. Past Medical History:  Diagnosis Date   Allergy    mild   Arthritis    wrist, shoulder and neck   Atrial fibrillation (HCC)    pt reported he though the onset was maybe 3 years ago ( 2017 ) , no blood thinner per pt.   Atrial  flutter (HCC)    Diabetes mellitus without complication (HCC)    Heart murmur    Hyperlipidemia    Hypertension    Sleep apnea    does not wear cpap   Past Surgical History:  Procedure Laterality Date   CARDIOVERSION N/A 07/26/2016   Procedure:  CARDIOVERSION;  Surgeon: Yates Decamp, MD;  Location: [facility] ENDOSCOPY;  Service: Cardiovascular;  Laterality: N/A;   COLONOSCOPY     POLYPECTOMY     Current Outpatient Medications on File Prior to Visit  Medication Sig Dispense Refill   amLODipine (NORVASC) 10 MG tablet Take 1 tablet (10 mg total) by mouth daily. 90 tablet 3   Cholecalciferol (VITAMIN D3 PO) Take by mouth.     CINNAMON PO Take 1,000 mg by mouth 2 (two) times daily.      Cyanocobalamin (VITAMIN B 12 PO) Take 1,000 mg by mouth daily.     fluticasone (FLONASE) 50 MCG/ACT nasal spray Place 2 sprays into both nostrils daily.     levocetirizine (XYZAL ALLERGY 24HR) 5 MG tablet Take 1 tablet (5 mg total) by mouth every evening. 30 tablet 11   lisinopril (ZESTRIL) 40 MG tablet Take 1 tablet (40 mg total) by mouth daily. 90 tablet 1   metFORMIN (GLUCOPHAGE) 1000 MG tablet Take 1 tablet (1,000 mg total) by mouth 2 (two) times daily. 180 tablet 3   metoprolol succinate (TOPROL-XL) 50 MG 24 hr tablet TAKE 1 TABLET BY MOUTH EVERY DAY WITH OR IMMEDIATELY FOLLOWING A MEAL 90 tablet 3   Omega-3 Fatty Acids (FISH OIL) 1200 MG CAPS Take 3 capsules by mouth daily.     pioglitazone (ACTOS) 30 MG tablet Take 1 tablet (30 mg total) by mouth daily. THIS REPLACES JANUVIA 90 tablet 3   rosuvastatin (CRESTOR) 40 MG tablet TAKE 1/2 TABLET BY MOUTH DAILY 45 tablet 3   Semaglutide, 2 MG/DOSE, (OZEMPIC, 2 MG/DOSE,) 8 MG/3ML SOPN INJECT 2 MG AS DIRECTED ONCE A WEEK. 3 mL 0   spironolactone (ALDACTONE) 25 MG tablet TAKE 1 TABLET (25 MG TOTAL) BY MOUTH DAILY. 90 tablet 3   sulfamethoxazole-trimethoprim (BACTRIM DS) 800-160 MG tablet Take 1 tablet by mouth 2 (two) times daily. 14 tablet 0   tiZANidine (ZANAFLEX) 4 MG  capsule TAKE 1 CAPSULE BY MOUTH 3 TIMES DAILY AS NEEDED FOR MUSCLE SPASMS. 60 capsule 0   vitamin C (ASCORBIC ACID) 500 MG tablet Take 500 mg by mouth daily.     No current facility-administered medications on file prior to visit.    Allergies  Allergen Reactions   Lipitor [Atorvastatin]     Myalgias   Penicillin G Rash   Penicillins Rash   Social History   Socioeconomic History   Marital status: Married    Spouse name: Not on file   Number of children: 1   Years of education: Not on file   Highest education level: 12th grade  Occupational History   Not on file  Tobacco Use   Smoking status: Never   Smokeless tobacco: Former    Types: Chew    Quit date: 11/28/2000   Tobacco comments:    quit 2002  Vaping Use   Vaping status: Never Used  Substance and Sexual Activity   Alcohol use: Yes    Comment: Occasional   Drug use: No   Sexual activity: Not on file  Other Topics Concern   Not on file  Social History Narrative   Not on file   Social Determinants of Health   Financial Resource Strain: Low Risk  (01/21/2023)   Overall Financial Resource Strain (CARDIA)    Difficulty of Paying Living Expenses: Not hard at all  Food Insecurity: No Food Insecurity (01/21/2023)   Hunger Vital Sign    Worried About Running Out of Food in the Last Year: Never true  Ran Out of Food in the Last Year: Never true  Transportation Needs: No Transportation Needs (01/21/2023)   PRAPARE - Administrator, Civil Service (Medical): No    Lack of Transportation (Non-Medical): No  Physical Activity: Not on file  Stress: Not on file  Social Connections: Unknown (01/21/2023)   Social Connection and Isolation Panel [NHANES]    Frequency of Communication with Friends and Family: Once a week    Frequency of Social Gatherings with Friends and Family: Patient declined    Attends Religious Services: Patient declined    Database administrator or Organizations: Patient declined    Attends  Banker Meetings: Not on file    Marital Status: Patient declined  Intimate Partner Violence: Not on file      Review of Systems  All other systems reviewed and are negative.      Objective:   Physical Exam Vitals reviewed.  Constitutional:      General: He is not in acute distress.    Appearance: He is well-developed. He is not diaphoretic.  HENT:     Head: Normocephalic and atraumatic.     Right Ear: External ear normal.     Left Ear: External ear normal.     Nose: Nose normal.     Mouth/Throat:     Pharynx: No oropharyngeal exudate.  Eyes:     General: No scleral icterus.       Right eye: No discharge.        Left eye: No discharge.     Conjunctiva/sclera: Conjunctivae normal.     Pupils: Pupils are equal, round, and reactive to light.  Neck:     Thyroid: No thyromegaly.     Vascular: No JVD.     Trachea: No tracheal deviation.  Cardiovascular:     Rate and Rhythm: Normal rate and regular rhythm.     Heart sounds: Murmur heard.     No friction rub. No gallop.  Pulmonary:     Effort: Pulmonary effort is normal. No respiratory distress.     Breath sounds: Normal breath sounds. No stridor. No wheezing or rales.  Abdominal:     General: Bowel sounds are normal. There is no distension.     Palpations: Abdomen is soft. There is no mass.     Tenderness: There is no abdominal tenderness. There is no guarding or rebound.  Musculoskeletal:     Cervical back: Normal range of motion and neck supple.  Lymphadenopathy:     Cervical: No cervical adenopathy.  Skin:    General: Skin is warm.  Neurological:     Mental Status: He is alert and oriented to person, place, and time.     Cranial Nerves: No cranial nerve deficit.     Motor: No abnormal muscle tone.     Coordination: Coordination normal.     Deep Tendon Reflexes: Reflexes are normal and symmetric.  Psychiatric:        Behavior: Behavior normal.        Thought Content: Thought content normal.         Judgment: Judgment normal.         Assessment & Plan:  Controlled type 2 diabetes mellitus without complication, without long-term current use of insulin (HCC) Patient has 3 options.  He could stop Ozempic and add insulin, he could add Jardiance to the Ozempic, or he can start getting 30 minutes to 1 hour a day of aerobic exercise.  Left surgical  3 months.  If his sugars are not better at that point he would either choose between adding insulin or adding Jardiance.  We discussed this at length.  Recheck lab work in December

## 2023-05-12 ENCOUNTER — Other Ambulatory Visit: Payer: Self-pay | Admitting: Family Medicine

## 2023-05-12 DIAGNOSIS — I10 Essential (primary) hypertension: Secondary | ICD-10-CM

## 2023-05-15 NOTE — Telephone Encounter (Signed)
Requested Prescriptions  Pending Prescriptions Disp Refills   amLODipine (NORVASC) 10 MG tablet [Pharmacy Med Name: AMLODIPINE BESYLATE 10 MG TAB] 90 tablet 1    Sig: TAKE 1 TABLET BY MOUTH EVERY DAY     Cardiovascular: Calcium Channel Blockers 2 Failed - 05/12/2023  3:37 PM      Failed - Valid encounter within last 6 months    Recent Outpatient Visits           1 year ago Need for immunization against influenza   [facility] Donita Brooks, MD   2 years ago Paroxysmal atrial fibrillation [facility])   [facility] Tanya Nones, Priscille Heidelberg, MD   3 years ago DDD (degenerative disc disease), cervical   [facility] Pickard, Priscille Heidelberg, MD   3 years ago Cervical radiculopathy   [facility] Tanya Nones, Priscille Heidelberg, MD   4 years ago Rhinosinusitis   [facility] Danelle Berry, PA-C              Passed - Last BP in normal range    BP Readings from Last 1 Encounters:  05/02/23 122/68         Passed - Last Heart Rate in normal range    Pulse Readings from Last 1 Encounters:  05/02/23 95

## 2023-05-19 NOTE — Telephone Encounter (Signed)
Prescription Request  05/19/2023  LOV: 05/02/2023  What is the name of the medication or equipment?   lisinopril (ZESTRIL) 40 MG tablet   amLODipine (NORVASC) 10 MG tablet [[id number]]  **90 day scripts requested for both meds**   Have you contacted your pharmacy to request a refill? Yes   Which pharmacy would you like this sent to?  [id number] [place], [place] - [address] [place] [address] Phone: [phone] Fax: [phone]    Patient notified that their request is being sent to the clinical staff for review and that they should receive a response within 2 business days.   Please advise pharmacist

## 2023-05-23 ENCOUNTER — Other Ambulatory Visit: Payer: Self-pay | Admitting: Family Medicine

## 2023-05-23 ENCOUNTER — Encounter: Payer: Self-pay | Admitting: Family Medicine

## 2023-05-23 DIAGNOSIS — E119 Type 2 diabetes mellitus without complications: Secondary | ICD-10-CM

## 2023-05-23 DIAGNOSIS — R000 Tachycardia, unspecified: Secondary | ICD-10-CM

## 2023-05-23 DIAGNOSIS — I10 Essential (primary) hypertension: Secondary | ICD-10-CM

## 2023-05-23 DIAGNOSIS — I480 Paroxysmal atrial fibrillation: Secondary | ICD-10-CM

## 2023-05-24 ENCOUNTER — Other Ambulatory Visit: Payer: Self-pay

## 2023-05-24 DIAGNOSIS — E119 Type 2 diabetes mellitus without complications: Secondary | ICD-10-CM

## 2023-05-24 MED ORDER — OZEMPIC (2 MG/DOSE) 8 MG/3ML ~~LOC~~ SOPN
2.0000 mg | PEN_INJECTOR | SUBCUTANEOUS | 3 refills | Status: DC
Start: 2023-05-24 — End: 2023-09-20

## 2023-05-24 MED ORDER — LISINOPRIL 40 MG PO TABS
40.0000 mg | ORAL_TABLET | Freq: Every day | ORAL | 1 refills | Status: DC
Start: 2023-05-24 — End: 2023-11-13

## 2023-05-24 NOTE — Telephone Encounter (Signed)
OV 05/02/23 Call to pharmacy- Ozempic has been refilled- so not needed Requested Prescriptions  Pending Prescriptions Disp Refills   OZEMPIC, 2 MG/DOSE, 8 MG/3ML SOPN [Pharmacy Med Name: OZEMPIC 8 MG/3 ML (2 MG/DOSE)]      Sig: INJECT 2 MG AS DIRECTED ONCE A WEEK.     Endocrinology:  Diabetes - GLP-1 Receptor Agonists - semaglutide Failed - 05/23/2023  2:42 PM      Failed - HBA1C in normal range and within 180 days    Hgb A1c MFr Bld  Date Value Ref Range Status  04/26/2023 8.3 (H) <5.7 % of total Hgb Final    Comment:    For someone without known diabetes, a hemoglobin A1c value of 6.5% or greater indicates that they may have  diabetes and this should be confirmed with a follow-up  test. . For someone with known diabetes, a value <7% indicates  that their diabetes is well controlled and a value  greater than or equal to 7% indicates suboptimal  control. A1c targets should be individualized based on  duration of diabetes, age, comorbid conditions, and  other considerations. . Currently, no consensus exists regarding use of hemoglobin A1c for diagnosis of diabetes for children. .          Failed - Valid encounter within last 6 months    Recent Outpatient Visits           1 year ago Need for immunization against influenza   [facility] Donita Brooks, MD   2 years ago Paroxysmal atrial fibrillation [facility])   [facility] Donita Brooks, MD   3 years ago DDD (degenerative disc disease), cervical   [facility] Donita Brooks, MD   3 years ago Cervical radiculopathy   [facility] Donita Brooks, MD   4 years ago Rhinosinusitis   [facility] Danelle Berry, PA-C              Passed - Cr in normal range and within 360 days    Creat  Date Value Ref Range Status  04/26/2023 0.80 0.70 - 1.35 mg/dL Final   Creatinine, Urine  Date Value Ref Range Status  11/10/2022 24 20 - 320  mg/dL Final          lisinopril (ZESTRIL) 40 MG tablet 90 tablet 1    Sig: Take 1 tablet (40 mg total) by mouth daily.     There is no refill protocol information for this order

## 2023-05-24 NOTE — Telephone Encounter (Signed)
Call to pharmacy- Rx was received 05/15/23- duplicate request

## 2023-05-25 ENCOUNTER — Other Ambulatory Visit: Payer: Self-pay | Admitting: Family Medicine

## 2023-05-25 DIAGNOSIS — E119 Type 2 diabetes mellitus without complications: Secondary | ICD-10-CM

## 2023-05-25 NOTE — Telephone Encounter (Signed)
Requested Prescriptions  Pending Prescriptions Disp Refills   pioglitazone (ACTOS) 30 MG tablet [Pharmacy Med Name: PIOGLITAZONE HCL 30 MG TABLET] 90 tablet 0    Sig: TAKE 1 TABLET (30 MG TOTAL) BY MOUTH DAILY. THIS REPLACES JANUVIA     Endocrinology:  Diabetes - Glitazones - pioglitazone Failed - 05/25/2023  1:26 AM      Failed - HBA1C is between 0 and 7.9 and within 180 days    Hgb A1c MFr Bld  Date Value Ref Range Status  04/26/2023 8.3 (H) <5.7 % of total Hgb Final    Comment:    For someone without known diabetes, a hemoglobin A1c value of 6.5% or greater indicates that they may have  diabetes and this should be confirmed with a follow-up  test. . For someone with known diabetes, a value <7% indicates  that their diabetes is well controlled and a value  greater than or equal to 7% indicates suboptimal  control. A1c targets should be individualized based on  duration of diabetes, age, comorbid conditions, and  other considerations. . Currently, no consensus exists regarding use of hemoglobin A1c for diagnosis of diabetes for children. .          Failed - Valid encounter within last 6 months    Recent Outpatient Visits           1 year ago Need for immunization against influenza   [facility] Donita Brooks, MD   2 years ago Paroxysmal atrial fibrillation [facility])   [facility] Tanya Nones, Priscille Heidelberg, MD   3 years ago DDD (degenerative disc disease), cervical   [facility] Pickard, Priscille Heidelberg, MD   3 years ago Cervical radiculopathy   [facility] Pickard, Priscille Heidelberg, MD   4 years ago Rhinosinusitis   [facility] Danelle Berry, PA-C

## 2023-05-26 ENCOUNTER — Other Ambulatory Visit: Payer: Self-pay | Admitting: Family Medicine

## 2023-05-26 NOTE — Telephone Encounter (Signed)
Requested Prescriptions  Refused Prescriptions Disp Refills   rosuvastatin (CRESTOR) 40 MG tablet [Pharmacy Med Name: ROSUVASTATIN CALCIUM 40 MG TAB] 45 tablet 3    Sig: TAKE 1/2 TABLET BY MOUTH DAILY     Cardiovascular:  Antilipid - Statins 2 Failed - 05/26/2023 10:41 AM      Failed - Valid encounter within last 12 months    Recent Outpatient Visits           1 year ago Need for immunization against influenza   [facility] Medicine Donita Brooks, MD   2 years ago Paroxysmal atrial fibrillation [facility])   [facility] Tanya Nones, Priscille Heidelberg, MD   3 years ago DDD (degenerative disc disease), cervical   [facility] Pickard, Priscille Heidelberg, MD   3 years ago Cervical radiculopathy   [facility] Donita Brooks, MD   4 years ago Rhinosinusitis   [facility] Danelle Berry, PA-C              Failed - Lipid Panel in normal range within the last 12 months    Cholesterol  Date Value Ref Range Status  04/26/2023 139 <200 mg/dL Final   LDL Cholesterol (Calc)  Date Value Ref Range Status  04/26/2023 75 mg/dL (calc) Final    Comment:    Reference range: <100 . Desirable range <100 mg/dL for primary prevention;   <70 mg/dL for patients with CHD or diabetic patients  with > or = 2 CHD risk factors. Marland Kitchen LDL-C is now calculated using the Martin-Hopkins  calculation, which is a validated novel method providing  better accuracy than the Friedewald equation in the  estimation of LDL-C.  Horald Pollen et al. Lenox Ahr. 3244;010(27): 2061-2068  (http://education.[facility].com/faq/FAQ164)    HDL  Date Value Ref Range Status  04/26/2023 44 > OR = 40 mg/dL Final   Triglycerides  Date Value Ref Range Status  04/26/2023 112 <150 mg/dL Final         Passed - Cr in normal range and within 360 days    Creat  Date Value Ref Range Status  04/26/2023 0.80 0.70 - 1.35 mg/dL Final   Creatinine, Urine  Date Value Ref Range  Status  11/10/2022 24 20 - 320 mg/dL Final         Passed - Patient is not pregnant

## 2023-05-30 ENCOUNTER — Encounter: Payer: Self-pay | Admitting: Family Medicine

## 2023-06-12 DIAGNOSIS — H2513 Age-related nuclear cataract, bilateral: Secondary | ICD-10-CM | POA: Diagnosis not present

## 2023-06-12 DIAGNOSIS — S6991XA Unspecified injury of right wrist, hand and finger(s), initial encounter: Secondary | ICD-10-CM | POA: Diagnosis not present

## 2023-06-12 DIAGNOSIS — H5203 Hypermetropia, bilateral: Secondary | ICD-10-CM | POA: Diagnosis not present

## 2023-06-12 DIAGNOSIS — X501XXA Overexertion from prolonged static or awkward postures, initial encounter: Secondary | ICD-10-CM | POA: Diagnosis not present

## 2023-06-12 DIAGNOSIS — S63501A Unspecified sprain of right wrist, initial encounter: Secondary | ICD-10-CM | POA: Diagnosis not present

## 2023-06-12 DIAGNOSIS — E119 Type 2 diabetes mellitus without complications: Secondary | ICD-10-CM | POA: Diagnosis not present

## 2023-06-12 LAB — HM DIABETES EYE EXAM

## 2023-06-15 ENCOUNTER — Ambulatory Visit (INDEPENDENT_AMBULATORY_CARE_PROVIDER_SITE_OTHER): Payer: Medicare HMO

## 2023-06-15 VITALS — Ht 78.0 in | Wt 258.0 lb

## 2023-06-15 DIAGNOSIS — Z0000 Encounter for general adult medical examination without abnormal findings: Secondary | ICD-10-CM

## 2023-06-15 NOTE — Patient Instructions (Signed)
Todd Ellis , Thank you for taking time to come for your Medicare Wellness Visit. I appreciate your ongoing commitment to your health goals. Please review the following plan we discussed and let me know if I can assist you in the future.   Referrals/Orders/Follow-Ups/Clinician Recommendations: Aim for 30 minutes of exercise or brisk walking, 6-8 glasses of water, and 5 servings of fruits and vegetables each day.  This is a list of the screening recommended for you and due dates:  Health Maintenance  Topic Date Due   Medicare Annual Wellness Visit  Never done   Eye exam for diabetics  09/12/2019   Zoster (Shingles) Vaccine (2 of 2) 05/07/2020   Flu Shot  05/04/2023   COVID-19 Vaccine (5 - 2023-24 season) 06/04/2023   Pneumonia Vaccine (2 of 2 - PCV) 11/11/2023*   Hemoglobin A1C  10/27/2023   Yearly kidney health urinalysis for diabetes  11/11/2023   Yearly kidney function blood test for diabetes  04/25/2024   Complete foot exam   05/01/2024   Colon Cancer Screening  05/02/2025   DTaP/Tdap/Td vaccine (3 - Td or Tdap) 02/22/2026   Hepatitis C Screening  Completed   HPV Vaccine  Aged Out  *Topic was postponed. The date shown is not the original due date.    Advanced directives: (ACP Link)Information on Advanced Care Planning can be found at [facility] Advance Health Care Directives Advance Health Care Directives ([email or web address]   Next Medicare Annual Wellness Visit scheduled for next year: Yes

## 2023-06-15 NOTE — Progress Notes (Signed)
Subjective:   Todd Ellis is a 67 y.o. male who presents for an Initial Medicare Annual Wellness Visit.  Visit Complete: Virtual  I connected with  Lavena Stanford on 06/15/23 by a audio enabled telemedicine application and verified that I am speaking with the correct person using two identifiers.  Patient Location: Home  Provider Location: Home Office  I discussed the limitations of evaluation and management by telemedicine. The patient expressed understanding and agreed to proceed.  Patient Medicare AWV questionnaire was completed by the patient on 06/14/23; I have confirmed that all information answered by patient is correct and no changes since this date.  Vital Signs: Because this visit was a virtual/telehealth visit, some criteria may be missing or patient reported. Any vitals not documented were not able to be obtained and vitals that have been documented are patient reported.   Review of Systems     Cardiac Risk Factors include: advanced age (>23men, >90 women);diabetes mellitus;dyslipidemia;male gender;hypertension     Objective:    Today's Vitals   06/15/23 0832  Weight: 258 lb (117 kg)  Height: 6\' 6"  (1.981 m)   Body mass index is 29.81 kg/m.     06/15/2023    8:49 AM 07/26/2016   10:48 AM  Advanced Directives  Does Patient Have a Medical Advance Directive? No No  Would patient like information on creating a medical advance directive? Yes (MAU/Ambulatory/Procedural Areas - Information given) No - patient declined information    Current Medications (verified) Outpatient Encounter Medications as of 06/15/2023  Medication Sig   amLODipine (NORVASC) 10 MG tablet TAKE 1 TABLET BY MOUTH EVERY DAY   Cholecalciferol (VITAMIN D3 PO) Take by mouth.   CINNAMON PO Take 1,000 mg by mouth 2 (two) times daily.    Cyanocobalamin (VITAMIN B 12 PO) Take 1,000 mg by mouth daily.   fluticasone (FLONASE) 50 MCG/ACT nasal spray Place 2 sprays into both nostrils daily.    levocetirizine (XYZAL ALLERGY 24HR) 5 MG tablet Take 1 tablet (5 mg total) by mouth every evening.   lisinopril (ZESTRIL) 40 MG tablet Take 1 tablet (40 mg total) by mouth daily.   metFORMIN (GLUCOPHAGE) 1000 MG tablet Take 1 tablet (1,000 mg total) by mouth 2 (two) times daily.   metoprolol succinate (TOPROL-XL) 50 MG 24 hr tablet TAKE 1 TABLET BY MOUTH EVERY DAY WITH OR IMMEDIATELY FOLLOWING A MEAL   Omega-3 Fatty Acids (FISH OIL) 1200 MG CAPS Take 3 capsules by mouth daily.   pioglitazone (ACTOS) 30 MG tablet TAKE 1 TABLET (30 MG TOTAL) BY MOUTH DAILY. THIS REPLACES JANUVIA   rosuvastatin (CRESTOR) 40 MG tablet TAKE 1/2 TABLET BY MOUTH DAILY   Semaglutide, 2 MG/DOSE, (OZEMPIC, 2 MG/DOSE,) 8 MG/3ML SOPN Inject 2 mg into the skin once a week.   spironolactone (ALDACTONE) 25 MG tablet TAKE 1 TABLET (25 MG TOTAL) BY MOUTH DAILY.   tiZANidine (ZANAFLEX) 4 MG capsule TAKE 1 CAPSULE BY MOUTH 3 TIMES DAILY AS NEEDED FOR MUSCLE SPASMS.   vitamin C (ASCORBIC ACID) 500 MG tablet Take 500 mg by mouth daily.   No facility-administered encounter medications on file as of 06/15/2023.    Allergies (verified) Lipitor [atorvastatin], Penicillin g, and Penicillins   History: Past Medical History:  Diagnosis Date   Allergy    mild   Arthritis    wrist, shoulder and neck   Atrial fibrillation (HCC)    pt reported he though the onset was maybe 3 years ago ( 2017 ) ,  no blood thinner per pt.   Atrial flutter (HCC)    Diabetes mellitus without complication (HCC)    Heart murmur    Hyperlipidemia    Hypertension    Sleep apnea    does not wear cpap   Past Surgical History:  Procedure Laterality Date   CARDIOVERSION N/A 07/26/2016   Procedure: CARDIOVERSION;  Surgeon: Yates Decamp, MD;  Location: [facility] ENDOSCOPY;  Service: Cardiovascular;  Laterality: N/A;   COLONOSCOPY     POLYPECTOMY     Family History  Problem Relation Age of Onset   Hypertension Mother    Colon cancer Neg Hx    Esophageal  cancer Neg Hx    Rectal cancer Neg Hx    Stomach cancer Neg Hx    Colon polyps Neg Hx    Social History   Socioeconomic History   Marital status: Married    Spouse name: Not on file   Number of children: 1   Years of education: Not on file   Highest education level: 12th grade  Occupational History   Not on file  Tobacco Use   Smoking status: Never   Smokeless tobacco: Former    Types: Chew    Quit date: 11/28/2000   Tobacco comments:    quit 2002  Vaping Use   Vaping status: Never Used  Substance and Sexual Activity   Alcohol use: Yes    Comment: Occasional   Drug use: No   Sexual activity: Not on file  Other Topics Concern   Not on file  Social History Narrative   Not on file   Social Determinants of Health   Financial Resource Strain: Low Risk  (06/14/2023)   Overall Financial Resource Strain (CARDIA)    Difficulty of Paying Living Expenses: Not hard at all  Food Insecurity: No Food Insecurity (06/14/2023)   Hunger Vital Sign    Worried About Running Out of Food in the Last Year: Never true    Ran Out of Food in the Last Year: Never true  Transportation Needs: No Transportation Needs (06/14/2023)   PRAPARE - Administrator, Civil Service (Medical): No    Lack of Transportation (Non-Medical): No  Physical Activity: Insufficiently Active (06/14/2023)   Exercise Vital Sign    Days of Exercise per Week: 2 days    Minutes of Exercise per Session: 30 min  Stress: No Stress Concern Present (06/14/2023)   [employer] of Occupational Health - Occupational Stress Questionnaire    Feeling of Stress : Not at all  Social Connections: Unknown (06/14/2023)   Social Connection and Isolation Panel [NHANES]    Frequency of Communication with Friends and Family: More than three times a week    Frequency of Social Gatherings with Friends and Family: Three times a week    Attends Religious Services: Patient declined    Active Member of Clubs or Organizations: No     Attends Banker Meetings: Never    Marital Status: Married    Tobacco Counseling Counseling given: Not Answered Tobacco comments: quit 2002   Clinical Intake:  Pre-visit preparation completed: Yes  Pain : No/denies pain     Diabetes: Yes CBG done?: No Did pt. bring in CBG monitor from home?: No  How often do you need to have someone help you when you read instructions, pamphlets, or other written materials from your doctor or pharmacy?: 1 - Never  Interpreter Needed?: No  Information entered by :: Kandis Fantasia LPN  Activities of Daily Living    06/14/2023    9:56 AM  In your present state of health, do you have any difficulty performing the following activities:  Hearing? 0  Vision? 0  Difficulty concentrating or making decisions? 0  Walking or climbing stairs? 0  Dressing or bathing? 0  Doing errands, shopping? 0  Preparing Food and eating ? N  Using the Toilet? N  In the past six months, have you accidently leaked urine? N  Do you have problems with loss of bowel control? N  Managing your Medications? N  Managing your Finances? N  Housekeeping or managing your Housekeeping? N    Patient Care Team: Donita Brooks, MD as PCP - General (Family Medicine) Elise Benne, MD as Consulting Physician (Ophthalmology)  Indicate any recent Medical Services you may have received from other than [facility] providers in the past year (date may be approximate).     Assessment:   This is a routine wellness examination for [place].  Hearing/Vision screen Hearing Screening - Comments:: Denies hearing difficulties   Vision Screening - Comments::  up to date with routine eye exams with Dr. Emily Filbert     Goals Addressed             This Visit's Progress    Remain active and independent        Depression Screen    06/15/2023    8:49 AM 01/24/2023    9:10 AM 11/10/2022    4:24 PM 07/21/2022    2:44 PM 05/23/2022   12:20 PM 04/07/2020   10:30 AM 11/15/2018     3:07 PM  PHQ 2/9 Scores  PHQ - 2 Score 0 0 0 0 0 0 0  PHQ- 9 Score      0     Fall Risk    06/15/2023    8:50 AM 06/14/2023    9:56 AM 01/24/2023    9:10 AM 11/10/2022    4:24 PM 07/21/2022    2:44 PM  Fall Risk   Falls in the past year? 0 0 0 0 0  Number falls in past yr: 0 0 0 0 0  Injury with Fall? 0 0 0 0 0  Risk for fall due to : No Fall Risks  No Fall Risks No Fall Risks No Fall Risks  Follow up Falls prevention discussed;Education provided;Falls evaluation completed  Falls prevention discussed Falls prevention discussed Falls prevention discussed    MEDICARE RISK AT HOME: Medicare Risk at Home Any stairs in or around the home?: Yes If so, are there any without handrails?: No Home free of loose throw rugs in walkways, pet beds, electrical cords, etc?: No Adequate lighting in your home to reduce risk of falls?: Yes Life alert?: No Use of a cane, walker or w/c?: No Grab bars in the bathroom?: No Shower chair or bench in shower?: No Elevated toilet seat or a handicapped toilet?: No  TIMED UP AND GO:  Was the test performed? No    Cognitive Function:        06/15/2023    8:50 AM  6CIT Screen  What Year? 0 points  What month? 0 points  What time? 0 points  Count back from 20 0 points  Months in reverse 0 points  Repeat phrase 0 points  Total Score 0 points    Immunizations Immunization History  Administered Date(s) Administered   COVID-19, mRNA, vaccine(Comirnaty)12 years and older 07/18/2022   Influenza Inj Mdck Quad Pf 06/14/2018  Influenza, High Dose Seasonal PF 07/18/2022   Influenza,inj,Quad PF,6+ Mos 07/20/2017, 06/14/2018, 07/11/2019, 07/29/2021   PFIZER(Purple Top)SARS-COV-2 Vaccination 01/28/2020, 02/18/2020   Pfizer Covid-19 Vaccine Bivalent Booster 3yrs & up 08/03/2021   Pneumococcal Polysaccharide-23 04/16/2018   Td 02/03/2006   Tdap 02/23/2016   Zoster Recombinant(Shingrix) 03/12/2020    TDAP status: Up to date  Flu Vaccine status:  Due, Education has been provided regarding the importance of this vaccine. Advised may receive this vaccine at local pharmacy or Health Dept. Aware to provide a copy of the vaccination record if obtained from local pharmacy or Health Dept. Verbalized acceptance and understanding.  Pneumococcal vaccine status: Up to date  Covid-19 vaccine status: Information provided on how to obtain vaccines.   Qualifies for Shingles Vaccine? Yes   Zostavax completed No   Shingrix Completed?: No.    Education has been provided regarding the importance of this vaccine. Patient has been advised to call insurance company to determine out of pocket expense if they have not yet received this vaccine. Advised may also receive vaccine at local pharmacy or Health Dept. Verbalized acceptance and understanding.  Screening Tests Health Maintenance  Topic Date Due   OPHTHALMOLOGY EXAM  09/12/2019   Zoster Vaccines- Shingrix (2 of 2) 05/07/2020   INFLUENZA VACCINE  05/04/2023   COVID-19 Vaccine (5 - 2023-24 season) 06/04/2023   Pneumonia Vaccine 59+ Years old (2 of 2 - PCV) 11/11/2023 (Originally 05/18/2022)   HEMOGLOBIN A1C  10/27/2023   Diabetic kidney evaluation - Urine ACR  11/11/2023   Diabetic kidney evaluation - eGFR measurement  04/25/2024   FOOT EXAM  05/01/2024   Medicare Annual Wellness (AWV)  06/14/2024   Colonoscopy  05/02/2025   DTaP/Tdap/Td (3 - Td or Tdap) 02/22/2026   Hepatitis C Screening  Completed   HPV VACCINES  Aged Out    Health Maintenance  Health Maintenance Due  Topic Date Due   OPHTHALMOLOGY EXAM  09/12/2019   Zoster Vaccines- Shingrix (2 of 2) 05/07/2020   INFLUENZA VACCINE  05/04/2023   COVID-19 Vaccine (5 - 2023-24 season) 06/04/2023    Colorectal cancer screening: Type of screening: Colonoscopy. Completed 05/02/22. Repeat every 3 years  Lung Cancer Screening: (Low Dose CT Chest recommended if Age 25-80 years, 20 pack-year currently smoking OR have quit w/in 15years.) does not  qualify.   Lung Cancer Screening Referral: n/a  Additional Screening:  Hepatitis C Screening: does qualify; Completed 02/28/18  Vision Screening: Recommended annual ophthalmology exams for early detection of glaucoma and other disorders of the eye. Is the patient up to date with their annual eye exam?  Yes  Who is the provider or what is the name of the office in which the patient attends annual eye exams? Dr. Emily Filbert If pt is not established with a provider, would they like to be referred to a provider to establish care? No .   Dental Screening: Recommended annual dental exams for proper oral hygiene  Diabetic Foot Exam: Diabetic Foot Exam: Completed 05/02/23  Community Resource Referral / Chronic Care Management: CRR required this visit?  No   CCM required this visit?  No    Plan:     I have personally reviewed and noted the following in the patient's chart:   Medical and social history Use of alcohol, tobacco or illicit drugs  Current medications and supplements including opioid prescriptions. Patient is not currently taking opioid prescriptions. Functional ability and status Nutritional status Physical activity Advanced directives List of other physicians Hospitalizations, surgeries,  and ER visits in previous 12 months Vitals Screenings to include cognitive, depression, and falls Referrals and appointments  In addition, I have reviewed and discussed with patient certain preventive protocols, quality metrics, and best practice recommendations. A written personalized care plan for preventive services as well as general preventive health recommendations were provided to patient.     Kandis Fantasia [place], [place]   1/61/0960   After Visit Summary: (MyChart) Due to this being a telephonic visit, the after visit summary with patients personalized plan was offered to patient via MyChart   Nurse Notes: No concerns at this time

## 2023-06-23 ENCOUNTER — Encounter: Payer: Self-pay | Admitting: Family Medicine

## 2023-08-18 ENCOUNTER — Other Ambulatory Visit: Payer: Self-pay | Admitting: Family Medicine

## 2023-08-18 NOTE — Telephone Encounter (Signed)
Requested Prescriptions  Pending Prescriptions Disp Refills   rosuvastatin (CRESTOR) 40 MG tablet [Pharmacy Med Name: ROSUVASTATIN CALCIUM 40 MG TAB] 45 tablet 1    Sig: TAKE 1/2 TABLET BY MOUTH DAILY     Cardiovascular:  Antilipid - Statins 2 Failed - 08/18/2023  1:31 AM      Failed - Valid encounter within last 12 months    Recent Outpatient Visits           2 years ago Need for immunization against influenza   [facility] Donita Brooks, MD   2 years ago Paroxysmal atrial fibrillation [facility])   [facility] Tanya Nones, Priscille Heidelberg, MD   3 years ago DDD (degenerative disc disease), cervical   [facility] Pickard, Priscille Heidelberg, MD   3 years ago Cervical radiculopathy   [facility] Donita Brooks, MD   4 years ago Rhinosinusitis   [facility] Danelle Berry, PA-C              Failed - Lipid Panel in normal range within the last 12 months    Cholesterol  Date Value Ref Range Status  04/26/2023 139 <200 mg/dL Final   LDL Cholesterol (Calc)  Date Value Ref Range Status  04/26/2023 75 mg/dL (calc) Final    Comment:    Reference range: <100 . Desirable range <100 mg/dL for primary prevention;   <70 mg/dL for patients with CHD or diabetic patients  with > or = 2 CHD risk factors. Marland Kitchen LDL-C is now calculated using the Martin-Hopkins  calculation, which is a validated novel method providing  better accuracy than the Friedewald equation in the  estimation of LDL-C.  Horald Pollen et al. Lenox Ahr. 6045;409(81): 2061-2068  (http://education.[facility].com/faq/FAQ164)    HDL  Date Value Ref Range Status  04/26/2023 44 > OR = 40 mg/dL Final   Triglycerides  Date Value Ref Range Status  04/26/2023 112 <150 mg/dL Final         Passed - Cr in normal range and within 360 days    Creat  Date Value Ref Range Status  04/26/2023 0.80 0.70 - 1.35 mg/dL Final   Creatinine, Urine  Date Value Ref Range  Status  11/10/2022 24 20 - 320 mg/dL Final         Passed - Patient is not pregnant

## 2023-08-23 ENCOUNTER — Other Ambulatory Visit: Payer: Self-pay

## 2023-08-23 ENCOUNTER — Telehealth: Payer: Self-pay

## 2023-08-23 DIAGNOSIS — E119 Type 2 diabetes mellitus without complications: Secondary | ICD-10-CM

## 2023-08-23 MED ORDER — PIOGLITAZONE HCL 30 MG PO TABS: 30.0000 mg | ORAL_TABLET | Freq: Every day | ORAL | 0 refills | Status: DC

## 2023-08-23 NOTE — Telephone Encounter (Signed)
Prescription Request  08/23/2023  LOV: 05/02/23  What is the name of the medication or equipment? pioglitazone (ACTOS) 30 MG tablet [[id number]]   Have you contacted your pharmacy to request a refill? Yes   Which pharmacy would you like this sent to?  [id number] [place], [place] - [address] [place] [address] Phone: [phone] Fax: [phone]    Patient notified that their request is being sent to the clinical staff for review and that they should receive a response within 2 business days.   Please advise at [facility] [phone]

## 2023-09-05 ENCOUNTER — Other Ambulatory Visit: Payer: Self-pay | Admitting: Family Medicine

## 2023-09-05 DIAGNOSIS — R000 Tachycardia, unspecified: Secondary | ICD-10-CM

## 2023-09-07 NOTE — Telephone Encounter (Signed)
Due labs- 12/24,  OV 05/02/23 Requested Prescriptions  Pending Prescriptions Disp Refills   metoprolol succinate (TOPROL-XL) 50 MG 24 hr tablet [Pharmacy Med Name: METOPROLOL SUCC ER 50 MG TAB] 90 tablet 0    Sig: TAKE 1 TABLET BY MOUTH EVERY DAY WITH OR IMMEDIATELY FOLLOWING A MEAL     Cardiovascular:  Beta Blockers Failed - 09/05/2023  1:38 AM      Failed - Valid encounter within last 6 months    Recent Outpatient Visits           2 years ago Need for immunization against influenza   [facility] Donita Brooks, MD   2 years ago Paroxysmal atrial fibrillation (HCC)   [facility] Tanya Nones, Priscille Heidelberg, MD   3 years ago DDD (degenerative disc disease), cervical   [facility] Pickard, Priscille Heidelberg, MD   3 years ago Cervical radiculopathy   [facility] Tanya Nones, Priscille Heidelberg, MD   4 years ago Rhinosinusitis   [facility] Danelle Berry, PA-C              Passed - Last BP in normal range    BP Readings from Last 1 Encounters:  05/02/23 122/68         Passed - Last Heart Rate in normal range    Pulse Readings from Last 1 Encounters:  05/02/23 95          spironolactone (ALDACTONE) 25 MG tablet [Pharmacy Med Name: SPIRONOLACTONE 25 MG TABLET] 90 tablet 0    Sig: TAKE 1 TABLET (25 MG TOTAL) BY MOUTH DAILY.     Cardiovascular: Diuretics - Aldosterone Antagonist Failed - 09/05/2023  1:38 AM      Failed - Valid encounter within last 6 months    Recent Outpatient Visits           2 years ago Need for immunization against influenza   [facility] Donita Brooks, MD   2 years ago Paroxysmal atrial fibrillation (HCC)   [facility] Donita Brooks, MD   3 years ago DDD (degenerative disc disease), cervical   [facility] Donita Brooks, MD   3 years ago Cervical radiculopathy   [facility] Donita Brooks, MD   4 years ago  Rhinosinusitis   [facility] Danelle Berry, PA-C              Passed - Cr in normal range and within 180 days    Creat  Date Value Ref Range Status  04/26/2023 0.80 0.70 - 1.35 mg/dL Final   Creatinine, Urine  Date Value Ref Range Status  11/10/2022 24 20 - 320 mg/dL Final         Passed - K in normal range and within 180 days    Potassium  Date Value Ref Range Status  04/26/2023 4.4 3.5 - 5.3 mmol/L Final         Passed - Na in normal range and within 180 days    Sodium  Date Value Ref Range Status  04/26/2023 139 135 - 146 mmol/L Final         Passed - eGFR is 30 or above and within 180 days    GFR, Est African American  Date Value Ref Range Status  11/30/2020 105 > OR = 60 mL/min/1.6m2 Final   GFR, Est Non African American  Date Value Ref Range Status  11/30/2020 91 > OR = 60 mL/min/1.53m2 Final   eGFR  Date Value Ref Range Status  04/26/2023 98 > OR = 60 mL/min/1.66m2 Final         Passed - Last BP in normal range    BP Readings from Last 1 Encounters:  05/02/23 122/68

## 2023-09-09 ENCOUNTER — Other Ambulatory Visit: Payer: Self-pay | Admitting: Family Medicine

## 2023-09-09 DIAGNOSIS — E119 Type 2 diabetes mellitus without complications: Secondary | ICD-10-CM

## 2023-09-11 ENCOUNTER — Telehealth: Payer: Self-pay

## 2023-09-11 NOTE — Telephone Encounter (Signed)
Copied from CRM [id number]. Topic: Clinical - Request for Lab/Test Order >> Sep 11, 2023 11:16 AM Todd Ellis A wrote: Reason for CRM: Patient wants to confirm when he is due for 6 month lab work. Patient was last seen for lab work back in July. Please contact patient to discuss 6 month appointment and if scheduling is needed. Patient can be reached at [phone].

## 2023-09-11 NOTE — Telephone Encounter (Signed)
Requested medication (s) are due for refill today: na  Requested medication (s) are on the active medication list: yes  Last refill:  05/24/23 #3 ml 3 refills  Future visit scheduled: no  Notes to clinic:  last OV 05/02/23 . How much do you want to dispense?     Requested Prescriptions  Pending Prescriptions Disp Refills   OZEMPIC, 2 MG/DOSE, 8 MG/3ML SOPN [Pharmacy Med Name: OZEMPIC 8 MG/3 ML (2 MG/DOSE)]  3    Sig: Inject 2 mg into the skin once a week.     Endocrinology:  Diabetes - GLP-1 Receptor Agonists - semaglutide Failed - 09/09/2023  9:24 AM      Failed - HBA1C in normal range and within 180 days    Hgb A1c MFr Bld  Date Value Ref Range Status  04/26/2023 8.3 (H) <5.7 % of total Hgb Final    Comment:    For someone without known diabetes, a hemoglobin A1c value of 6.5% or greater indicates that they may have  diabetes and this should be confirmed with a follow-up  test. . For someone with known diabetes, a value <7% indicates  that their diabetes is well controlled and a value  greater than or equal to 7% indicates suboptimal  control. A1c targets should be individualized based on  duration of diabetes, age, comorbid conditions, and  other considerations. . Currently, no consensus exists regarding use of hemoglobin A1c for diagnosis of diabetes for children. .          Failed - Valid encounter within last 6 months    Recent Outpatient Visits           2 years ago Need for immunization against influenza   [facility] Donita Brooks, MD   2 years ago Paroxysmal atrial fibrillation [facility])   [facility] Tanya Nones, Priscille Heidelberg, MD   3 years ago DDD (degenerative disc disease), cervical   [facility] Pickard, Priscille Heidelberg, MD   3 years ago Cervical radiculopathy   [facility] Tanya Nones, Priscille Heidelberg, MD   4 years ago Rhinosinusitis   [facility] Danelle Berry, PA-C              Passed  - Cr in normal range and within 360 days    Creat  Date Value Ref Range Status  04/26/2023 0.80 0.70 - 1.35 mg/dL Final   Creatinine, Urine  Date Value Ref Range Status  11/10/2022 24 20 - 320 mg/dL Final

## 2023-09-18 ENCOUNTER — Encounter: Payer: Self-pay | Admitting: Family Medicine

## 2023-09-20 ENCOUNTER — Other Ambulatory Visit: Payer: Self-pay

## 2023-09-20 DIAGNOSIS — E119 Type 2 diabetes mellitus without complications: Secondary | ICD-10-CM

## 2023-09-20 MED ORDER — OZEMPIC (2 MG/DOSE) 8 MG/3ML ~~LOC~~ SOPN
2.0000 mg | PEN_INJECTOR | SUBCUTANEOUS | 3 refills | Status: DC
Start: 2023-09-20 — End: 2024-01-02

## 2023-10-20 ENCOUNTER — Other Ambulatory Visit: Payer: Self-pay | Admitting: Family Medicine

## 2023-11-12 ENCOUNTER — Other Ambulatory Visit: Payer: Self-pay | Admitting: Family Medicine

## 2023-11-12 DIAGNOSIS — I10 Essential (primary) hypertension: Secondary | ICD-10-CM

## 2023-11-12 DIAGNOSIS — R000 Tachycardia, unspecified: Secondary | ICD-10-CM

## 2023-11-12 DIAGNOSIS — I480 Paroxysmal atrial fibrillation: Secondary | ICD-10-CM

## 2023-11-26 ENCOUNTER — Other Ambulatory Visit: Payer: Self-pay | Admitting: Family Medicine

## 2023-11-26 DIAGNOSIS — E119 Type 2 diabetes mellitus without complications: Secondary | ICD-10-CM

## 2023-12-04 ENCOUNTER — Telehealth: Payer: Self-pay | Admitting: Family Medicine

## 2023-12-04 DIAGNOSIS — R000 Tachycardia, unspecified: Secondary | ICD-10-CM

## 2023-12-04 MED ORDER — SPIRONOLACTONE 25 MG PO TABS: 25.0000 mg | ORAL_TABLET | Freq: Every day | ORAL | 0 refills | Status: DC

## 2023-12-04 MED ORDER — METOPROLOL SUCCINATE ER 50 MG PO TB24: ORAL_TABLET | ORAL | 0 refills | Status: DC

## 2023-12-04 NOTE — Telephone Encounter (Addendum)
 Prescription Request  12/04/2023  LOV: 05/02/2023  What is the name of the medication or equipment?   metoprolol succinate (TOPROL-XL) 50 MG 24 hr tablet   spironolactone (ALDACTONE) 25 MG tablet   Have you contacted your pharmacy to request a refill? Yes   Which pharmacy would you like this sent to?  [id number] [place], [place] - [address] [place] [address] Phone: [phone] Fax: [phone]    Patient notified that their request is being sent to the clinical staff for review and that they should receive a response within 2 business days.   Please advise pharmacist.

## 2023-12-04 NOTE — Addendum Note (Signed)
 Addended by: Arta Silence on: 12/04/2023 12:39 PM   Modules accepted: Orders

## 2023-12-06 ENCOUNTER — Telehealth: Payer: Self-pay

## 2023-12-06 NOTE — Telephone Encounter (Signed)
 Copied from CRM [id number]. Topic: Clinical - Request for Lab/Test Order >> Dec 06, 2023 10:22 AM Martha Clan wrote: Reason for CRM: Patient needs labs ordered for his upcoming appointment in March 13th.

## 2023-12-12 ENCOUNTER — Other Ambulatory Visit

## 2023-12-12 DIAGNOSIS — Z125 Encounter for screening for malignant neoplasm of prostate: Secondary | ICD-10-CM | POA: Diagnosis not present

## 2023-12-12 DIAGNOSIS — E7800 Pure hypercholesterolemia, unspecified: Secondary | ICD-10-CM

## 2023-12-12 DIAGNOSIS — I10 Essential (primary) hypertension: Secondary | ICD-10-CM | POA: Diagnosis not present

## 2023-12-12 DIAGNOSIS — E119 Type 2 diabetes mellitus without complications: Secondary | ICD-10-CM | POA: Diagnosis not present

## 2023-12-13 LAB — COMPLETE METABOLIC PANEL WITH GFR
AG Ratio: 2.9 (calc) — ABNORMAL HIGH (ref 1.0–2.5)
ALT: 18 U/L (ref 9–46)
AST: 13 U/L (ref 10–35)
Albumin: 4.7 g/dL (ref 3.6–5.1)
Alkaline phosphatase (APISO): 48 U/L (ref 35–144)
BUN: 11 mg/dL (ref 7–25)
CO2: 29 mmol/L (ref 20–32)
Calcium: 9.9 mg/dL (ref 8.6–10.3)
Chloride: 104 mmol/L (ref 98–110)
Creat: 0.79 mg/dL (ref 0.70–1.35)
Globulin: 1.6 g/dL — ABNORMAL LOW (ref 1.9–3.7)
Glucose, Bld: 118 mg/dL — ABNORMAL HIGH (ref 65–99)
Potassium: 4.5 mmol/L (ref 3.5–5.3)
Sodium: 141 mmol/L (ref 135–146)
Total Bilirubin: 0.6 mg/dL (ref 0.2–1.2)
Total Protein: 6.3 g/dL (ref 6.1–8.1)
eGFR: 98 mL/min/{1.73_m2} (ref >=60)

## 2023-12-13 LAB — PROTEIN / CREATININE RATIO, URINE
Creatinine, Urine: 111 mg/dL (ref 20–320)
Protein/Creat Ratio: 45 mg/g{creat} (ref 25–148)
Protein/Creatinine Ratio: 0.045 mg/mg{creat} (ref 0.025–0.148)
Total Protein, Urine: 5 mg/dL (ref 5–25)

## 2023-12-13 LAB — CBC WITH DIFFERENTIAL/PLATELET
Absolute Lymphocytes: 1664 {cells}/uL (ref 850–3900)
Absolute Monocytes: 414 {cells}/uL (ref 200–950)
Basophils Absolute: 19 {cells}/uL (ref 0–200)
Basophils Relative: 0.4 %
Eosinophils Absolute: 28 {cells}/uL (ref 15–500)
Eosinophils Relative: 0.6 %
HCT: 43.1 % (ref 38.5–50.0)
Hemoglobin: 14.2 g/dL (ref 13.2–17.1)
MCH: 28.9 pg (ref 27.0–33.0)
MCHC: 32.9 g/dL (ref 32.0–36.0)
MCV: 87.8 fL (ref 80.0–100.0)
MPV: 11 fL (ref 7.5–12.5)
Monocytes Relative: 8.8 %
Neutro Abs: 2576 {cells}/uL (ref 1500–7800)
Neutrophils Relative %: 54.8 %
Platelets: 195 10*3/uL (ref 140–400)
RBC: 4.91 10*6/uL (ref 4.20–5.80)
RDW: 12.1 % (ref 11.0–15.0)
Total Lymphocyte: 35.4 %
WBC: 4.7 10*3/uL (ref 3.8–10.8)

## 2023-12-13 LAB — PSA: PSA: 1.21 ng/mL (ref <=4.00)

## 2023-12-13 LAB — LIPID PANEL
Cholesterol: 120 mg/dL (ref <200)
HDL: 44 mg/dL (ref >=40)
LDL Cholesterol (Calc): 62 mg/dL
Non-HDL Cholesterol (Calc): 76 mg/dL (ref <130)
Total CHOL/HDL Ratio: 2.7 (calc) (ref <5.0)
Triglycerides: 60 mg/dL (ref <150)

## 2023-12-13 LAB — HEMOGLOBIN A1C
Hgb A1c MFr Bld: 6.4 %{Hb} — ABNORMAL HIGH (ref <5.7)
Mean Plasma Glucose: 137 mg/dL
eAG (mmol/L): 7.6 mmol/L

## 2023-12-14 ENCOUNTER — Encounter: Payer: Self-pay | Admitting: Family Medicine

## 2023-12-14 ENCOUNTER — Ambulatory Visit (INDEPENDENT_AMBULATORY_CARE_PROVIDER_SITE_OTHER): Admitting: Family Medicine

## 2023-12-14 VITALS — BP 120/66 | HR 69 | Temp 98.4°F | Ht 78.0 in | Wt 230.6 lb

## 2023-12-14 DIAGNOSIS — E119 Type 2 diabetes mellitus without complications: Secondary | ICD-10-CM | POA: Diagnosis not present

## 2023-12-14 DIAGNOSIS — E7800 Pure hypercholesterolemia, unspecified: Secondary | ICD-10-CM

## 2023-12-14 DIAGNOSIS — Z7985 Long-term (current) use of injectable non-insulin antidiabetic drugs: Secondary | ICD-10-CM

## 2023-12-14 DIAGNOSIS — I10 Essential (primary) hypertension: Secondary | ICD-10-CM

## 2023-12-14 DIAGNOSIS — R000 Tachycardia, unspecified: Secondary | ICD-10-CM | POA: Diagnosis not present

## 2023-12-14 DIAGNOSIS — I480 Paroxysmal atrial fibrillation: Secondary | ICD-10-CM | POA: Diagnosis not present

## 2023-12-14 DIAGNOSIS — J302 Other seasonal allergic rhinitis: Secondary | ICD-10-CM

## 2023-12-14 MED ORDER — ROSUVASTATIN CALCIUM 40 MG PO TABS: 20.0000 mg | ORAL_TABLET | Freq: Every day | ORAL | 1 refills | Status: DC

## 2023-12-14 MED ORDER — LISINOPRIL 40 MG PO TABS: 40.0000 mg | ORAL_TABLET | Freq: Every day | ORAL | 1 refills | Status: DC

## 2023-12-14 MED ORDER — AMLODIPINE BESYLATE 10 MG PO TABS: 10.0000 mg | ORAL_TABLET | Freq: Every day | ORAL | 1 refills | Status: DC

## 2023-12-14 MED ORDER — LEVOCETIRIZINE DIHYDROCHLORIDE 5 MG PO TABS
5.0000 mg | ORAL_TABLET | Freq: Every evening | ORAL | 1 refills | Status: DC
Start: 2023-12-14 — End: 2024-07-03

## 2023-12-14 MED ORDER — SPIRONOLACTONE 25 MG PO TABS: 25.0000 mg | ORAL_TABLET | Freq: Every day | ORAL | 1 refills | Status: DC

## 2023-12-14 MED ORDER — METFORMIN HCL 1000 MG PO TABS: 1000.0000 mg | ORAL_TABLET | Freq: Two times a day (BID) | ORAL | 1 refills | Status: DC

## 2023-12-14 MED ORDER — PIOGLITAZONE HCL 30 MG PO TABS: 30.0000 mg | ORAL_TABLET | Freq: Every day | ORAL | 1 refills | Status: DC

## 2023-12-14 MED ORDER — METOPROLOL SUCCINATE ER 50 MG PO TB24: ORAL_TABLET | ORAL | 1 refills | Status: DC

## 2023-12-14 NOTE — Progress Notes (Signed)
 Subjective:    Patient ID: Todd Ellis, male    DOB: 17-Oct-1955, 68 y.o.   MRN: [id number]  Wt Readings from Last 3 Encounters:  12/14/23 230 lb 9.6 oz (104.6 kg)  06/15/23 258 lb (117 kg)  05/02/23 258 lb (117 kg)   Patient continues to lose weight.  He is no longer drinking alcohol.  His weight is down almost 28 pounds from September.  He denies any chest pain shortness of breath or dyspnea on exertion.  His blood pressure and his hemoglobin A1c are outstanding.  He denies any hypoglycemic episodes.  He denies any burning or stinging in his feet.  He has normal sensation to monofilament.  He is due for Prevnar 20.  He is also due for the shingles vaccine.  He prefers to get this at his pharmacy. Lab on 12/12/2023  Component Date Value Ref Range Status   Glucose, Bld 12/12/2023 118 (H)  65 - 99 mg/dL Final   Comment: .            Fasting reference interval . For someone without known diabetes, a glucose value between 100 and 125 mg/dL is consistent with prediabetes and should be confirmed with a follow-up test. .    BUN 12/12/2023 11  7 - 25 mg/dL Final   Creat 40/98/1191 0.79  0.70 - 1.35 mg/dL Final   eGFR 47/82/9562 98  > OR = 60 mL/min/1.106m2 Final   BUN/Creatinine Ratio 12/12/2023 SEE NOTE:  6 - 22 (calc) Final   Comment:    Not Reported: BUN and Creatinine are within    reference range. .    Sodium 12/12/2023 141  135 - 146 mmol/L Final   Potassium 12/12/2023 4.5  3.5 - 5.3 mmol/L Final   Chloride 12/12/2023 104  98 - 110 mmol/L Final   CO2 12/12/2023 29  20 - 32 mmol/L Final   Calcium 12/12/2023 9.9  8.6 - 10.3 mg/dL Final   Total Protein 13/05/6577 6.3  6.1 - 8.1 g/dL Final   Albumin 46/96/2952 4.7  3.6 - 5.1 g/dL Final   Globulin 84/13/2440 1.6 (L)  1.9 - 3.7 g/dL (calc) Final   AG Ratio 12/12/2023 2.9 (H)  1.0 - 2.5 (calc) Final   Total Bilirubin 12/12/2023 0.6  0.2 - 1.2 mg/dL Final   Alkaline phosphatase (APISO) 12/12/2023 48  35 - 144 U/L Final   AST  12/12/2023 13  10 - 35 U/L Final   ALT 12/12/2023 18  9 - 46 U/L Final   WBC 12/12/2023 4.7  3.8 - 10.8 Thousand/uL Final   RBC 12/12/2023 4.91  4.20 - 5.80 Million/uL Final   Hemoglobin 12/12/2023 14.2  13.2 - 17.1 g/dL Final   HCT 07/30/2535 43.1  38.5 - 50.0 % Final   MCV 12/12/2023 87.8  80.0 - 100.0 fL Final   MCH 12/12/2023 28.9  27.0 - 33.0 pg Final   MCHC 12/12/2023 32.9  32.0 - 36.0 g/dL Final   Comment: For adults, a slight decrease in the calculated MCHC value (in the range of 30 to 32 g/dL) is most likely not clinically significant; however, it should be interpreted with caution in correlation with other red cell parameters and the patient's clinical condition.    RDW 12/12/2023 12.1  11.0 - 15.0 % Final   Platelets 12/12/2023 195  140 - 400 Thousand/uL Final   MPV 12/12/2023 11.0  7.5 - 12.5 fL Final   Neutro Abs 12/12/2023 2,576  1,500 - 7,800 cells/uL  Final   Absolute Lymphocytes 12/12/2023 1,664  850 - 3,900 cells/uL Final   Absolute Monocytes 12/12/2023 414  200 - 950 cells/uL Final   Eosinophils Absolute 12/12/2023 28  15 - 500 cells/uL Final   Basophils Absolute 12/12/2023 19  0 - 200 cells/uL Final   Neutrophils Relative % 12/12/2023 54.8  % Final   Total Lymphocyte 12/12/2023 35.4  % Final   Monocytes Relative 12/12/2023 8.8  % Final   Eosinophils Relative 12/12/2023 0.6  % Final   Basophils Relative 12/12/2023 0.4  % Final   Hgb A1c MFr Bld 12/12/2023 6.4 (H)  <5.7 % of total Hgb Final   Comment: For someone without known diabetes, a hemoglobin  A1c value between 5.7% and 6.4% is consistent with prediabetes and should be confirmed with a  follow-up test. . For someone with known diabetes, a value <7% indicates that their diabetes is well controlled. A1c targets should be individualized based on duration of diabetes, age, comorbid conditions, and other considerations. . This assay result is consistent with an increased risk of diabetes. . Currently, no  consensus exists regarding use of hemoglobin A1c for diagnosis of diabetes for children. .    Mean Plasma Glucose 12/12/2023 137  mg/dL Final   eAG (mmol/L) 40/98/1191 7.6  mmol/L Final   Cholesterol 12/12/2023 120  <200 mg/dL Final   HDL 47/82/9562 44  > OR = 40 mg/dL Final   Triglycerides 13/05/6577 60  <150 mg/dL Final   LDL Cholesterol (Calc) 12/12/2023 62  mg/dL (calc) Final   Comment: Reference range: <100 . Desirable range <100 mg/dL for primary prevention;   <70 mg/dL for patients with CHD or diabetic patients  with > or = 2 CHD risk factors. Marland Kitchen LDL-C is now calculated using the Martin-Hopkins  calculation, which is a validated novel method providing  better accuracy than the Friedewald equation in the  estimation of LDL-C.  Horald Pollen et al. Lenox Ahr. 4696;295(28): 2061-2068  (http://education.[facility].com/faq/FAQ164)    Total CHOL/HDL Ratio 12/12/2023 2.7  <4.1 (calc) Final   Non-HDL Cholesterol (Calc) 12/12/2023 76  <130 mg/dL (calc) Final   Comment: For patients with diabetes plus 1 major ASCVD risk  factor, treating to a non-HDL-C goal of <100 mg/dL  (LDL-C of <32 mg/dL) is considered a therapeutic  option.    PSA 12/12/2023 1.21  < OR = 4.00 ng/mL Final   Comment: The total PSA value from this assay system is  standardized against the WHO standard. The test  result will be approximately 20% lower when compared  to the equimolar-standardized total PSA (Beckman  Coulter). Comparison of serial PSA results should be  interpreted with this fact in mind. . This test was performed using the Siemens  chemiluminescent method. Values obtained from  different assay methods cannot be used interchangeably. PSA levels, regardless of value, should not be interpreted as absolute evidence of the presence or absence of disease.    Creatinine, Urine 12/12/2023 111  20 - 320 mg/dL Final   Protein/Creat Ratio 12/12/2023 45  25 - 148 mg/g creat Final   Protein/Creatinine  Ratio 12/12/2023 0.045  0.025 - 0.148 mg/mg creat Final   Total Protein, Urine 12/12/2023 5  5 - 25 mg/dL Final   Past Medical History:  Diagnosis Date   Allergy    mild   Arthritis    wrist, shoulder and neck   Atrial fibrillation (HCC)    pt reported he though the onset was maybe 3 years ago ( 2017 ) ,  no blood thinner per pt.   Atrial flutter (HCC)    Diabetes mellitus without complication (HCC)    Heart murmur    Hyperlipidemia    Hypertension    Sleep apnea    does not wear cpap   Past Surgical History:  Procedure Laterality Date   CARDIOVERSION N/A 07/26/2016   Procedure: CARDIOVERSION;  Surgeon: Yates Decamp, MD;  Location: [facility] ENDOSCOPY;  Service: Cardiovascular;  Laterality: N/A;   COLONOSCOPY     POLYPECTOMY     Current Outpatient Medications on File Prior to Visit  Medication Sig Dispense Refill   Cholecalciferol (VITAMIN D3 PO) Take by mouth.     CINNAMON PO Take 1,000 mg by mouth 2 (two) times daily.      Cyanocobalamin (VITAMIN B 12 PO) Take 1,000 mg by mouth daily.     fluticasone (FLONASE) 50 MCG/ACT nasal spray Place 2 sprays into both nostrils daily.     Omega-3 Fatty Acids (FISH OIL) 1200 MG CAPS Take 3 capsules by mouth daily.     Semaglutide, 2 MG/DOSE, (OZEMPIC, 2 MG/DOSE,) 8 MG/3ML SOPN Inject 2 mg into the skin once a week. 3 mL 3   tiZANidine (ZANAFLEX) 4 MG capsule TAKE 1 CAPSULE BY MOUTH 3 TIMES DAILY AS NEEDED FOR MUSCLE SPASMS. 60 capsule 0   vitamin C (ASCORBIC ACID) 500 MG tablet Take 500 mg by mouth daily.     No current facility-administered medications on file prior to visit.    Allergies  Allergen Reactions   Lipitor [Atorvastatin]     Myalgias   Penicillin G Rash   Penicillins Rash   Social History   Socioeconomic History   Marital status: Married    Spouse name: Not on file   Number of children: 1   Years of education: Not on file   Highest education level: 12th grade  Occupational History   Not on file  Tobacco Use   Smoking  status: Never   Smokeless tobacco: Former    Types: Chew    Quit date: 11/28/2000   Tobacco comments:    quit 2002  Vaping Use   Vaping status: Never Used  Substance and Sexual Activity   Alcohol use: Yes    Comment: Occasional   Drug use: No   Sexual activity: Not on file  Other Topics Concern   Not on file  Social History Narrative   Not on file   Social Drivers of Health   Financial Resource Strain: Low Risk  (12/08/2023)   Overall Financial Resource Strain (CARDIA)    Difficulty of Paying Living Expenses: Not hard at all  Food Insecurity: No Food Insecurity (12/08/2023)   Hunger Vital Sign    Worried About Running Out of Food in the Last Year: Never true    Ran Out of Food in the Last Year: Never true  Transportation Needs: No Transportation Needs (12/08/2023)   PRAPARE - Administrator, Civil Service (Medical): No    Lack of Transportation (Non-Medical): No  Physical Activity: Sufficiently Active (12/08/2023)   Exercise Vital Sign    Days of Exercise per Week: 4 days    Minutes of Exercise per Session: 150+ min  Stress: No Stress Concern Present (12/08/2023)   [employer] of Occupational Health - Occupational Stress Questionnaire    Feeling of Stress : Not at all  Social Connections: Unknown (12/08/2023)   Social Connection and Isolation Panel [NHANES]    Frequency of Communication with Friends and Family: More  than three times a week    Frequency of Social Gatherings with Friends and Family: More than three times a week    Attends Religious Services: Patient declined    Active Member of Clubs or Organizations: Patient declined    Attends Banker Meetings: Never    Marital Status: Married  Catering manager Violence: Not At Risk (06/15/2023)   Humiliation, Afraid, Rape, and Kick questionnaire    Fear of Current or Ex-Partner: No    Emotionally Abused: No    Physically Abused: No    Sexually Abused: No      Review of Systems  All other  systems reviewed and are negative.      Objective:   Physical Exam Vitals reviewed.  Constitutional:      General: He is not in acute distress.    Appearance: He is well-developed. He is not diaphoretic.  HENT:     Head: Normocephalic and atraumatic.     Right Ear: External ear normal.     Left Ear: External ear normal.     Nose: Nose normal.     Mouth/Throat:     Pharynx: No oropharyngeal exudate.  Eyes:     General: No scleral icterus.       Right eye: No discharge.        Left eye: No discharge.     Conjunctiva/sclera: Conjunctivae normal.     Pupils: Pupils are equal, round, and reactive to light.  Neck:     Thyroid: No thyromegaly.     Vascular: No JVD.     Trachea: No tracheal deviation.  Cardiovascular:     Rate and Rhythm: Normal rate and regular rhythm.     Heart sounds: Murmur heard.     No friction rub. No gallop.  Pulmonary:     Effort: Pulmonary effort is normal. No respiratory distress.     Breath sounds: Normal breath sounds. No stridor. No wheezing or rales.  Abdominal:     General: Bowel sounds are normal. There is no distension.     Palpations: Abdomen is soft. There is no mass.     Tenderness: There is no abdominal tenderness. There is no guarding or rebound.  Musculoskeletal:     Cervical back: Normal range of motion and neck supple.  Lymphadenopathy:     Cervical: No cervical adenopathy.  Skin:    General: Skin is warm.  Neurological:     Mental Status: He is alert and oriented to person, place, and time.     Cranial Nerves: No cranial nerve deficit.     Motor: No abnormal muscle tone.     Coordination: Coordination normal.     Deep Tendon Reflexes: Reflexes are normal and symmetric.  Psychiatric:        Behavior: Behavior normal.        Thought Content: Thought content normal.        Judgment: Judgment normal.         Assessment & Plan:  Pure hypercholesterolemia - Plan: rosuvastatin (CRESTOR) 40 MG tablet  Benign essential HTN -  Plan: amLODipine (NORVASC) 10 MG tablet, lisinopril (ZESTRIL) 40 MG tablet, spironolactone (ALDACTONE) 25 MG tablet  Paroxysmal atrial fibrillation (HCC) - Plan: lisinopril (ZESTRIL) 40 MG tablet  Tachycardia - Plan: lisinopril (ZESTRIL) 40 MG tablet, metoprolol succinate (TOPROL-XL) 50 MG 24 hr tablet  Diabetes mellitus without complication (HCC) - Plan: metFORMIN (GLUCOPHAGE) 1000 MG tablet, pioglitazone (ACTOS) 30 MG tablet  Seasonal allergies - Plan: levocetirizine (XYZAL  ALLERGY 24HR) 5 MG tablet Blood pressure, hemoglobin A1c, and cholesterol are outstanding.  We will make no changes in his medication at this time.  If he can lose a another 10 pounds, we can likely discontinue pioglitazone.  Recommended a diabetic eye exam.  Recommended Prevnar 20.  Recommended the shingles vaccine.  Recheck in 6 months

## 2023-12-31 ENCOUNTER — Other Ambulatory Visit: Payer: Self-pay | Admitting: Family Medicine

## 2023-12-31 DIAGNOSIS — E119 Type 2 diabetes mellitus without complications: Secondary | ICD-10-CM

## 2024-01-02 NOTE — Telephone Encounter (Signed)
 Requested Prescriptions  Pending Prescriptions Disp Refills   Semaglutide, 2 MG/DOSE, (OZEMPIC, 2 MG/DOSE,) 8 MG/3ML SOPN [Pharmacy Med Name: OZEMPIC 8 MG/3 ML (2 MG/DOSE)] 3 mL 3    Sig: INJECT 2 MG INTO THE SKIN ONCE A WEEK.     Endocrinology:  Diabetes - GLP-1 Receptor Agonists - semaglutide Failed - 01/02/2024  2:32 PM      Failed - HBA1C in normal range and within 180 days    Hgb A1c MFr Bld  Date Value Ref Range Status  12/12/2023 6.4 (H) <5.7 % of total Hgb Final    Comment:    For someone without known diabetes, a hemoglobin  A1c value between 5.7% and 6.4% is consistent with prediabetes and should be confirmed with a  follow-up test. . For someone with known diabetes, a value <7% indicates that their diabetes is well controlled. A1c targets should be individualized based on duration of diabetes, age, comorbid conditions, and other considerations. . This assay result is consistent with an increased risk of diabetes. . Currently, no consensus exists regarding use of hemoglobin A1c for diagnosis of diabetes for children. .          Failed - Valid encounter within last 6 months    Recent Outpatient Visits           2 weeks ago Pure hypercholesterolemia   [place] [facility] Donita Brooks, MD   8 months ago Controlled type 2 diabetes mellitus without complication, without long-term current use of insulin [facility])   [place] [facility] Donita Brooks, MD   11 months ago Senile cataract, unspecified age-related cataract type, unspecified laterality   [place] [facility] Pickard, Priscille Heidelberg, MD   1 year ago Urinary frequency    [place] [facility] Donita Brooks, MD   1 year ago Acute midline low back pain without sciatica    [facility] Donita Brooks, MD              Passed - Cr in normal range and within 360 days    Creat  Date Value Ref Range  Status  12/12/2023 0.79 0.70 - 1.35 mg/dL Final   Creatinine, Urine  Date Value Ref Range Status  12/12/2023 111 20 - 320 mg/dL Final

## 2024-01-08 ENCOUNTER — Encounter: Payer: Self-pay | Admitting: Family Medicine

## 2024-01-11 ENCOUNTER — Encounter: Payer: Self-pay | Admitting: Family Medicine

## 2024-01-11 ENCOUNTER — Ambulatory Visit: Admitting: Family Medicine

## 2024-01-11 VITALS — BP 114/74 | HR 81 | Ht 78.0 in | Wt 229.0 lb

## 2024-01-11 DIAGNOSIS — M545 Low back pain, unspecified: Secondary | ICD-10-CM | POA: Diagnosis not present

## 2024-01-11 MED ORDER — LIDOCAINE 5 % EX PTCH: 1.0000 | MEDICATED_PATCH | CUTANEOUS | 0 refills | Status: AC

## 2024-01-11 MED ORDER — TIZANIDINE HCL 4 MG PO CAPS: 4.0000 mg | ORAL_CAPSULE | Freq: Three times a day (TID) | ORAL | 0 refills | Status: AC | PRN

## 2024-01-11 NOTE — Assessment & Plan Note (Signed)
 Suspect lumbar strain. No red flags on exam. Encouraged to avoid lifting heavy objects and allow to heal. Start Tizanidine 4mg  TIDPRN and Lidocaine patch. May continue heat application and rest. Return to office if symptoms persist or worsen.

## 2024-01-11 NOTE — Progress Notes (Signed)
 Subjective:  HPI: Todd Ellis is a 68 y.o. male presenting on 01/11/2024 for Back Pain (PT comes in today for lower back pain that has been ongoing for about 2 wks. Ot states it hurts more when he is sitting for longer periods of time and seems to be relaxed more when up and moving around. Pt also informed us he has been doing a lot of heavy lifting here recently.)   Back Pain   Patient is in today for low back pain for 2 weeks after moving some heavy furniture. He continues to lift heavy items and has had a long road trip to [place] that has exacerbated the pain. The pain is across the bottom of his back bilaterally. Worse when going from sitting to standing. It does not radiate down his legs, he denies saddle or lower extremity numbness, incontinence of urine or stool, dysuria or urinary frequency, fever, chills, weight loss. No fall or injury. Has tried Tylenol.   Review of Systems  Musculoskeletal:  Positive for back pain.  All other systems reviewed and are negative.   Relevant past medical history reviewed and updated as indicated.   Past Medical History:  Diagnosis Date   Allergy    mild   Arthritis    wrist, shoulder and neck   Atrial fibrillation (HCC)    pt reported he though the onset was maybe 3 years ago ( 2017 ) , no blood thinner per pt.   Atrial flutter (HCC)    Diabetes mellitus without complication (HCC)    Heart murmur    Hyperlipidemia    Hypertension    Sleep apnea    does not wear cpap     Past Surgical History:  Procedure Laterality Date   CARDIOVERSION N/A 07/26/2016   Procedure: CARDIOVERSION;  Surgeon: Yates Decamp, MD;  Location: [facility] ENDOSCOPY;  Service: Cardiovascular;  Laterality: N/A;   COLONOSCOPY     POLYPECTOMY      Allergies and medications reviewed and updated.   Current Outpatient Medications:    lidocaine (LIDODERM) 5 %, Place 1 patch onto the skin daily. Remove & Discard patch within 12 hours or as directed by MD, Disp: 30  patch, Rfl: 0   amLODipine (NORVASC) 10 MG tablet, Take 1 tablet (10 mg total) by mouth daily., Disp: 90 tablet, Rfl: 1   Cholecalciferol (VITAMIN D3 PO), Take by mouth., Disp: , Rfl:    CINNAMON PO, Take 1,000 mg by mouth 2 (two) times daily. , Disp: , Rfl:    Cyanocobalamin (VITAMIN B 12 PO), Take 1,000 mg by mouth daily., Disp: , Rfl:    fluticasone (FLONASE) 50 MCG/ACT nasal spray, Place 2 sprays into both nostrils daily., Disp: , Rfl:    levocetirizine (XYZAL ALLERGY 24HR) 5 MG tablet, Take 1 tablet (5 mg total) by mouth every evening., Disp: 90 tablet, Rfl: 1   lisinopril (ZESTRIL) 40 MG tablet, Take 1 tablet (40 mg total) by mouth daily., Disp: 90 tablet, Rfl: 1   metFORMIN (GLUCOPHAGE) 1000 MG tablet, Take 1 tablet (1,000 mg total) by mouth 2 (two) times daily., Disp: 180 tablet, Rfl: 1   metoprolol succinate (TOPROL-XL) 50 MG 24 hr tablet, TAKE 1 TABLET BY MOUTH EVERY DAY WITH OR IMMEDIATELY FOLLOWING A MEAL, Disp: 90 tablet, Rfl: 1   Omega-3 Fatty Acids (FISH OIL) 1200 MG CAPS, Take 3 capsules by mouth daily., Disp: , Rfl:    pioglitazone (ACTOS) 30 MG tablet, Take 1 tablet (30 mg total) by mouth  daily. THIS REPLACES JANUVIA, Disp: 90 tablet, Rfl: 1   rosuvastatin (CRESTOR) 40 MG tablet, Take 0.5 tablets (20 mg total) by mouth daily., Disp: 45 tablet, Rfl: 1   Semaglutide, 2 MG/DOSE, (OZEMPIC, 2 MG/DOSE,) 8 MG/3ML SOPN, INJECT 2 MG INTO THE SKIN ONCE A WEEK., Disp: 3 mL, Rfl: 3   spironolactone (ALDACTONE) 25 MG tablet, Take 1 tablet (25 mg total) by mouth daily., Disp: 90 tablet, Rfl: 1   tiZANidine (ZANAFLEX) 4 MG capsule, Take 1 capsule (4 mg total) by mouth 3 (three) times daily as needed for muscle spasms., Disp: 30 capsule, Rfl: 0   vitamin C (ASCORBIC ACID) 500 MG tablet, Take 500 mg by mouth daily., Disp: , Rfl:   Allergies  Allergen Reactions   Lipitor [Atorvastatin]     Myalgias   Penicillin G Rash   Penicillins Rash    Objective:   BP 114/74   Pulse 81   Ht 6\' 6"   (1.981 m)   Wt 229 lb (103.9 kg)   SpO2 96%   BMI 26.46 kg/m      01/11/2024    2:17 PM 12/14/2023   11:36 AM 06/15/2023    8:32 AM  Vitals with BMI  Height 6\' 6"  6\' 6"  6\' 6"   Weight 229 lbs 230 lbs 10 oz 258 lbs  BMI 26.47 26.65 29.82  Systolic 114 120 --  Diastolic 74 66 --  Pulse 81 69      Physical Exam Vitals and nursing note reviewed.  Constitutional:      Appearance: Normal appearance. He is normal weight.  HENT:     Head: Normocephalic and atraumatic.  Musculoskeletal:     Lumbar back: Spasms and tenderness present. No bony tenderness.  Skin:    General: Skin is warm and dry.     Capillary Refill: Capillary refill takes less than 2 seconds.  Neurological:     General: No focal deficit present.     Mental Status: He is alert and oriented to person, place, and time. Mental status is at baseline.  Psychiatric:        Mood and Affect: Mood normal.        Behavior: Behavior normal.        Thought Content: Thought content normal.        Judgment: Judgment normal.     Assessment & Plan:  Acute midline low back pain without sciatica Assessment & Plan: Suspect lumbar strain. No red flags on exam. Encouraged to avoid lifting heavy objects and allow to heal. Start Tizanidine 4mg  TIDPRN and Lidocaine patch. May continue heat application and rest. Return to office if symptoms persist or worsen.   Other orders -     tiZANidine HCl; Take 1 capsule (4 mg total) by mouth 3 (three) times daily as needed for muscle spasms.  Dispense: 30 capsule; Refill: 0 -     Lidocaine; Place 1 patch onto the skin daily. Remove & Discard patch within 12 hours or as directed by MD  Dispense: 30 patch; Refill: 0     Follow up plan: Return if symptoms worsen or fail to improve.  Park Meo, FNP

## 2024-01-15 ENCOUNTER — Telehealth: Payer: Self-pay

## 2024-01-17 NOTE — Telephone Encounter (Signed)
 Todd Ellis (Key: [id number])  Your information has been sent to [employer].

## 2024-01-19 NOTE — Telephone Encounter (Signed)
 Fax received from [facility] and pt's Lidcaine patches were denied. Thank you.

## 2024-04-26 ENCOUNTER — Other Ambulatory Visit: Payer: Self-pay | Admitting: Family Medicine

## 2024-04-26 DIAGNOSIS — E119 Type 2 diabetes mellitus without complications: Secondary | ICD-10-CM

## 2024-06-08 ENCOUNTER — Other Ambulatory Visit: Payer: Self-pay | Admitting: Family Medicine

## 2024-06-08 DIAGNOSIS — I10 Essential (primary) hypertension: Secondary | ICD-10-CM

## 2024-06-10 NOTE — Telephone Encounter (Signed)
 Requested Prescriptions  Pending Prescriptions Disp Refills   spironolactone  (ALDACTONE ) 25 MG tablet [Pharmacy Med Name: SPIRONOLACTONE  25 MG TABLET] 90 tablet 0    Sig: TAKE 1 TABLET (25 MG TOTAL) BY MOUTH DAILY.     Cardiovascular: Diuretics - Aldosterone Antagonist Failed - 06/10/2024  1:24 PM      Failed - Cr in normal range and within 180 days    Creat  Date Value Ref Range Status  12/12/2023 0.79 0.70 - 1.35 mg/dL Final   Creatinine, Urine  Date Value Ref Range Status  12/12/2023 111 20 - 320 mg/dL Final         Failed - K in normal range and within 180 days    Potassium  Date Value Ref Range Status  12/12/2023 4.5 3.5 - 5.3 mmol/L Final         Failed - Na in normal range and within 180 days    Sodium  Date Value Ref Range Status  12/12/2023 141 135 - 146 mmol/L Final         Failed - eGFR is 30 or above and within 180 days    GFR, Est African American  Date Value Ref Range Status  11/30/2020 105 > OR = 60 mL/min/1.54m2 Final   GFR, Est Non African American  Date Value Ref Range Status  11/30/2020 91 > OR = 60 mL/min/1.35m2 Final   eGFR  Date Value Ref Range Status  12/12/2023 98 > OR = 60 mL/min/1.50m2 Final         Passed - Last BP in normal range    BP Readings from Last 1 Encounters:  01/11/24 114/74         Passed - Valid encounter within last 6 months    Recent Outpatient Visits           5 months ago Acute midline low back pain without sciatica   [place] [facility] Kayla Jeoffrey RAMAN, FNP   5 months ago Pure hypercholesterolemia   [place] Duanne Butler DASEN, MD   1 year ago Controlled type 2 diabetes mellitus without complication, without long-term current use of insulin [facility])   [place] [facility] Duanne Butler DASEN, MD   1 year ago Senile cataract, unspecified age-related cataract type, unspecified laterality   [place] [facility] Duanne Butler DASEN, MD    1 year ago Urinary frequency    [facility] Pickard, Butler DASEN, MD

## 2024-06-12 ENCOUNTER — Other Ambulatory Visit

## 2024-06-12 DIAGNOSIS — H5010 Unspecified exotropia: Secondary | ICD-10-CM | POA: Diagnosis not present

## 2024-06-12 DIAGNOSIS — I480 Paroxysmal atrial fibrillation: Secondary | ICD-10-CM | POA: Diagnosis not present

## 2024-06-12 DIAGNOSIS — I10 Essential (primary) hypertension: Secondary | ICD-10-CM

## 2024-06-12 DIAGNOSIS — E7800 Pure hypercholesterolemia, unspecified: Secondary | ICD-10-CM | POA: Diagnosis not present

## 2024-06-12 DIAGNOSIS — H5203 Hypermetropia, bilateral: Secondary | ICD-10-CM | POA: Diagnosis not present

## 2024-06-12 DIAGNOSIS — E119 Type 2 diabetes mellitus without complications: Secondary | ICD-10-CM

## 2024-06-12 LAB — HM DIABETES EYE EXAM

## 2024-06-13 ENCOUNTER — Ambulatory Visit: Payer: Self-pay | Admitting: Family Medicine

## 2024-06-13 LAB — COMPLETE METABOLIC PANEL WITHOUT GFR
AG Ratio: 1.9 (calc) (ref 1.0–2.5)
ALT: 16 U/L (ref 9–46)
AST: 14 U/L (ref 10–35)
Albumin: 4.2 g/dL (ref 3.6–5.1)
Alkaline phosphatase (APISO): 50 U/L (ref 35–144)
BUN: 11 mg/dL (ref 7–25)
CO2: 31 mmol/L (ref 20–32)
Calcium: 9.7 mg/dL (ref 8.6–10.3)
Chloride: 103 mmol/L (ref 98–110)
Creat: 0.82 mg/dL (ref 0.70–1.35)
Globulin: 2.2 g/dL (ref 1.9–3.7)
Glucose, Bld: 119 mg/dL — ABNORMAL HIGH (ref 65–99)
Potassium: 4.4 mmol/L (ref 3.5–5.3)
Sodium: 139 mmol/L (ref 135–146)
Total Bilirubin: 0.5 mg/dL (ref 0.2–1.2)
Total Protein: 6.4 g/dL (ref 6.1–8.1)

## 2024-06-13 LAB — CBC WITH DIFFERENTIAL/PLATELET
Absolute Lymphocytes: 1376 {cells}/uL (ref 850–3900)
Absolute Monocytes: 320 {cells}/uL (ref 200–950)
Basophils Absolute: 20 {cells}/uL (ref 0–200)
Basophils Relative: 0.5 %
Eosinophils Absolute: 68 {cells}/uL (ref 15–500)
Eosinophils Relative: 1.7 %
HCT: 42.4 % (ref 38.5–50.0)
Hemoglobin: 14.1 g/dL (ref 13.2–17.1)
MCH: 29.4 pg (ref 27.0–33.0)
MCHC: 33.3 g/dL (ref 32.0–36.0)
MCV: 88.5 fL (ref 80.0–100.0)
MPV: 10.7 fL (ref 7.5–12.5)
Monocytes Relative: 8 %
Neutro Abs: 2216 {cells}/uL (ref 1500–7800)
Neutrophils Relative %: 55.4 %
Platelets: 196 Thousand/uL (ref 140–400)
RBC: 4.79 Million/uL (ref 4.20–5.80)
RDW: 12.4 % (ref 11.0–15.0)
Total Lymphocyte: 34.4 %
WBC: 4 Thousand/uL (ref 3.8–10.8)

## 2024-06-13 LAB — MICROALBUMIN / CREATININE URINE RATIO
Creatinine, Urine: 107 mg/dL (ref 20–320)
Microalb Creat Ratio: 4 mg/g{creat} (ref <30)
Microalb, Ur: 0.4 mg/dL

## 2024-06-13 LAB — HEMOGLOBIN A1C
Hgb A1c MFr Bld: 6.1 % — ABNORMAL HIGH (ref <5.7)
Mean Plasma Glucose: 128 mg/dL
eAG (mmol/L): 7.1 mmol/L

## 2024-06-13 LAB — LIPID PANEL
Cholesterol: 138 mg/dL (ref <200)
HDL: 49 mg/dL (ref >=40)
LDL Cholesterol (Calc): 75 mg/dL
Non-HDL Cholesterol (Calc): 89 mg/dL (ref <130)
Total CHOL/HDL Ratio: 2.8 (calc) (ref <5.0)
Triglycerides: 60 mg/dL (ref <150)

## 2024-06-13 LAB — VITAMIN B12: Vitamin B-12: 408 pg/mL (ref 200–1100)

## 2024-06-14 ENCOUNTER — Encounter: Payer: Self-pay | Admitting: Family Medicine

## 2024-06-14 ENCOUNTER — Telehealth: Payer: Self-pay

## 2024-06-14 ENCOUNTER — Ambulatory Visit: Admitting: Family Medicine

## 2024-06-14 VITALS — BP 116/64 | HR 67 | Temp 97.8°F | Ht 78.0 in | Wt 225.6 lb

## 2024-06-14 DIAGNOSIS — Z23 Encounter for immunization: Secondary | ICD-10-CM | POA: Diagnosis not present

## 2024-06-14 DIAGNOSIS — I10 Essential (primary) hypertension: Secondary | ICD-10-CM | POA: Diagnosis not present

## 2024-06-14 DIAGNOSIS — E7800 Pure hypercholesterolemia, unspecified: Secondary | ICD-10-CM

## 2024-06-14 DIAGNOSIS — Z0000 Encounter for general adult medical examination without abnormal findings: Secondary | ICD-10-CM

## 2024-06-14 DIAGNOSIS — Z7984 Long term (current) use of oral hypoglycemic drugs: Secondary | ICD-10-CM

## 2024-06-14 DIAGNOSIS — E119 Type 2 diabetes mellitus without complications: Secondary | ICD-10-CM | POA: Diagnosis not present

## 2024-06-14 DIAGNOSIS — Z0001 Encounter for general adult medical examination with abnormal findings: Secondary | ICD-10-CM | POA: Diagnosis not present

## 2024-06-14 DIAGNOSIS — I480 Paroxysmal atrial fibrillation: Secondary | ICD-10-CM

## 2024-06-14 MED ORDER — MELOXICAM 15 MG PO TABS: 15.0000 mg | ORAL_TABLET | Freq: Every day | ORAL | 3 refills | Status: DC

## 2024-06-14 MED ORDER — TAMSULOSIN HCL 0.4 MG PO CAPS: 0.4000 mg | ORAL_CAPSULE | Freq: Every day | ORAL | 3 refills | Status: DC

## 2024-06-14 NOTE — Telephone Encounter (Signed)
 Copied from CRM #[id number]. Topic: Clinical - Request for Lab/Test Order >> Jun 14, 2024 11:00 AM Antwanette L wrote: Reason for CRM: Patient wants to know if he needs to come every 6 months for lab work? The patient has diabetes. The patient had lab work on 9/10(lab for cpe), 12/12/23, and 04/26/23(follow up on ozempic ). Patient is requesting a callback at [phone]

## 2024-06-14 NOTE — Progress Notes (Signed)
 Wt Readings from Last 3 Encounters:  06/14/24 225 lb 9.6 oz (102.3 kg)  01/11/24 229 lb (103.9 kg)  12/14/23 230 lb 9.6 oz (104.6 kg)     Subjective:    Patient ID: Todd Ellis, male    DOB: 12-30-1955, 68 y.o.   MRN: [id number]  Patient is here today for his complete physical exam.  His last colonoscopy was in 2023 and due to the presence of numerous polyps they recommended a repeat colonoscopy in 2026.  He is also due for prostate cancer screening in March of next year.  His PSA was checked in March of this year and is in the.  He is due for a flu shot which he received today.  He is also due for RSV and COVID.  His most recent lab work is listed below.  He does complain of pain in his left shoulder.  He also reports polyuria at night with decreased urinary stream, hesitancy, and weak flow.  He denies any dysuria or hematuria.  He has tried medicines for overactive bladder in the past with no success.  He is interested in potentially stopping some of his diabetes medicine given how good his hemoglobin A1c was Immunization History  Administered Date(s) Administered   INFLUENZA, HIGH DOSE SEASONAL PF 07/18/2022, 06/14/2024   Influenza Inj Mdck Quad Pf 06/14/2018   Influenza,inj,Quad PF,6+ Mos 07/20/2017, 06/14/2018, 07/11/2019, 07/29/2021   PFIZER(Purple Top)SARS-COV-2 Vaccination 01/28/2020, 02/18/2020   Pfizer Covid-19 Vaccine Bivalent Booster 11yrs & up 08/03/2021   Pfizer(Comirnaty)Fall Seasonal Vaccine 12 years and older 07/18/2022   Pneumococcal Polysaccharide-23 04/16/2018   Td 02/03/2006   Tdap 02/23/2016   Zoster Recombinant(Shingrix ) 03/12/2020, 12/02/2020, 07/29/2023    Scanned Document on 06/12/2024  Component Date Value Ref Range Status   HM Diabetic Eye Exam 06/12/2024 No Retinopathy  No Retinopathy Final   Abstracted by HIM  Lab on 06/12/2024  Component Date Value Ref Range Status   WBC 06/12/2024 4.0  3.8 - 10.8 Thousand/uL Final   RBC 06/12/2024 4.79  4.20 -  5.80 Million/uL Final   Hemoglobin 06/12/2024 14.1  13.2 - 17.1 g/dL Final   HCT 90/89/7974 42.4  38.5 - 50.0 % Final   MCV 06/12/2024 88.5  80.0 - 100.0 fL Final   MCH 06/12/2024 29.4  27.0 - 33.0 pg Final   MCHC 06/12/2024 33.3  32.0 - 36.0 g/dL Final   Comment: For adults, a slight decrease in the calculated MCHC value (in the range of 30 to 32 g/dL) is most likely not clinically significant; however, it should be interpreted with caution in correlation with other red cell parameters and the patient's clinical condition.    RDW 06/12/2024 12.4  11.0 - 15.0 % Final   Platelets 06/12/2024 196  140 - 400 Thousand/uL Final   MPV 06/12/2024 10.7  7.5 - 12.5 fL Final   Neutro Abs 06/12/2024 2,216  1,500 - 7,800 cells/uL Final   Absolute Lymphocytes 06/12/2024 1,376  850 - 3,900 cells/uL Final   Absolute Monocytes 06/12/2024 320  200 - 950 cells/uL Final   Eosinophils Absolute 06/12/2024 68  15 - 500 cells/uL Final   Basophils Absolute 06/12/2024 20  0 - 200 cells/uL Final   Neutrophils Relative % 06/12/2024 55.4  % Final   Total Lymphocyte 06/12/2024 34.4  % Final   Monocytes Relative 06/12/2024 8.0  % Final   Eosinophils Relative 06/12/2024 1.7  % Final   Basophils Relative 06/12/2024 0.5  % Final   Glucose, Bld 06/12/2024  119 (H)  65 - 99 mg/dL Final   Comment: .            Fasting reference interval . For someone without known diabetes, a glucose value between 100 and 125 mg/dL is consistent with prediabetes and should be confirmed with a follow-up test. .    BUN 06/12/2024 11  7 - 25 mg/dL Final   Creat 90/89/7974 0.82  0.70 - 1.35 mg/dL Final   BUN/Creatinine Ratio 06/12/2024 SEE NOTE:  6 - 22 (calc) Final   Comment:    Not Reported: BUN and Creatinine are within    reference range. .    Sodium 06/12/2024 139  135 - 146 mmol/L Final   Potassium 06/12/2024 4.4  3.5 - 5.3 mmol/L Final   Chloride 06/12/2024 103  98 - 110 mmol/L Final   CO2 06/12/2024 31  20 - 32 mmol/L  Final   Calcium  06/12/2024 9.7  8.6 - 10.3 mg/dL Final   Total Protein 90/89/7974 6.4  6.1 - 8.1 g/dL Final   Albumin 90/89/7974 4.2  3.6 - 5.1 g/dL Final   Globulin 90/89/7974 2.2  1.9 - 3.7 g/dL (calc) Final   AG Ratio 06/12/2024 1.9  1.0 - 2.5 (calc) Final   Total Bilirubin 06/12/2024 0.5  0.2 - 1.2 mg/dL Final   Alkaline phosphatase (APISO) 06/12/2024 50  35 - 144 U/L Final   AST 06/12/2024 14  10 - 35 U/L Final   ALT 06/12/2024 16  9 - 46 U/L Final   Hgb A1c MFr Bld 06/12/2024 6.1 (H)  <5.7 % Final   Comment: For someone without known diabetes, a hemoglobin  A1c value between 5.7% and 6.4% is consistent with prediabetes and should be confirmed with a  follow-up test. . For someone with known diabetes, a value <7% indicates that their diabetes is well controlled. A1c targets should be individualized based on duration of diabetes, age, comorbid conditions, and other considerations. . This assay result is consistent with an increased risk of diabetes. . Currently, no consensus exists regarding use of hemoglobin A1c for diagnosis of diabetes for children. .    Mean Plasma Glucose 06/12/2024 128  mg/dL Final   eAG (mmol/L) 90/89/7974 7.1  mmol/L Final   Cholesterol 06/12/2024 138  <200 mg/dL Final   HDL 90/89/7974 49  > OR = 40 mg/dL Final   Triglycerides 90/89/7974 60  <150 mg/dL Final   LDL Cholesterol (Calc) 06/12/2024 75  mg/dL (calc) Final   Comment: Reference range: <100 . Desirable range <100 mg/dL for primary prevention;   <70 mg/dL for patients with CHD or diabetic patients  with > or = 2 CHD risk factors. SABRA LDL-C is now calculated using the Martin-Hopkins  calculation, which is a validated novel method providing  better accuracy than the Friedewald equation in the  estimation of LDL-C.  Gladis APPLETHWAITE et al. SANDREA. 7986;689(80): 2061-2068  (http://education.[facility].com/faq/FAQ164)    Total CHOL/HDL Ratio 06/12/2024 2.8  <4.9 (calc) Final   Non-HDL  Cholesterol (Calc) 06/12/2024 89  <130 mg/dL (calc) Final   Comment: For patients with diabetes plus 1 major ASCVD risk  factor, treating to a non-HDL-C goal of <100 mg/dL  (LDL-C of <29 mg/dL) is considered a therapeutic  option.    Creatinine, Urine 06/12/2024 107  20 - 320 mg/dL Final   Microalb, Ur 90/89/7974 0.4  mg/dL Final   Comment: Reference Range Not established    Microalb Creat Ratio 06/12/2024 4  <30 mg/g creat Final  Comment: . The ADA defines abnormalities in albumin excretion as follows: SABRA Albuminuria Category        Result (mg/g creatinine) . Normal to Mildly increased   <30 Moderately increased         30-299  Severely increased           > OR = 300 . The ADA recommends that at least two of three specimens collected within a 3-6 month period be abnormal before considering a patient to be within a diagnostic category.    Vitamin B-12 06/12/2024 408  200 - 1,100 pg/mL Final     Past Medical History:  Diagnosis Date   Allergy    mild   Arthritis    wrist, shoulder and neck   Atrial fibrillation (HCC)    pt reported he though the onset was maybe 3 years ago ( 2017 ) , no blood thinner per pt.   Atrial flutter (HCC)    Diabetes mellitus without complication (HCC)    Heart murmur    Hyperlipidemia    Hypertension    Sleep apnea    does not wear cpap   Past Surgical History:  Procedure Laterality Date   CARDIOVERSION N/A 07/26/2016   Procedure: CARDIOVERSION;  Surgeon: Gordy Bergamo, MD;  Location: [facility] ENDOSCOPY;  Service: Cardiovascular;  Laterality: N/A;   COLONOSCOPY     POLYPECTOMY     Current Outpatient Medications on File Prior to Visit  Medication Sig Dispense Refill   amLODipine  (NORVASC ) 10 MG tablet Take 1 tablet (10 mg total) by mouth daily. 90 tablet 1   Cholecalciferol (VITAMIN D3 PO) Take by mouth.     CINNAMON PO Take 1,000 mg by mouth 2 (two) times daily.      Cyanocobalamin  (VITAMIN B 12 PO) Take 1,000 mg by mouth daily.      fluticasone  (FLONASE ) 50 MCG/ACT nasal spray Place 2 sprays into both nostrils daily.     levocetirizine (XYZAL  ALLERGY 24HR) 5 MG tablet Take 1 tablet (5 mg total) by mouth every evening. 90 tablet 1   lidocaine  (LIDODERM ) 5 % Place 1 patch onto the skin daily. Remove & Discard patch within 12 hours or as directed by MD 30 patch 0   lisinopril  (ZESTRIL ) 40 MG tablet Take 1 tablet (40 mg total) by mouth daily. 90 tablet 1   metFORMIN  (GLUCOPHAGE ) 1000 MG tablet Take 1 tablet (1,000 mg total) by mouth 2 (two) times daily. 180 tablet 1   metoprolol  succinate (TOPROL -XL) 50 MG 24 hr tablet TAKE 1 TABLET BY MOUTH EVERY DAY WITH OR IMMEDIATELY FOLLOWING A MEAL 90 tablet 1   Omega-3 Fatty Acids (FISH OIL) 1200 MG CAPS Take 3 capsules by mouth daily.     pioglitazone  (ACTOS ) 30 MG tablet Take 1 tablet (30 mg total) by mouth daily. THIS REPLACES JANUVIA  90 tablet 1   rosuvastatin  (CRESTOR ) 40 MG tablet Take 0.5 tablets (20 mg total) by mouth daily. 45 tablet 1   Semaglutide , 2 MG/DOSE, (OZEMPIC , 2 MG/DOSE,) 8 MG/3ML SOPN INJECT 2 MG INTO THE SKIN ONCE A WEEK. 3 mL 3   spironolactone  (ALDACTONE ) 25 MG tablet TAKE 1 TABLET (25 MG TOTAL) BY MOUTH DAILY. 90 tablet 0   tiZANidine  (ZANAFLEX ) 4 MG capsule Take 1 capsule (4 mg total) by mouth 3 (three) times daily as needed for muscle spasms. 30 capsule 0   vitamin C (ASCORBIC ACID) 500 MG tablet Take 500 mg by mouth daily.     No current facility-administered medications  on file prior to visit.    Allergies  Allergen Reactions   Lipitor [Atorvastatin]     Myalgias   Penicillin G Rash   Penicillins Rash   Social History   Socioeconomic History   Marital status: Married    Spouse name: Not on file   Number of children: 1   Years of education: Not on file   Highest education level: 12th grade  Occupational History   Not on file  Tobacco Use   Smoking status: Never   Smokeless tobacco: Former    Types: Chew    Quit date: 11/28/2000   Tobacco  comments:    quit 2002  Vaping Use   Vaping status: Never Used  Substance and Sexual Activity   Alcohol use: Yes    Comment: Occasional   Drug use: No   Sexual activity: Not on file  Other Topics Concern   Not on file  Social History Narrative   Not on file   Social Drivers of Health   Financial Resource Strain: Low Risk  (06/10/2024)   Overall Financial Resource Strain (CARDIA)    Difficulty of Paying Living Expenses: Not hard at all  Food Insecurity: No Food Insecurity (06/10/2024)   Hunger Vital Sign    Worried About Running Out of Food in the Last Year: Never true    Ran Out of Food in the Last Year: Never true  Transportation Needs: No Transportation Needs (06/10/2024)   PRAPARE - Administrator, Civil Service (Medical): No    Lack of Transportation (Non-Medical): No  Physical Activity: Sufficiently Active (06/10/2024)   Exercise Vital Sign    Days of Exercise per Week: 4 days    Minutes of Exercise per Session: 40 min  Stress: No Stress Concern Present (06/10/2024)   [employer] of Occupational Health - Occupational Stress Questionnaire    Feeling of Stress: Not at all  Social Connections: Unknown (06/10/2024)   Social Connection and Isolation Panel    Frequency of Communication with Friends and Family: More than three times a week    Frequency of Social Gatherings with Friends and Family: More than three times a week    Attends Religious Services: Patient declined    Database administrator or Organizations: Patient declined    Attends Banker Meetings: Not on file    Marital Status: Married  Intimate Partner Violence: Not At Risk (06/15/2023)   Humiliation, Afraid, Rape, and Kick questionnaire    Fear of Current or Ex-Partner: No    Emotionally Abused: No    Physically Abused: No    Sexually Abused: No      Review of Systems  All other systems reviewed and are negative.      Objective:   Physical Exam Vitals reviewed.   Constitutional:      General: He is not in acute distress.    Appearance: He is well-developed. He is not diaphoretic.  HENT:     Head: Normocephalic and atraumatic.     Right Ear: External ear normal.     Left Ear: External ear normal.     Nose: Nose normal.     Mouth/Throat:     Pharynx: No oropharyngeal exudate.  Eyes:     General: No scleral icterus.       Right eye: No discharge.        Left eye: No discharge.     Conjunctiva/sclera: Conjunctivae normal.     Pupils: Pupils are equal,  round, and reactive to light.  Neck:     Thyroid: No thyromegaly.     Vascular: No JVD.     Trachea: No tracheal deviation.   Cardiovascular:     Rate and Rhythm: Normal rate and regular rhythm.     Heart sounds: Murmur heard.     No friction rub. No gallop.  Pulmonary:     Effort: Pulmonary effort is normal. No respiratory distress.     Breath sounds: Normal breath sounds. No stridor. No wheezing or rales.  Abdominal:     General: Bowel sounds are normal. There is no distension.     Palpations: Abdomen is soft. There is no mass.     Tenderness: There is no abdominal tenderness. There is no guarding or rebound.  Musculoskeletal:     Cervical back: Normal range of motion.  Lymphadenopathy:     Cervical: No cervical adenopathy.  Skin:    General: Skin is warm.  Neurological:     Mental Status: He is alert and oriented to person, place, and time.     Cranial Nerves: No cranial nerve deficit.     Motor: No abnormal muscle tone.     Coordination: Coordination normal.     Deep Tendon Reflexes: Reflexes are normal and symmetric.  Psychiatric:        Behavior: Behavior normal.        Thought Content: Thought content normal.        Judgment: Judgment normal.         Assessment & Plan:  Diabetes mellitus without complication (HCC) - Plan: Flu vaccine HIGH DOSE PF(Fluzone Trivalent)  Benign essential HTN - Plan: Flu vaccine HIGH DOSE PF(Fluzone Trivalent)  Flu vaccine need - Plan:  Flu vaccine HIGH DOSE PF(Fluzone Trivalent)  Paroxysmal atrial fibrillation (HCC)  Pure hypercholesterolemia  Encounter for Medicare annual wellness exam  Blood pressure today is excellent.  Physical exam today is normal.  A1c is outstanding at 6.1.  We will try stopping pioglitazone  and recheck his lab work in 6 months.  PSA and colon cancer screening are up-to-date.  We will trial meloxicam  15 mg daily for shoulder pain.  I cautioned the patient about renal toxicity and stomach irritation.  Will also try the patient on Flomax  0.4 mg at night for possible lower urinary tract symptoms.  Patient received his flu shot today.  I recommended he get COVID and RSV.  The remainder of his vaccinations are up-to-date.  Colonoscopy is up-to-date.  Cholesterol is excellent.  Patient will try holding pioglitazone  and we will recheck his diabetes test in 6 months to see if he is able to maintain his sugar off the pioglitazone  given his weight loss

## 2024-07-03 ENCOUNTER — Other Ambulatory Visit: Payer: Self-pay | Admitting: Family Medicine

## 2024-07-03 DIAGNOSIS — I10 Essential (primary) hypertension: Secondary | ICD-10-CM

## 2024-07-03 DIAGNOSIS — J302 Other seasonal allergic rhinitis: Secondary | ICD-10-CM

## 2024-07-03 DIAGNOSIS — E7800 Pure hypercholesterolemia, unspecified: Secondary | ICD-10-CM

## 2024-07-04 ENCOUNTER — Telehealth: Payer: Self-pay

## 2024-07-04 ENCOUNTER — Other Ambulatory Visit: Payer: Self-pay

## 2024-07-04 DIAGNOSIS — R000 Tachycardia, unspecified: Secondary | ICD-10-CM

## 2024-07-04 MED ORDER — METOPROLOL SUCCINATE ER 50 MG PO TB24: ORAL_TABLET | ORAL | 1 refills | Status: AC

## 2024-07-04 NOTE — Telephone Encounter (Signed)
 Sent in medication

## 2024-07-04 NOTE — Telephone Encounter (Signed)
 Prescription Request  07/04/2024  LOV: 06/14/24  What is the name of the medication or equipment? metoprolol  succinate (TOPROL -XL) 50 MG 24 hr tablet [[id number]]   Have you contacted your pharmacy to request a refill? Yes   Which pharmacy would you like this sent to?  [id number] [place], [place] - [address] [place] [address] Phone: [phone] Fax: [phone]    Patient notified that their request is being sent to the clinical staff for review and that they should receive a response within 2 business days.   Please advise at [facility] [phone]

## 2024-07-18 ENCOUNTER — Other Ambulatory Visit: Payer: Self-pay

## 2024-07-18 ENCOUNTER — Telehealth: Payer: Self-pay

## 2024-07-18 DIAGNOSIS — Z23 Encounter for immunization: Secondary | ICD-10-CM | POA: Diagnosis not present

## 2024-07-18 DIAGNOSIS — E119 Type 2 diabetes mellitus without complications: Secondary | ICD-10-CM

## 2024-07-18 DIAGNOSIS — X58XXXA Exposure to other specified factors, initial encounter: Secondary | ICD-10-CM | POA: Diagnosis not present

## 2024-07-18 DIAGNOSIS — T148XXA Other injury of unspecified body region, initial encounter: Secondary | ICD-10-CM | POA: Diagnosis not present

## 2024-07-18 DIAGNOSIS — S61431A Puncture wound without foreign body of right hand, initial encounter: Secondary | ICD-10-CM | POA: Diagnosis not present

## 2024-07-18 MED ORDER — METFORMIN HCL 1000 MG PO TABS: 1000.0000 mg | ORAL_TABLET | Freq: Two times a day (BID) | ORAL | 1 refills | Status: AC

## 2024-07-18 NOTE — Telephone Encounter (Signed)
 Sent in medication

## 2024-07-18 NOTE — Telephone Encounter (Signed)
 Prescription Request  07/18/2024  LOV: 06/14/24  What is the name of the medication or equipment? metFORMIN  (GLUCOPHAGE ) 1000 MG tablet [[id number]]   Have you contacted your pharmacy to request a refill? Yes   Which pharmacy would you like this sent to?  [id number] [place], [place] - [address] [place] [address] Phone: [phone] Fax: [phone]    Patient notified that their request is being sent to the clinical staff for review and that they should receive a response within 2 business days.   Please advise at [facility] [phone]

## 2024-07-19 ENCOUNTER — Telehealth: Payer: Self-pay

## 2024-07-19 NOTE — Telephone Encounter (Signed)
 Pt has an appt with pcp on 10/20.

## 2024-07-19 NOTE — Telephone Encounter (Signed)
 Copied from CRM [id number]. Topic: Appointments - Scheduling Inquiry for Clinic >> Jul 19, 2024  8:32 AM Donna BRAVO wrote: Patient stated he will stop by the office this morning to get this taken care of.    Reason for CRM: Urgent care note: CHIEF COMPLAINT: Chief Complaint  Patient presents with  Hand Injury  Pt presents with a hand injury due to getting punctured by a catfish's horn. The accident happened 2 days ago. Pt took Tylenol, cleaned it with peroxide, and applied neosporin. Pt's right hand is swollen.   Patient asking to speak with Dr Duanne nurse  ----------------------------------------------------------------------- From previous Reason for Contact - Scheduling: Patient/patient representative is calling to schedule an appointment. Refer to attachments for appointment information.  Patient calling wanting to schedule appt for follow up appt right hand puncture   Patient asking to speak with Dr Duanne nurse

## 2024-07-22 ENCOUNTER — Ambulatory Visit (INDEPENDENT_AMBULATORY_CARE_PROVIDER_SITE_OTHER): Admitting: Family Medicine

## 2024-07-22 ENCOUNTER — Encounter: Payer: Self-pay | Admitting: Family Medicine

## 2024-07-22 VITALS — BP 126/70 | HR 105 | Temp 98.0°F | Ht 78.0 in | Wt 219.0 lb

## 2024-07-22 DIAGNOSIS — L03113 Cellulitis of right upper limb: Secondary | ICD-10-CM | POA: Diagnosis not present

## 2024-07-22 MED ORDER — PREDNISONE 20 MG PO TABS: ORAL_TABLET | ORAL | 0 refills | Status: AC

## 2024-07-22 MED ORDER — SULFAMETHOXAZOLE-TRIMETHOPRIM 800-160 MG PO TABS: 1.0000 | ORAL_TABLET | Freq: Two times a day (BID) | ORAL | 0 refills | Status: AC

## 2024-07-22 NOTE — Progress Notes (Signed)
 Subjective:    Patient ID: Todd Ellis, male    DOB: 08-03-1956, 68 y.o.   MRN: [id number]  Last Saturday, the patient was fishing when he suffered a puncture wound to the webspace between his right thumb and his right index finger.  The hand became extremely swollen and painful.  He has significant bruising in his hand tracking up his right forearm.  He went to an urgent care where he was given antibiotics including Augmentin.  The swelling is significantly better.  However he has only completed 4 days of antibiotics and he had to stop the antibiotic yesterday due to a rash developing in his axilla and along his medial thighs.  He has a morbilliform rash forming in his axilla along his lower abdomen and on his medial thighs which has been definitely a drug reaction.  Patient has a history of allergies to penicillin  Past Medical History:  Diagnosis Date   Allergy    mild   Arthritis    wrist, shoulder and neck   Atrial fibrillation (HCC)    pt reported he though the onset was maybe 3 years ago ( 2017 ) , no blood thinner per pt.   Atrial flutter (HCC)    Diabetes mellitus without complication (HCC)    Heart murmur    Hyperlipidemia    Hypertension    Sleep apnea    does not wear cpap   Past Surgical History:  Procedure Laterality Date   CARDIOVERSION N/A 07/26/2016   Procedure: CARDIOVERSION;  Surgeon: Gordy Bergamo, MD;  Location: [facility] ENDOSCOPY;  Service: Cardiovascular;  Laterality: N/A;   COLONOSCOPY     POLYPECTOMY     Current Outpatient Medications on File Prior to Visit  Medication Sig Dispense Refill   amLODipine  (NORVASC ) 10 MG tablet TAKE 1 TABLET BY MOUTH EVERY DAY 90 tablet 1   Cholecalciferol (VITAMIN D3 PO) Take by mouth.     CINNAMON PO Take 1,000 mg by mouth 2 (two) times daily.      Cyanocobalamin  (VITAMIN B 12 PO) Take 1,000 mg by mouth daily.     fluticasone  (FLONASE ) 50 MCG/ACT nasal spray Place 2 sprays into both nostrils daily.     levocetirizine (XYZAL )  5 MG tablet TAKE 1 TABLET BY MOUTH EVERY DAY IN THE EVENING 90 tablet 1   lidocaine  (LIDODERM ) 5 % Place 1 patch onto the skin daily. Remove & Discard patch within 12 hours or as directed by MD 30 patch 0   lisinopril  (ZESTRIL ) 40 MG tablet Take 1 tablet (40 mg total) by mouth daily. 90 tablet 1   meloxicam  (MOBIC ) 15 MG tablet Take 1 tablet (15 mg total) by mouth daily. 30 tablet 3   metFORMIN  (GLUCOPHAGE ) 1000 MG tablet Take 1 tablet (1,000 mg total) by mouth 2 (two) times daily. 180 tablet 1   metoprolol  succinate (TOPROL -XL) 50 MG 24 hr tablet TAKE 1 TABLET BY MOUTH EVERY DAY WITH OR IMMEDIATELY FOLLOWING A MEAL 90 tablet 1   Omega-3 Fatty Acids (FISH OIL) 1200 MG CAPS Take 3 capsules by mouth daily.     pioglitazone  (ACTOS ) 30 MG tablet Take 1 tablet (30 mg total) by mouth daily. THIS REPLACES JANUVIA  90 tablet 1   rosuvastatin  (CRESTOR ) 40 MG tablet TAKE 1/2 TABLET BY MOUTH DAILY 45 tablet 1   Semaglutide , 2 MG/DOSE, (OZEMPIC , 2 MG/DOSE,) 8 MG/3ML SOPN INJECT 2 MG INTO THE SKIN ONCE A WEEK. 3 mL 3   spironolactone  (ALDACTONE ) 25 MG tablet TAKE  1 TABLET (25 MG TOTAL) BY MOUTH DAILY. 90 tablet 0   tamsulosin  (FLOMAX ) 0.4 MG CAPS capsule Take 1 capsule (0.4 mg total) by mouth daily. 30 capsule 3   tiZANidine  (ZANAFLEX ) 4 MG capsule Take 1 capsule (4 mg total) by mouth 3 (three) times daily as needed for muscle spasms. 30 capsule 0   vitamin C (ASCORBIC ACID) 500 MG tablet Take 500 mg by mouth daily.     No current facility-administered medications on file prior to visit.    Allergies  Allergen Reactions   Lipitor [Atorvastatin]     Myalgias   Penicillin G Rash   Penicillins Rash   Social History   Socioeconomic History   Marital status: Married    Spouse name: Not on file   Number of children: 1   Years of education: Not on file   Highest education level: 12th grade  Occupational History   Not on file  Tobacco Use   Smoking status: Never   Smokeless tobacco: Former    Types:  Chew    Quit date: 11/28/2000   Tobacco comments:    quit 2002  Vaping Use   Vaping status: Never Used  Substance and Sexual Activity   Alcohol use: Yes    Comment: Occasional   Drug use: No   Sexual activity: Not on file  Other Topics Concern   Not on file  Social History Narrative   Not on file   Social Drivers of Health   Financial Resource Strain: Low Risk  (07/20/2024)   Overall Financial Resource Strain (CARDIA)    Difficulty of Paying Living Expenses: Not hard at all  Food Insecurity: No Food Insecurity (07/20/2024)   Hunger Vital Sign    Worried About Running Out of Food in the Last Year: Never true    Ran Out of Food in the Last Year: Never true  Transportation Needs: No Transportation Needs (07/20/2024)   PRAPARE - Administrator, Civil Service (Medical): No    Lack of Transportation (Non-Medical): No  Physical Activity: Sufficiently Active (07/20/2024)   Exercise Vital Sign    Days of Exercise per Week: 6 days    Minutes of Exercise per Session: 80 min  Stress: No Stress Concern Present (06/10/2024)   [employer] of Occupational Health - Occupational Stress Questionnaire    Feeling of Stress: Not at all  Social Connections: Unknown (07/20/2024)   Social Connection and Isolation Panel    Frequency of Communication with Friends and Family: More than three times a week    Frequency of Social Gatherings with Friends and Family: More than three times a week    Attends Religious Services: Patient declined    Database administrator or Organizations: Patient declined    Attends Banker Meetings: Not on file    Marital Status: Married  Intimate Partner Violence: Not At Risk (06/15/2023)   Humiliation, Afraid, Rape, and Kick questionnaire    Fear of Current or Ex-Partner: No    Emotionally Abused: No    Physically Abused: No    Sexually Abused: No      Review of Systems  All other systems reviewed and are negative.      Objective:    Physical Exam Vitals reviewed.  Constitutional:      General: He is not in acute distress.    Appearance: He is well-developed. He is not diaphoretic.  HENT:     Head: Normocephalic and atraumatic.  Mouth/Throat:     Pharynx: No oropharyngeal exudate.  Eyes:     General: No scleral icterus.       Right eye: No discharge.        Left eye: No discharge.     Conjunctiva/sclera: Conjunctivae normal.     Pupils: Pupils are equal, round, and reactive to light.  Neck:     Thyroid: No thyromegaly.     Vascular: No JVD.     Trachea: No tracheal deviation.  Cardiovascular:     Rate and Rhythm: Normal rate and regular rhythm.     Heart sounds: Murmur heard.     No friction rub. No gallop.  Pulmonary:     Effort: Pulmonary effort is normal. No respiratory distress.     Breath sounds: Normal breath sounds. No stridor. No wheezing or rales.  Abdominal:     General: Bowel sounds are normal. There is no distension.     Palpations: Abdomen is soft. There is no mass.     Tenderness: There is no abdominal tenderness. There is no guarding or rebound.  Musculoskeletal:     Cervical back: Normal range of motion and neck supple.  Lymphadenopathy:     Cervical: No cervical adenopathy.  Skin:    General: Skin is warm.     Findings: Erythema and rash present.      Neurological:     Mental Status: He is alert and oriented to person, place, and time.     Cranial Nerves: No cranial nerve deficit.     Motor: No abnormal muscle tone.     Coordination: Coordination normal.     Deep Tendon Reflexes: Reflexes are normal and symmetric.  Psychiatric:        Behavior: Behavior normal.        Thought Content: Thought content normal.        Judgment: Judgment normal.   Red circles indicate location of morbilliform rash.  Thankfully the right hand looks like it is improving.  He has bruising in the webspace between his right thumb and his right index finger as well as yellow discoloration and swelling  from bruising on the dorsum of his right forearm and on the volar surface of his right forearm.  Each finger is neurovascularly intact in the right hand.  He has good range of motion without any pain.  He can make a fist without any pain.      Assessment & Plan:  Cellulitis of right hand Patient had cellulitis in the right hand which seems to be responding well to Augmentin.  I will transition the patient to Bactrim  to cover gram-positive's and gram-negative's as well as staph.  Discontinue Augmentin due to the drug reaction.  I gave the patient a prednisone  in case the rash worsens however I instructed him not to take the prednisone  unless the rash worsens

## 2024-08-01 ENCOUNTER — Encounter: Payer: Self-pay | Admitting: Family Medicine

## 2024-08-04 ENCOUNTER — Other Ambulatory Visit: Payer: Self-pay | Admitting: Family Medicine

## 2024-08-04 DIAGNOSIS — I480 Paroxysmal atrial fibrillation: Secondary | ICD-10-CM

## 2024-08-04 DIAGNOSIS — I10 Essential (primary) hypertension: Secondary | ICD-10-CM

## 2024-08-04 DIAGNOSIS — R000 Tachycardia, unspecified: Secondary | ICD-10-CM

## 2024-08-14 ENCOUNTER — Encounter: Payer: Self-pay | Admitting: *Deleted

## 2024-08-14 NOTE — Progress Notes (Signed)
 Todd Ellis                                          MRN: [id number]   08/14/2024   The [facility] Quality Team Specialist reviewed this patient medical record for the purposes of chart review for care gap closure. The following were reviewed: abstraction for care gap closure-controlling blood pressure, diabetic eye exam, glycemic status assessment, and kidney health evaluation for diabetes:eGFR  and uACR.    [facility] Quality Team

## 2024-08-15 ENCOUNTER — Other Ambulatory Visit: Payer: Self-pay | Admitting: Family Medicine

## 2024-08-15 DIAGNOSIS — E119 Type 2 diabetes mellitus without complications: Secondary | ICD-10-CM

## 2024-09-02 ENCOUNTER — Telehealth: Payer: Self-pay

## 2024-09-02 DIAGNOSIS — E119 Type 2 diabetes mellitus without complications: Secondary | ICD-10-CM

## 2024-09-02 NOTE — Telephone Encounter (Signed)
 Prescription Request  09/02/2024  LOV: 07/22/24  What is the name of the medication or equipment? pioglitazone  (ACTOS ) 30 MG tablet [[id number]]   Have you contacted your pharmacy to request a refill? Yes   Which pharmacy would you like this sent to?  [id number] [address], [place] - [address] [place] [address] Phone: [phone] Fax: [phone]    Patient notified that their request is being sent to the clinical staff for review and that they should receive a response within 2 business days.   Please advise at [facility] [phone]

## 2024-09-05 MED ORDER — PIOGLITAZONE HCL 30 MG PO TABS: 30.0000 mg | ORAL_TABLET | Freq: Every day | ORAL | 1 refills | Status: AC

## 2024-09-05 NOTE — Telephone Encounter (Signed)
 Requested Prescriptions  Pending Prescriptions Disp Refills   pioglitazone  (ACTOS ) 30 MG tablet 90 tablet 1    Sig: Take 1 tablet (30 mg total) by mouth daily. THIS REPLACES JANUVIA      Endocrinology:  Diabetes - Glitazones - pioglitazone  Passed - 09/05/2024 12:16 PM      Passed - HBA1C is between 0 and 7.9 and within 180 days    Hgb A1c MFr Bld  Date Value Ref Range Status  06/12/2024 6.1 (H) <5.7 % Final    Comment:    For someone without known diabetes, a hemoglobin  A1c value between 5.7% and 6.4% is consistent with prediabetes and should be confirmed with a  follow-up test. . For someone with known diabetes, a value <7% indicates that their diabetes is well controlled. A1c targets should be individualized based on duration of diabetes, age, comorbid conditions, and other considerations. . This assay result is consistent with an increased risk of diabetes. . Currently, no consensus exists regarding use of hemoglobin A1c for diagnosis of diabetes for children. SABRA Amy - Valid encounter within last 6 months    Recent Outpatient Visits           1 month ago Cellulitis of right hand   [place] [facility] Duanne Butler DASEN, MD   2 months ago Diabetes mellitus without complication [facility])   [place] [facility] Duanne Butler DASEN, MD   7 months ago Acute midline low back pain without sciatica   [place] [facility] Kayla Jeoffrey RAMAN, FNP   8 months ago Pure hypercholesterolemia   [place] [facility] Duanne Butler DASEN, MD   1 year ago Controlled type 2 diabetes mellitus without complication, without long-term current use of insulin [facility])   [place] [facility] [place], Butler DASEN, MD

## 2024-09-07 ENCOUNTER — Other Ambulatory Visit: Payer: Self-pay | Admitting: Family Medicine

## 2024-09-09 ENCOUNTER — Telehealth: Payer: Self-pay

## 2024-09-09 ENCOUNTER — Other Ambulatory Visit: Payer: Self-pay

## 2024-09-09 DIAGNOSIS — I10 Essential (primary) hypertension: Secondary | ICD-10-CM

## 2024-09-09 MED ORDER — SPIRONOLACTONE 25 MG PO TABS: 25.0000 mg | ORAL_TABLET | Freq: Every day | ORAL | 0 refills | Status: AC

## 2024-09-09 NOTE — Telephone Encounter (Signed)
 Sent in medication

## 2024-09-09 NOTE — Telephone Encounter (Signed)
 Prescription Request  09/09/2024  LOV: 07/22/24  What is the name of the medication or equipment? spironolactone  (ALDACTONE ) 25 MG tablet [[id number]]   Have you contacted your pharmacy to request a refill? Yes   Which pharmacy would you like this sent to?  [id number] [address], [place] - [address] [place] [address] Phone: [phone] Fax: [phone]    Patient notified that their request is being sent to the clinical staff for review and that they should receive a response within 2 business days.   Please advise at [facility] [phone]

## 2024-10-14 ENCOUNTER — Other Ambulatory Visit: Payer: Self-pay | Admitting: Family Medicine

## 2024-12-11 ENCOUNTER — Other Ambulatory Visit

## 2024-12-13 ENCOUNTER — Ambulatory Visit: Admitting: Family Medicine

## 2025-06-18 ENCOUNTER — Other Ambulatory Visit

## 2025-06-20 ENCOUNTER — Encounter: Admitting: Family Medicine
